# Patient Record
Sex: Male | Born: 1965 | Race: White | Hispanic: No | Marital: Married | Smoking: Never smoker
Health system: Southern US, Community
[De-identification: ages and names within clinical notes are randomized; demographics above are authoritative.]

## PROBLEM LIST (undated history)

## (undated) DIAGNOSIS — I10 Essential (primary) hypertension: Secondary | ICD-10-CM

## (undated) DIAGNOSIS — M1990 Unspecified osteoarthritis, unspecified site: Secondary | ICD-10-CM

## (undated) HISTORY — PX: KNEE ARTHROSCOPY W/ MENISCAL REPAIR: SHX1877

## (undated) HISTORY — DX: Essential (primary) hypertension: I10

## (undated) HISTORY — DX: Unspecified osteoarthritis, unspecified site: M19.90

---

## 2005-01-25 ENCOUNTER — Ambulatory Visit: Payer: Self-pay | Admitting: Internal Medicine

## 2005-01-28 ENCOUNTER — Ambulatory Visit: Payer: Self-pay

## 2005-02-19 ENCOUNTER — Ambulatory Visit: Payer: Self-pay | Admitting: Internal Medicine

## 2005-06-20 ENCOUNTER — Ambulatory Visit: Payer: Self-pay | Admitting: Internal Medicine

## 2006-12-29 ENCOUNTER — Encounter: Payer: Self-pay | Admitting: *Deleted

## 2006-12-29 DIAGNOSIS — I10 Essential (primary) hypertension: Secondary | ICD-10-CM

## 2008-03-29 ENCOUNTER — Ambulatory Visit: Payer: Self-pay | Admitting: Internal Medicine

## 2008-03-29 LAB — CONVERTED CEMR LAB
ALT: 97 units/L — ABNORMAL HIGH (ref 0–53)
AST: 55 units/L — ABNORMAL HIGH (ref 0–37)
Basophils Absolute: 0 10*3/uL (ref 0.0–0.1)
Bilirubin, Direct: 0.2 mg/dL (ref 0.0–0.3)
Calcium: 9.9 mg/dL (ref 8.4–10.5)
Chloride: 102 meq/L (ref 96–112)
Cholesterol: 199 mg/dL (ref 0–200)
Eosinophils Absolute: 0.1 10*3/uL (ref 0.0–0.7)
GFR calc Af Amer: 94 mL/min
GFR calc non Af Amer: 78 mL/min
HCT: 45.9 % (ref 39.0–52.0)
HDL: 35.4 mg/dL — ABNORMAL LOW (ref >39.0)
Hemoglobin: 15.7 g/dL (ref 13.0–17.0)
Leukocytes, UA: NEGATIVE
Lymphocytes Relative: 29.1 % (ref 12.0–46.0)
MCHC: 34.2 g/dL (ref 30.0–36.0)
MCV: 87.3 fL (ref 78.0–100.0)
Monocytes Relative: 8.9 % (ref 3.0–12.0)
Neutro Abs: 4.7 10*3/uL (ref 1.4–7.7)
Nitrite: NEGATIVE
Platelets: 285 10*3/uL (ref 150–400)
RDW: 12.5 % (ref 11.5–14.6)
Specific Gravity, Urine: 1.02 (ref 1.000–1.03)
TSH: 2.19 microintl units/mL (ref 0.35–5.50)
Total Bilirubin: 1.2 mg/dL (ref 0.3–1.2)
Total CHOL/HDL Ratio: 5.6
Triglycerides: 136 mg/dL (ref 0–149)

## 2008-04-04 ENCOUNTER — Ambulatory Visit: Payer: Self-pay | Admitting: Internal Medicine

## 2008-04-04 DIAGNOSIS — M1990 Unspecified osteoarthritis, unspecified site: Secondary | ICD-10-CM | POA: Insufficient documentation

## 2008-04-04 DIAGNOSIS — R945 Abnormal results of liver function studies: Secondary | ICD-10-CM | POA: Insufficient documentation

## 2008-04-04 DIAGNOSIS — M25569 Pain in unspecified knee: Secondary | ICD-10-CM | POA: Insufficient documentation

## 2008-04-04 DIAGNOSIS — E785 Hyperlipidemia, unspecified: Secondary | ICD-10-CM | POA: Insufficient documentation

## 2008-04-04 DIAGNOSIS — R7309 Other abnormal glucose: Secondary | ICD-10-CM

## 2008-04-04 DIAGNOSIS — K219 Gastro-esophageal reflux disease without esophagitis: Secondary | ICD-10-CM | POA: Insufficient documentation

## 2008-08-01 ENCOUNTER — Ambulatory Visit: Payer: Self-pay | Admitting: Internal Medicine

## 2008-08-01 LAB — CONVERTED CEMR LAB
Alkaline Phosphatase: 52 units/L (ref 39–117)
Bilirubin, Direct: 0.1 mg/dL (ref 0.0–0.3)
Calcium: 8.9 mg/dL (ref 8.4–10.5)
Creatinine, Ser: 1 mg/dL (ref 0.4–1.5)
Direct LDL: 164.4 mg/dL
GFR calc non Af Amer: 86.4 mL/min (ref >60)
Hgb A1c MFr Bld: 6 % (ref 4.6–6.5)
Sodium: 141 meq/L (ref 135–145)
Total Protein: 7.1 g/dL (ref 6.0–8.3)
Triglycerides: 203 mg/dL — ABNORMAL HIGH (ref 0.0–149.0)
VLDL: 40.6 mg/dL — ABNORMAL HIGH (ref 0.0–40.0)

## 2008-08-05 ENCOUNTER — Ambulatory Visit: Payer: Self-pay | Admitting: Internal Medicine

## 2009-02-16 ENCOUNTER — Ambulatory Visit: Payer: Self-pay | Admitting: Internal Medicine

## 2009-02-16 LAB — CONVERTED CEMR LAB
AST: 46 units/L — ABNORMAL HIGH (ref 0–37)
Alkaline Phosphatase: 70 units/L (ref 39–117)
Calcium: 9.6 mg/dL (ref 8.4–10.5)
Chloride: 102 meq/L (ref 96–112)
HDL: 34.5 mg/dL — ABNORMAL LOW (ref >39.00)
Total CHOL/HDL Ratio: 8
Triglycerides: 223 mg/dL — ABNORMAL HIGH (ref 0.0–149.0)

## 2009-02-21 ENCOUNTER — Ambulatory Visit: Payer: Self-pay | Admitting: Internal Medicine

## 2009-02-24 ENCOUNTER — Encounter: Admission: RE | Admit: 2009-02-24 | Discharge: 2009-02-24 | Payer: Self-pay | Admitting: Internal Medicine

## 2009-03-02 ENCOUNTER — Encounter: Payer: Self-pay | Admitting: Internal Medicine

## 2009-08-01 ENCOUNTER — Ambulatory Visit: Payer: Self-pay | Admitting: Internal Medicine

## 2009-08-01 LAB — CONVERTED CEMR LAB
ALT: 39 units/L (ref 0–53)
AST: 21 units/L (ref 0–37)
Albumin: 4 g/dL (ref 3.5–5.2)
Alkaline Phosphatase: 66 units/L (ref 39–117)
BUN: 12 mg/dL (ref 6–23)
Bilirubin, Direct: 0.1 mg/dL (ref 0.0–0.3)
Creatinine, Ser: 1.1 mg/dL (ref 0.4–1.5)
Direct LDL: 142.7 mg/dL
Ferritin: 119.4 ng/mL (ref 22.0–322.0)
HCV Ab: NEGATIVE
HDL: 41.5 mg/dL (ref >39.00)
Hep A IgM: NEGATIVE
Hepatitis B Surface Ag: NEGATIVE
Hgb A1c MFr Bld: 6.1 % (ref 4.6–6.5)
Saturation Ratios: 23.7 % (ref 20.0–50.0)
Sodium: 139 meq/L (ref 135–145)
Total Bilirubin: 0.8 mg/dL (ref 0.3–1.2)
Transferrin: 234.9 mg/dL (ref 212.0–360.0)
Triglycerides: 205 mg/dL — ABNORMAL HIGH (ref 0.0–149.0)

## 2009-08-03 ENCOUNTER — Ambulatory Visit: Payer: Self-pay | Admitting: Internal Medicine

## 2010-04-30 ENCOUNTER — Ambulatory Visit: Payer: Self-pay | Admitting: Internal Medicine

## 2010-04-30 LAB — CONVERTED CEMR LAB
ALT: 46 units/L (ref 0–53)
AST: 27 units/L (ref 0–37)
Albumin: 4.2 g/dL (ref 3.5–5.2)
Alkaline Phosphatase: 66 units/L (ref 39–117)
BUN: 12 mg/dL (ref 6–23)
Basophils Absolute: 0 10*3/uL (ref 0.0–0.1)
Basophils Relative: 0.2 % (ref 0.0–3.0)
Bilirubin Urine: NEGATIVE
Bilirubin, Direct: 0.1 mg/dL (ref 0.0–0.3)
Blood, UA: NEGATIVE
CO2: 26 meq/L (ref 19–32)
Calcium: 9.5 mg/dL (ref 8.4–10.5)
Chloride: 102 meq/L (ref 96–112)
Cholesterol: 221 mg/dL — ABNORMAL HIGH (ref 0–200)
Creatinine, Ser: 1.1 mg/dL (ref 0.4–1.5)
Direct LDL: 166 mg/dL
Eosinophils Absolute: 0.1 10*3/uL (ref 0.0–0.7)
Eosinophils Relative: 0.8 % (ref 0.0–5.0)
GFR calc non Af Amer: 77.6 mL/min (ref >60.00)
Glucose, Bld: 90 mg/dL (ref 70–99)
HCT: 45.7 % (ref 39.0–52.0)
HDL: 41.5 mg/dL (ref >39.00)
Hemoglobin: 15.7 g/dL (ref 13.0–17.0)
Ketones, ur: NEGATIVE mg/dL
Leukocytes, UA: NEGATIVE
Lymphocytes Relative: 19.6 % (ref 12.0–46.0)
Lymphs Abs: 1.7 10*3/uL (ref 0.7–4.0)
MCHC: 34.4 g/dL (ref 30.0–36.0)
MCV: 88.6 fL (ref 78.0–100.0)
Monocytes Absolute: 0.7 10*3/uL (ref 0.1–1.0)
Monocytes Relative: 7.9 % (ref 3.0–12.0)
Neutro Abs: 6.3 10*3/uL (ref 1.4–7.7)
Neutrophils Relative %: 71.5 % (ref 43.0–77.0)
Nitrite: NEGATIVE
PSA: 0.74 ng/mL (ref 0.10–4.00)
Platelets: 276 10*3/uL (ref 150.0–400.0)
Potassium: 4.8 meq/L (ref 3.5–5.1)
RBC: 5.16 M/uL (ref 4.22–5.81)
RDW: 13.4 % (ref 11.5–14.6)
Sodium: 138 meq/L (ref 135–145)
Specific Gravity, Urine: 1.02 (ref 1.000–1.030)
TSH: 1.47 microintl units/mL (ref 0.35–5.50)
Total Bilirubin: 0.6 mg/dL (ref 0.3–1.2)
Total CHOL/HDL Ratio: 5
Total Protein, Urine: NEGATIVE mg/dL
Total Protein: 7.4 g/dL (ref 6.0–8.3)
Triglycerides: 136 mg/dL (ref 0.0–149.0)
Urine Glucose: NEGATIVE mg/dL
Urobilinogen, UA: 0.2 (ref 0.0–1.0)
VLDL: 27.2 mg/dL (ref 0.0–40.0)
WBC: 8.9 10*3/uL (ref 4.5–10.5)
pH: 6 (ref 5.0–8.0)

## 2010-05-04 ENCOUNTER — Ambulatory Visit: Payer: Self-pay | Admitting: Internal Medicine

## 2010-05-04 ENCOUNTER — Encounter: Payer: Self-pay | Admitting: Internal Medicine

## 2010-05-04 DIAGNOSIS — D485 Neoplasm of uncertain behavior of skin: Secondary | ICD-10-CM | POA: Insufficient documentation

## 2010-05-04 DIAGNOSIS — L570 Actinic keratosis: Secondary | ICD-10-CM

## 2010-06-03 ENCOUNTER — Encounter: Payer: Self-pay | Admitting: Orthopedic Surgery

## 2010-06-12 NOTE — Assessment & Plan Note (Signed)
Summary: f/u appt/cd   Vital Signs:  Patient profile:   45 year old male Height:      68 inches Weight:      229 pounds BMI:     34.95 Temp:     97.2 degrees F oral Pulse rate:   83 / minute BP sitting:   132 / 90  (left arm)  Vitals Entered By: Tora Perches (August 03, 2009 7:56 AM) CC: f/u Is Patient Diabetic? No   CC:  f/u.  History of Present Illness: The patient presents for a follow up of hypertension, OA, hyperlipidemia   Preventive Screening-Counseling & Management  Alcohol-Tobacco     Smoking Status: never  Current Medications (verified): 1)  Lisinopril 40 Mg  Tabs (Lisinopril) .... Once Daily 2)  Crestor 20 Mg  Tabs (Rosuvastatin Calcium) .... Once Daily 3)  Prevacid 30 Mg  Cpdr (Lansoprazole) .... Once Daily 4)  Vitamin D3 1000 Unit  Tabs (Cholecalciferol) .Marland Kitchen.. 1 By Mouth Daily 5)  Nabumetone 750 Mg  Tabs (Nabumetone) .Marland Kitchen.. 1 By Mouth Two Times A Day Pc Prn  Allergies: 1)  Tramadol Hcl (Tramadol Hcl)  Past History:  Past Medical History: Last updated: 02/21/2009  Hypertension Knee Pain Hyperlipidemia Osteoarthritis B knee GERD Elev LFTs 2010  Social History: Last updated: 02/21/2009 Occupation: retiring from [employer] in 2010 Never Smoked Alcohol use-no Regular exercise-weekly  Review of Systems  The patient denies fever, chest pain, and abdominal pain.         working a lot  Physical Exam  General:  Well-developed,well-nourished,in no acute distress; alert,appropriate and cooperative throughout examination Nose:  External nasal examination shows no deformity or inflammation. Nasal mucosa are pink and moist without lesions or exudates. Mouth:  Oral mucosa and oropharynx without lesions or exudates.  Teeth in good repair. Lungs:  Normal respiratory effort, chest expands symmetrically. Lungs are clear to auscultation, no crackles or wheezes. Heart:  Normal rate and regular rhythm. S1 and S2 normal without gallop, murmur, click, rub or  other extra sounds. Abdomen:  Bowel sounds positive,abdomen soft and non-tender without masses, organomegaly or hernias noted. Msk:  No deformity or scoliosis noted of thoracic or lumbar spine.  B knees less  tender w/ROM Extremities:  No clubbing, cyanosis, edema, or deformity noted with normal full range of motion of all joints.   Neurologic:  No cranial nerve deficits noted. Station and gait are normal. Plantar reflexes are down-going bilaterally. DTRs are symmetrical throughout. Sensory, motor and coordinative functions appear intact. Skin:  Intact without suspicious lesions or rashes Psych:  Cognition and judgment appear intact. Alert and cooperative with normal attention span and concentration. No apparent delusions, illusions, hallucinations   Impression & Recommendations:  Problem # 1:  HYPERTENSION (ICD-401.9) Assessment Improved  His updated medication list for this problem includes:    Lisinopril 40 Mg Tabs (Lisinopril) ..... Once daily  Problem # 2:  OSTEOARTHRITIS (ICD-715.90) Assessment: Unchanged  His updated medication list for this problem includes:    Nabumetone 750 Mg Tabs (Nabumetone) .Marland Kitchen... 1 by mouth two times a day pc prn  Problem # 3:  GERD (ICD-530.81) Assessment: Deteriorated  His updated medication list for this problem includes:    Prevacid 30 Mg Cpdr (Lansoprazole) ..... Once daily  Problem # 4:  HYPERLIPIDEMIA (ICD-272.4) Assessment: Improved  His updated medication list for this problem includes:    Crestor 20 Mg Tabs (Rosuvastatin calcium) ..... Once daily  Problem # 5:  ABNORMAL LIVER FUNCTION TESTS (ICD-794.8) Assessment: Improved  The labs were reviewed with the patient.   Complete Medication List: 1)  Lisinopril 40 Mg Tabs (Lisinopril) .... Once daily 2)  Crestor 20 Mg Tabs (Rosuvastatin calcium) .... Once daily 3)  Prevacid 30 Mg Cpdr (Lansoprazole) .... Once daily 4)  Vitamin D3 1000 Unit Tabs (Cholecalciferol) .Marland Kitchen.. 1 by mouth daily 5)   Nabumetone 750 Mg Tabs (Nabumetone) .Marland Kitchen.. 1 by mouth two times a day pc prn  Patient Instructions: 1)  Please schedule a follow-up appointment in 6 months well w/labs.

## 2010-06-14 NOTE — Assessment & Plan Note (Signed)
Summary: Cpx/#cd   Vital Signs:  Patient profile:   45 year old male Height:      68 inches Weight:      225 pounds BMI:     34.33 Temp:     98.0 degrees F oral Pulse rate:   80 / minute Pulse rhythm:   regular Resp:     16 per minute BP sitting:   134 / 90  (left arm) Cuff size:   regular  Vitals Entered By: Lanier Prude, CMA(AAMA) (May 04, 2010 9:30 AM) CC: CPX Is Patient Diabetic? No   CC:  CPX.  History of Present Illness: The patient presents for a preventive health examination  Ran out of med 2 d ago  Current Medications (verified): 1)  Lisinopril 40 Mg  Tabs (Lisinopril) .... Once Daily 2)  Crestor 20 Mg  Tabs (Rosuvastatin Calcium) .... Once Daily 3)  Prevacid 30 Mg  Cpdr (Lansoprazole) .... Once Daily 4)  Vitamin D3 1000 Unit  Tabs (Cholecalciferol) .Marland Kitchen.. 1 By Mouth Daily 5)  Nabumetone 750 Mg  Tabs (Nabumetone) .Marland Kitchen.. 1 By Mouth Two Times A Day Pc Prn  Allergies (verified): 1)  Tramadol Hcl (Tramadol Hcl)  Past History:  Past Medical History: Last updated: 02/21/2009  Hypertension Knee Pain Hyperlipidemia Osteoarthritis B knee GERD Elev LFTs 2010  Past Surgical History: Last updated: 04/04/2008 L knee arthrosc.  Social History: Last updated: 02/21/2009 Occupation: retiring from [employer] in 2010 Never Smoked Alcohol use-no Regular exercise-weekly  Physical Exam  General:  Well-developed,well-nourished,in no acute distress; alert,appropriate and cooperative throughout examination Head:  Normocephalic and atraumatic without obvious abnormalities. No apparent alopecia or balding. Eyes:  No corneal or conjunctival inflammation noted. EOMI. Perrla. Ears:  External ear exam shows no significant lesions or deformities.  Otoscopic examination reveals clear canals, tympanic membranes are intact bilaterally without bulging, retraction, inflammation or discharge. Hearing is grossly normal bilaterally. Nose:  External nasal examination shows no  deformity or inflammation. Nasal mucosa are pink and moist without lesions or exudates. Mouth:  Oral mucosa and oropharynx without lesions or exudates.  Teeth in good repair. Neck:  No deformities, masses, or tenderness noted. Chest Wall:  No deformities, masses, tenderness or gynecomastia noted. Lungs:  Normal respiratory effort, chest expands symmetrically. Lungs are clear to auscultation, no crackles or wheezes. Heart:  Normal rate and regular rhythm. S1 and S2 normal without gallop, murmur, click, rub or other extra sounds. Abdomen:  Bowel sounds positive,abdomen soft and non-tender without masses, organomegaly or hernias noted. Genitalia:  self nl exam Msk:  No deformity or scoliosis noted of thoracic or lumbar spine.  B knees NT w/ROM Pulses:  R and L carotid,radial,femoral,dorsalis pedis and posterior tibial pulses are full and equal bilaterally Extremities:  No clubbing, cyanosis, edema, or deformity noted with normal full range of motion of all joints.   Neurologic:  No cranial nerve deficits noted. Station and gait are normal. Plantar reflexes are down-going bilaterally. DTRs are symmetrical throughout. Sensory, motor and coordinative functions appear intact. Skin:  AKs on face Moles on upper body w/irreg color Cervical Nodes:  No lymphadenopathy noted Inguinal Nodes:  No significant adenopathy Psych:  Cognition and judgment appear intact. Alert and cooperative with normal attention span and concentration. No apparent delusions, illusions, hallucinations   Impression & Recommendations:  Problem # 1:  ROUTINE GENERAL MEDICAL EXAM@HEALTH  CARE FACL (ICD-V70.0) Assessment New Health and age related issues were discussed. Available screening tests and vaccinations were discussed as well. Healthy life style including  good diet and exercise was discussed.  The labs were reviewed with the patient.  Orders: EKG w/ Interpretation (93000)ok  Problem # 2:  NEOPLASM OF UNCERTAIN BEHAVIOR OF  SKIN (ICD-238.2) Assessment: New Skin biopsy   Problem # 3:  ACTINIC KERATOSIS (ICD-702.0) Assessment: New  Procedure: cryo Indication: AK(s) Risks incl. scar(s), incomplete removal, ect.  and benefits discussed     3  lesion(s): 2 on forehead and one on R cheek was/were treated with liqid N2 in usual fasion.  Tolerated well. Compl. none. Wound care instructions given.  Orders: Cryotherapy/Destruction benign or premalignant lesion (1st lesion)  (17000) Cryotherapy/Destruction benign or premalignant lesion (2nd-14th lesions) (17003)  Complete Medication List: 1)  Lisinopril 40 Mg Tabs (Lisinopril) .... Once daily 2)  Crestor 20 Mg Tabs (Rosuvastatin calcium) .... Once daily 3)  Prevacid 30 Mg Cpdr (Lansoprazole) .... Once daily 4)  Vitamin D3 1000 Unit Tabs (Cholecalciferol) .Marland Kitchen.. 1 by mouth daily 5)  Nabumetone 750 Mg Tabs (Nabumetone) .Marland Kitchen.. 1 by mouth two times a day pc prn  Patient Instructions: 1)  Please schedule a follow-up appointment in 1 year well w/labs. 2)  Skin biopsy w/me in 2 months  nPrescriptions: PREVACID 30 MG  CPDR (LANSOPRAZOLE) once daily  #90 x 3   Entered and Authorized by:   Tresa Garter MD   Signed by:   Tresa Garter MD on 05/04/2010   Method used:   Electronically to        [facility] [phone]* (retail)       [address]       [place], [place]  [address]       Ph: [phone]       Fax: [phone]   RxID:   [id number] CRESTOR 20 MG  TABS (ROSUVASTATIN CALCIUM) once daily  #90 x 3   Entered and Authorized by:   Tresa Garter MD   Signed by:   Tresa Garter MD on 05/04/2010   Method used:   Electronically to        [facility]. [phone]* (retail)       [address]       [place], [place]  [address]       Ph: [phone]       Fax: [phone]   RxID:   [id number] LISINOPRIL 40 MG  TABS (LISINOPRIL) once daily  #90 x 3   Entered and  Authorized by:   Tresa Garter MD   Signed by:   Tresa Garter MD on 05/04/2010   Method used:   Electronically to        [facility] [phone]* (retail)       [address]       [place], [place]  [address]       Ph: [phone]       Fax: [phone]   RxID:   [id number]    Orders Added: 1)  EKG w/ Interpretation [93000] 2)  Est. Patient 40-64 years [99396] 3)  Cryotherapy/Destruction benign or premalignant lesion (1st lesion)  [17000] 4)  Cryotherapy/Destruction benign or premalignant lesion (2nd-14th lesions) [17003]    Contraindications/Deferment of Procedures/Staging:    Test/Procedure: FLU VAX    Reason for deferment: patient declined

## 2010-07-04 ENCOUNTER — Encounter: Payer: Self-pay | Admitting: Internal Medicine

## 2010-07-04 ENCOUNTER — Other Ambulatory Visit: Payer: Self-pay | Admitting: Internal Medicine

## 2010-07-04 ENCOUNTER — Ambulatory Visit (INDEPENDENT_AMBULATORY_CARE_PROVIDER_SITE_OTHER): Payer: BC Managed Care – PPO | Admitting: Internal Medicine

## 2010-07-04 DIAGNOSIS — D485 Neoplasm of uncertain behavior of skin: Secondary | ICD-10-CM

## 2010-07-10 NOTE — Assessment & Plan Note (Signed)
Summary: SKIN/BX/NWS #   Vital Signs:  Patient profile:   45 year old male Height:      68 inches Weight:      220 pounds BMI:     33.57 Temp:     98.6 degrees F oral Pulse rate:   88 / minute Pulse rhythm:   regular Resp:     16 per minute BP sitting:   112 / 80  (left arm) Cuff size:   regular  Vitals Entered By: Lanier Prude, CMA(AAMA) (July 04, 2010 9:43 AM)  Procedure Note  Mole Biopsy/Removal: The patient complains of changing mole. Indication: changing lesion Consent signed: yes  Procedure # 1: shave biopsy    Size (in cm): 0.6 x 0.6    Region: upper    Location: R mid-trap    Comment: Risks including but not limited by incomplete procedure, bleeding, infection, recurrence were discussed with the patient. Consent form was signed. Tolerated well. Complicatons - none.     Instrument used: dermablade    Anesthesia: 0.5 ml 1% lidocaine w/epinephrine  Procedure # 2: shave biopsy    Size (in cm): 0.3 x 0.3    Region: anterior    Location: R chest    Comment: removed identically #3 0.6x0.6 mm growth on L dist lat shin    Instrument used: same    Anesthesia: 0.5 ml 1% lidocaine w/epinephrine  Cleaned and prepped with: alcohol and betadine Wound dressing: neosporin Instructions: daily dressing changes  CC: multiple shave skin biopsies Is Patient Diabetic? No   CC:  multiple shave skin biopsies.  History of Present Illness: Here for skin biopsy   Current Medications (verified): 1)  Lisinopril 40 Mg  Tabs (Lisinopril) .... Once Daily 2)  Crestor 20 Mg  Tabs (Rosuvastatin Calcium) .... Once Daily 3)  Prevacid 30 Mg  Cpdr (Lansoprazole) .... Once Daily 4)  Vitamin D3 1000 Unit  Tabs (Cholecalciferol) .Marland Kitchen.. 1 By Mouth Daily 5)  Nabumetone 750 Mg  Tabs (Nabumetone) .Marland Kitchen.. 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 Once daily 2)  Crestor 20 Mg Tabs (Rosuvastatin calcium) .... Once daily 3)  Prevacid 30 Mg Cpdr (Lansoprazole) .... Once daily 4)  Vitamin D3 1000 Unit Tabs (Cholecalciferol) .Marland Kitchen.. 1 by mouth daily 5)  Nabumetone 750 Mg Tabs (Nabumetone) .Marland Kitchen.. 1 by mouth two times a day pc prn   Orders Added: 1)  Shave Skin Lesion 0.6-1.0 cm/trunk/arm/leg [11301] 2)  Shave Skin Lesion 0.6-1.0 cm/trunk/arm/leg [11301] 3)  Shave Skin Lesion < 0.5 cm/trunk/arm/leg [11300]

## 2010-07-10 NOTE — Miscellaneous (Signed)
Summary: Consent for Skin BX / [facility]  Consent for Skin BX / [facility]   Imported By: Lennie Odor 07/05/2010 14:21:25  _____________________________________________________________________  External Attachment:    Type:   Image     Comment:   External Document

## 2010-12-04 ENCOUNTER — Telehealth: Payer: Self-pay | Admitting: *Deleted

## 2010-12-04 NOTE — Telephone Encounter (Signed)
Pt is requesting something for sleep. Please advise

## 2010-12-04 NOTE — Telephone Encounter (Signed)
Use Tylenol PM or Valerian root prn. OV if problems Thx

## 2010-12-05 NOTE — Telephone Encounter (Signed)
Informed pt .

## 2011-06-01 ENCOUNTER — Other Ambulatory Visit: Payer: Self-pay | Admitting: Internal Medicine

## 2012-02-26 ENCOUNTER — Telehealth: Payer: Self-pay | Admitting: Internal Medicine

## 2012-02-26 DIAGNOSIS — M25579 Pain in unspecified ankle and joints of unspecified foot: Secondary | ICD-10-CM

## 2012-02-26 NOTE — Telephone Encounter (Signed)
The patient called and is hoping to get a referral to an orthopedic specialist regarding a ankle injury.  Patient's callback is [phone]

## 2012-02-27 NOTE — Telephone Encounter (Signed)
Ok Pls go ahead [employer]

## 2012-03-10 ENCOUNTER — Other Ambulatory Visit: Payer: Self-pay | Admitting: *Deleted

## 2012-03-10 MED ORDER — LISINOPRIL 40 MG PO TABS: ORAL_TABLET | ORAL | Status: DC

## 2012-03-10 NOTE — Telephone Encounter (Signed)
R'cd fax from [facility] for refill of Lisinopril.

## 2012-04-06 ENCOUNTER — Other Ambulatory Visit: Payer: Self-pay | Admitting: Internal Medicine

## 2012-04-23 ENCOUNTER — Other Ambulatory Visit (INDEPENDENT_AMBULATORY_CARE_PROVIDER_SITE_OTHER): Payer: BC Managed Care – PPO

## 2012-04-23 ENCOUNTER — Ambulatory Visit (INDEPENDENT_AMBULATORY_CARE_PROVIDER_SITE_OTHER): Payer: BC Managed Care – PPO | Admitting: Internal Medicine

## 2012-04-23 ENCOUNTER — Encounter: Payer: Self-pay | Admitting: Internal Medicine

## 2012-04-23 VITALS — BP 122/90 | HR 96 | Temp 97.0°F | Resp 16 | Ht 69.0 in | Wt 236.0 lb

## 2012-04-23 DIAGNOSIS — I10 Essential (primary) hypertension: Secondary | ICD-10-CM

## 2012-04-23 DIAGNOSIS — Z0000 Encounter for general adult medical examination without abnormal findings: Secondary | ICD-10-CM | POA: Insufficient documentation

## 2012-04-23 DIAGNOSIS — E785 Hyperlipidemia, unspecified: Secondary | ICD-10-CM

## 2012-04-23 DIAGNOSIS — R7309 Other abnormal glucose: Secondary | ICD-10-CM

## 2012-04-23 LAB — URINALYSIS
Leukocytes, UA: NEGATIVE
Nitrite: NEGATIVE
Total Protein, Urine: NEGATIVE
Urine Glucose: NEGATIVE

## 2012-04-23 LAB — HEPATIC FUNCTION PANEL
ALT: 89 U/L — ABNORMAL HIGH (ref 0–53)
AST: 40 U/L — ABNORMAL HIGH (ref 0–37)
Albumin: 4.3 g/dL (ref 3.5–5.2)
Alkaline Phosphatase: 70 U/L (ref 39–117)

## 2012-04-23 LAB — CBC WITH DIFFERENTIAL/PLATELET
Basophils Absolute: 0 10*3/uL (ref 0.0–0.1)
Basophils Relative: 0.3 % (ref 0.0–3.0)
Eosinophils Absolute: 0.4 10*3/uL (ref 0.0–0.7)
HCT: 46.1 % (ref 39.0–52.0)
Lymphs Abs: 2.6 10*3/uL (ref 0.7–4.0)
MCV: 87.2 fl (ref 78.0–100.0)
Neutro Abs: 5.4 10*3/uL (ref 1.4–7.7)
Neutrophils Relative %: 57.3 % (ref 43.0–77.0)
Platelets: 267 10*3/uL (ref 150.0–400.0)

## 2012-04-23 LAB — LIPID PANEL
Total CHOL/HDL Ratio: 8
VLDL: 47.6 mg/dL — ABNORMAL HIGH (ref 0.0–40.0)

## 2012-04-23 LAB — BASIC METABOLIC PANEL
BUN: 11 mg/dL (ref 6–23)
CO2: 28 mEq/L (ref 19–32)

## 2012-04-23 LAB — TSH: TSH: 1.73 u[IU]/mL (ref 0.35–5.50)

## 2012-04-23 LAB — LDL CHOLESTEROL, DIRECT: Direct LDL: 195.8 mg/dL

## 2012-04-23 MED ORDER — VITAMIN D 1000 UNITS PO TABS: 1000.0000 [IU] | ORAL_TABLET | Freq: Every day | ORAL | Status: AC

## 2012-04-23 MED ORDER — LISINOPRIL 40 MG PO TABS: ORAL_TABLET | ORAL | Status: AC

## 2012-04-23 NOTE — Assessment & Plan Note (Signed)
We discussed age appropriate health related issues, including available/recomended screening tests and vaccinations. We discussed a need for adhering to healthy diet and exercise. Labs/EKG were reviewed/ordered. All questions were answered.   

## 2012-04-23 NOTE — Assessment & Plan Note (Signed)
Will check A1c

## 2012-04-23 NOTE — Progress Notes (Signed)
Subjective:    Patient ID: Jesse Stewart, male    DOB: 02-14-1966, 46 y.o.   MRN: [id number]  HPI  The patient is here for a wellness exam. The patient has been doing well overall without major physical or psychological issues going on lately.  The patient needs to address  chronic hypertension that has been well controlled with medicines  BP Readings from Last 3 Encounters:  04/23/12 122/90  07/04/10 112/80  05/04/10 134/90   Wt Readings from Last 3 Encounters:  04/23/12 236 lb (107.049 kg)  07/04/10 220 lb (99.791 kg)  05/04/10 225 lb (102.059 kg)     Review of Systems  Constitutional: Negative for appetite change, fatigue and unexpected weight change.  HENT: Negative for hearing loss, nosebleeds, congestion, sore throat, sneezing, trouble swallowing and neck pain.   Eyes: Negative for itching and visual disturbance.  Respiratory: Negative for cough.   Cardiovascular: Negative for chest pain, palpitations and leg swelling.  Gastrointestinal: Negative for nausea, diarrhea, blood in stool and abdominal distention.  Genitourinary: Negative for frequency and hematuria.  Musculoskeletal: Negative for myalgias, back pain, joint swelling and gait problem.  Skin: Negative for rash and wound.  Neurological: Negative for dizziness, tremors, speech difficulty and weakness.  Psychiatric/Behavioral: Negative for suicidal ideas, sleep disturbance, dysphoric mood and agitation. The patient is not nervous/anxious.        Objective:   Physical Exam  Constitutional: He is oriented to person, place, and time. He appears well-developed and well-nourished. No distress.  HENT:  Head: Normocephalic and atraumatic.  Right Ear: External ear normal.  Left Ear: External ear normal.  Nose: Nose normal.  Mouth/Throat: Oropharynx is clear and moist. No oropharyngeal exudate.  Eyes: Conjunctivae normal and EOM are normal. Pupils are equal, round, and reactive to light. Right eye exhibits no  discharge. Left eye exhibits no discharge. No scleral icterus.  Neck: Normal range of motion. Neck supple. No JVD present. No tracheal deviation present. No thyromegaly present.  Cardiovascular: Normal rate, regular rhythm, normal heart sounds and intact distal pulses.  Exam reveals no gallop and no friction rub.   No murmur heard. Pulmonary/Chest: Effort normal and breath sounds normal. No stridor. No respiratory distress. He has no wheezes. He has no rales. He exhibits no tenderness.  Abdominal: Soft. Bowel sounds are normal. He exhibits no distension and no mass. There is no tenderness. There is no rebound and no guarding.  Genitourinary: Rectum normal, prostate normal and penis normal. Guaiac negative stool. No penile tenderness.  Musculoskeletal: Normal range of motion. He exhibits no edema and no tenderness.  Lymphadenopathy:    He has no cervical adenopathy.  Neurological: He is alert and oriented to person, place, and time. He has normal reflexes. No cranial nerve deficit. He exhibits normal muscle tone. Coordination normal.  Skin: Skin is warm and dry. No rash noted. He is not diaphoretic. No erythema. No pallor.  Psychiatric: He has a normal mood and affect. His behavior is normal. Judgment and thought content normal.    Lab Results  Component Value Date   WBC 8.9 04/30/2010   HGB 15.7 04/30/2010   HCT 45.7 04/30/2010   PLT 276.0 04/30/2010   GLUCOSE 90 04/30/2010   CHOL 221* 04/30/2010   TRIG 136.0 04/30/2010   HDL 41.50 04/30/2010   LDLDIRECT 166.0 04/30/2010   LDLCALC 136* 03/29/2008   ALT 46 04/30/2010   AST 27 04/30/2010   NA 138 04/30/2010   K 4.8 04/30/2010   CL 102  04/30/2010   CREATININE 1.1 04/30/2010   BUN 12 04/30/2010   CO2 26 04/30/2010   TSH 1.47 04/30/2010   PSA 0.74 04/30/2010   HGBA1C 6.1 08/01/2009         Assessment & Plan:

## 2012-04-23 NOTE — Assessment & Plan Note (Signed)
Re-start Rx 

## 2012-04-23 NOTE — Assessment & Plan Note (Signed)
  On diet  

## 2012-06-27 ENCOUNTER — Other Ambulatory Visit: Payer: Self-pay

## 2012-12-07 ENCOUNTER — Encounter: Payer: Self-pay | Admitting: Internal Medicine

## 2012-12-07 ENCOUNTER — Ambulatory Visit (INDEPENDENT_AMBULATORY_CARE_PROVIDER_SITE_OTHER): Payer: BC Managed Care – PPO | Admitting: Internal Medicine

## 2012-12-07 VITALS — BP 128/80 | HR 80 | Temp 97.4°F | Resp 16 | Wt 238.0 lb

## 2012-12-07 DIAGNOSIS — K439 Ventral hernia without obstruction or gangrene: Secondary | ICD-10-CM | POA: Insufficient documentation

## 2012-12-07 DIAGNOSIS — K921 Melena: Secondary | ICD-10-CM

## 2012-12-07 MED ORDER — MELOXICAM 15 MG PO TABS: 15.0000 mg | ORAL_TABLET | Freq: Every day | ORAL | Status: DC | PRN

## 2012-12-07 NOTE — Progress Notes (Signed)
Subjective:    HPI  C/o abd wall protrusion and pain - worse C/o occ heartburn  C/o rectal bleeding off and on 3 months. Last bleed 1 mo ago  The patient needs to address  chronic hypertension that has been well controlled with medicines  BP Readings from Last 3 Encounters:  12/07/12 128/80  04/23/12 122/90  07/04/10 112/80   Wt Readings from Last 3 Encounters:  12/07/12 238 lb (107.956 kg)  04/23/12 236 lb (107.049 kg)  07/04/10 220 lb (99.791 kg)     Review of Systems  Constitutional: Negative for appetite change, fatigue and unexpected weight change.  HENT: Negative for hearing loss, nosebleeds, congestion, sore throat, sneezing, trouble swallowing and neck pain.   Eyes: Negative for itching and visual disturbance.  Respiratory: Negative for cough.   Cardiovascular: Negative for chest pain, palpitations and leg swelling.  Gastrointestinal: Positive for abdominal pain and blood in stool. Negative for nausea, diarrhea, abdominal distention and rectal pain.  Genitourinary: Negative for frequency and hematuria.  Musculoskeletal: Negative for myalgias, back pain, joint swelling and gait problem.  Skin: Negative for rash and wound.  Neurological: Negative for dizziness, tremors, speech difficulty and weakness.  Psychiatric/Behavioral: Negative for suicidal ideas, sleep disturbance, dysphoric mood and agitation. The patient is not nervous/anxious.        Objective:   Physical Exam  Constitutional: He is oriented to person, place, and time. He appears well-developed and well-nourished. No distress.  HENT:  Head: Normocephalic and atraumatic.  Right Ear: External ear normal.  Left Ear: External ear normal.  Nose: Nose normal.  Mouth/Throat: Oropharynx is clear and moist. No oropharyngeal exudate.  Eyes: Conjunctivae and EOM are normal. Pupils are equal, round, and reactive to light. Right eye exhibits no discharge. Left eye exhibits no discharge. No scleral icterus.   Neck: Normal range of motion. Neck supple. No JVD present. No tracheal deviation present. No thyromegaly present.  Cardiovascular: Normal rate, regular rhythm, normal heart sounds and intact distal pulses.  Exam reveals no gallop and no friction rub.   No murmur heard. Pulmonary/Chest: Effort normal and breath sounds normal. No stridor. No respiratory distress. He has no wheezes. He has no rales. He exhibits no tenderness.  Abdominal: Soft. Bowel sounds are normal. He exhibits mass (midline hernia above umbilicus). He exhibits no distension. There is no tenderness. There is no rebound and no guarding.  Genitourinary: Rectum normal and prostate normal. Guaiac negative stool. No penile tenderness.  Musculoskeletal: Normal range of motion. He exhibits no edema and no tenderness.  Lymphadenopathy:    He has no cervical adenopathy.  Neurological: He is alert and oriented to person, place, and time. He has normal reflexes. No cranial nerve deficit. He exhibits normal muscle tone. Coordination normal.  Skin: Skin is warm and dry. No rash noted. He is not diaphoretic. No erythema. No pallor.  Psychiatric: He has a normal mood and affect. His behavior is normal. Judgment and thought content normal.    Lab Results  Component Value Date   WBC 9.5 04/23/2012   HGB 15.7 04/23/2012   HCT 46.1 04/23/2012   PLT 267.0 04/23/2012   GLUCOSE 88 04/23/2012   CHOL 256* 04/23/2012   TRIG 238.0* 04/23/2012   HDL 33.50* 04/23/2012   LDLDIRECT 195.8 04/23/2012   LDLCALC 136* 03/29/2008   ALT 89* 04/23/2012   AST 40* 04/23/2012   NA 134* 04/23/2012   K 3.9 04/23/2012   CL 100 04/23/2012   CREATININE 1.1 04/23/2012  BUN 11 04/23/2012   CO2 28 04/23/2012   TSH 1.73 04/23/2012   PSA 0.74 04/30/2010   HGBA1C 6.3 04/23/2012         Assessment & Plan:

## 2012-12-07 NOTE — Assessment & Plan Note (Signed)
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

## 2012-12-07 NOTE — Assessment & Plan Note (Signed)
Will sch colon 

## 2012-12-08 ENCOUNTER — Encounter: Payer: Self-pay | Admitting: Gastroenterology

## 2012-12-18 ENCOUNTER — Encounter (INDEPENDENT_AMBULATORY_CARE_PROVIDER_SITE_OTHER): Payer: Self-pay | Admitting: General Surgery

## 2012-12-18 ENCOUNTER — Ambulatory Visit (INDEPENDENT_AMBULATORY_CARE_PROVIDER_SITE_OTHER): Payer: BC Managed Care – PPO | Admitting: General Surgery

## 2012-12-18 VITALS — BP 130/80 | HR 88 | Resp 18 | Ht 69.0 in | Wt 241.0 lb

## 2012-12-18 DIAGNOSIS — K439 Ventral hernia without obstruction or gangrene: Secondary | ICD-10-CM

## 2012-12-18 NOTE — Patient Instructions (Addendum)
We will check a CT scan of your abdomen to confirm your abdominal wall anatomy We will call you with the results You do have a belly button hernia; I believe the upper bulge is a hernia as well. It could a diastasis (thinning of muscle where the rectus muscles join together but not a true hernia)

## 2012-12-19 NOTE — Progress Notes (Signed)
Patient ID: Jesse Stewart, male   DOB: [birth year], 47 y.o.   MRN: [id number]  Chief Complaint  Patient presents with  . New Evaluation    eval for abd wall hernia    HPI Jesse Stewart is a 47 y.o. male.   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  The patient reports that he started noticing a bulge in his upper midline about 6-8 months ago. Initially it caused him some intermittent discomfort. Over the past several months he thinks the area has been getting larger. He states that it is tender when he presses on it; Otherwise it really doesn't cause him much pain. He denies any fever, chills, nausea, vomiting, diarrhea or constipation. He denies any prior abdominal surgeries. He states that he has gained a little bit of weight over the past 2 years. 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  Past Medical History  Diagnosis Date  . Hypertension   . Arthritis     Past Surgical History  Procedure Laterality Date  . Knee arthroscopy w/ meniscal repair      Family History  Problem Relation Age of Onset  . Cancer Father     brain    Social History History  Substance Use Topics  . Smoking status: Never Smoker   . Smokeless tobacco: Not on file  . Alcohol Use: Yes    Allergies  Allergen Reactions  . Tramadol Hcl     REACTION: nausea    Current Outpatient Prescriptions  Medication Sig Dispense Refill  . cholecalciferol (VITAMIN D) 1000 UNITS tablet Take 1 tablet (1,000 Units total) by mouth daily.  100 tablet  3  . lisinopril (PRINIVIL,ZESTRIL) 40 MG tablet TAKE 1 TABLET BY MOUTH EVERY DAY  90 tablet  3  . meloxicam (MOBIC) 15 MG tablet Take 1 tablet (15 mg total) by mouth daily as needed for pain.  90 tablet  1   No current facility-administered medications for this visit.    Review of Systems Review of Systems  Constitutional: Negative for fever, chills, appetite change and unexpected weight  change.  HENT: Negative for congestion and trouble swallowing.   Eyes: Negative for visual disturbance.  Respiratory: Negative for chest tightness and shortness of breath.   Cardiovascular: Negative for chest pain and leg swelling.       No PND, no orthopnea, no DOE  Gastrointestinal:       See HPI  Genitourinary: Negative for dysuria and hematuria.  Musculoskeletal: Negative.   Skin: Negative for rash.  Neurological: Negative for seizures and speech difficulty.  Hematological: Does not bruise/bleed easily.  Psychiatric/Behavioral: Negative for behavioral problems and confusion.    Blood pressure 130/80, pulse 88, resp. rate 18, height 5\' 9"  (1.753 m), weight 241 lb (109.317 kg).  Physical Exam Physical Exam  Vitals reviewed. Constitutional: He is oriented to person, place, and time. He appears well-developed and well-nourished. No distress.  obese  HENT:  Head: Normocephalic and atraumatic.  Right Ear: External ear normal.  Left Ear: External ear normal.  Eyes: Conjunctivae are normal. No scleral icterus.  Neck: Normal range of motion. Neck supple. No tracheal deviation present. No thyromegaly present.  Cardiovascular: Normal rate, normal heart sounds and intact distal pulses.   Pulmonary/Chest: Effort normal and breath sounds normal. No respiratory distress. He has no wheezes.  Abdominal: Soft. He exhibits no distension. There is no tenderness. There is no rebound and no  guarding.    Has about 1.5cm umbilical hernia reducible, soft, nt; has upper midline symmetric bulge with situp c/w rectus diastasis; however in mid upper midline there is a deep subcu mass/knot with what feels like a fascial defect that is more vertical/linear than wide. Nontender. Fixed. ?hernia  Musculoskeletal: Normal range of motion. He exhibits no edema and no tenderness.  Lymphadenopathy:    He has no cervical adenopathy.  Neurological: He is alert and oriented to person, place, and time. He exhibits  normal muscle tone.  Skin: Skin is warm and dry. No rash noted. He is not diaphoretic. No erythema. No pallor.  Psychiatric: He has a normal mood and affect. His behavior is normal. Judgment and thought content normal.    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 His upper midline he does have a symmetric bulge consistent with a rectus diastasis. However he does have what appears to be a possible upper midline fascial defect. I can palpate a deep subcutaneous knot in his upper midline with what feels like a surrounding fascial defect. His exam is a little bit limited due to his body habitus. We discussed the finding of umbilical hernia, rectus diastasis, as well as a possible upper midline hernia. We discussed what a hernia was. We also discussed how a rectus diastases is not a true hernia. Because his abdominal wall anatomy is slightly unclear with respect to a questionable hernia in the upper midline separate from his rectus diastases, I recommended getting a CT scan to further delineate his abdominal wall anatomy better. The patient has requested a CT scan later in the month because he is getting ready to get very busy at work. I explained that should be fine since he has minimal symptoms.   We did discuss the importance of weight loss. We did discuss that with any potential hernia repair the increased risk of recurrence when someone is obese.  His followup will be based on the results of his CT scan. All of his questions were asked and answered  Mary Sella. Andrey Campanile, MD, FACS General, Bariatric, & Minimally Invasive Surgery [facility], [place]        [employer] M 12/19/2012, 7:59 AM

## 2012-12-30 ENCOUNTER — Ambulatory Visit: Payer: BC Managed Care – PPO | Admitting: Gastroenterology

## 2012-12-30 ENCOUNTER — Encounter: Payer: Self-pay | Admitting: Gastroenterology

## 2013-01-01 ENCOUNTER — Ambulatory Visit: Payer: BC Managed Care – PPO | Admitting: Gastroenterology

## 2013-01-20 ENCOUNTER — Other Ambulatory Visit: Payer: BC Managed Care – PPO

## 2013-02-16 ENCOUNTER — Ambulatory Visit: Payer: BC Managed Care – PPO | Admitting: Gastroenterology

## 2013-03-18 ENCOUNTER — Other Ambulatory Visit: Payer: Self-pay

## 2013-09-14 ENCOUNTER — Encounter: Payer: Self-pay | Admitting: Internal Medicine

## 2013-09-14 ENCOUNTER — Ambulatory Visit (INDEPENDENT_AMBULATORY_CARE_PROVIDER_SITE_OTHER): Payer: BC Managed Care – PPO | Admitting: Internal Medicine

## 2013-09-14 ENCOUNTER — Other Ambulatory Visit (INDEPENDENT_AMBULATORY_CARE_PROVIDER_SITE_OTHER): Payer: BC Managed Care – PPO

## 2013-09-14 VITALS — BP 118/80 | HR 76 | Temp 98.2°F | Resp 16 | Ht 68.0 in | Wt 207.0 lb

## 2013-09-14 DIAGNOSIS — R7309 Other abnormal glucose: Secondary | ICD-10-CM

## 2013-09-14 DIAGNOSIS — Z0000 Encounter for general adult medical examination without abnormal findings: Secondary | ICD-10-CM

## 2013-09-14 DIAGNOSIS — I10 Essential (primary) hypertension: Secondary | ICD-10-CM

## 2013-09-14 DIAGNOSIS — E785 Hyperlipidemia, unspecified: Secondary | ICD-10-CM

## 2013-09-14 DIAGNOSIS — D485 Neoplasm of uncertain behavior of skin: Secondary | ICD-10-CM

## 2013-09-14 DIAGNOSIS — R945 Abnormal results of liver function studies: Secondary | ICD-10-CM

## 2013-09-14 LAB — HEPATIC FUNCTION PANEL
ALT: 26 U/L (ref 0–53)
AST: 21 U/L (ref 0–37)
Albumin: 4.6 g/dL (ref 3.5–5.2)
Alkaline Phosphatase: 73 U/L (ref 39–117)
BILIRUBIN DIRECT: 0.1 mg/dL (ref 0.0–0.3)
Total Bilirubin: 0.7 mg/dL (ref 0.2–1.2)
Total Protein: 7.6 g/dL (ref 6.0–8.3)

## 2013-09-14 LAB — BASIC METABOLIC PANEL
BUN: 13 mg/dL (ref 6–23)
CHLORIDE: 102 meq/L (ref 96–112)
CO2: 26 mEq/L (ref 19–32)
Calcium: 9.5 mg/dL (ref 8.4–10.5)
Creatinine, Ser: 1.1 mg/dL (ref 0.4–1.5)
GFR: 78.99 mL/min (ref >60.00)
Glucose, Bld: 96 mg/dL (ref 70–99)
Potassium: 4.3 mEq/L (ref 3.5–5.1)
Sodium: 136 mEq/L (ref 135–145)

## 2013-09-14 LAB — CBC WITH DIFFERENTIAL/PLATELET
BASOS ABS: 0 10*3/uL (ref 0.0–0.1)
BASOS PCT: 0.4 % (ref 0.0–3.0)
Eosinophils Absolute: 0.2 10*3/uL (ref 0.0–0.7)
Eosinophils Relative: 2.8 % (ref 0.0–5.0)
HCT: 44.1 % (ref 39.0–52.0)
HEMOGLOBIN: 15 g/dL (ref 13.0–17.0)
LYMPHS PCT: 27.5 % (ref 12.0–46.0)
Lymphs Abs: 2.1 10*3/uL (ref 0.7–4.0)
MCHC: 34.1 g/dL (ref 30.0–36.0)
MCV: 86.6 fl (ref 78.0–100.0)
MONOS PCT: 6.2 % (ref 3.0–12.0)
Monocytes Absolute: 0.5 10*3/uL (ref 0.1–1.0)
NEUTROS PCT: 63.1 % (ref 43.0–77.0)
Neutro Abs: 4.9 10*3/uL (ref 1.4–7.7)
PLATELETS: 268 10*3/uL (ref 150.0–400.0)
RBC: 5.09 Mil/uL (ref 4.22–5.81)
RDW: 13.1 % (ref 11.5–15.5)
WBC: 7.8 10*3/uL (ref 4.0–10.5)

## 2013-09-14 LAB — LIPID PANEL
CHOL/HDL RATIO: 5
Cholesterol: 218 mg/dL — ABNORMAL HIGH (ref 0–200)
HDL: 41.4 mg/dL (ref >39.00)
LDL Cholesterol: 135 mg/dL — ABNORMAL HIGH (ref 0–99)
Triglycerides: 208 mg/dL — ABNORMAL HIGH (ref 0.0–149.0)
VLDL: 41.6 mg/dL — ABNORMAL HIGH (ref 0.0–40.0)

## 2013-09-14 LAB — TSH: TSH: 1.09 u[IU]/mL (ref 0.35–4.50)

## 2013-09-14 LAB — URINALYSIS
Bilirubin Urine: NEGATIVE
Hgb urine dipstick: NEGATIVE
Ketones, ur: NEGATIVE
LEUKOCYTES UA: NEGATIVE
Nitrite: NEGATIVE
Specific Gravity, Urine: 1.01 (ref 1.000–1.030)
Total Protein, Urine: NEGATIVE
URINE GLUCOSE: NEGATIVE
UROBILINOGEN UA: 0.2 (ref 0.0–1.0)
pH: 7 (ref 5.0–8.0)

## 2013-09-14 LAB — HEMOGLOBIN A1C: Hgb A1c MFr Bld: 5.9 % (ref 4.6–6.5)

## 2013-09-14 MED ORDER — MELOXICAM 15 MG PO TABS: 15.0000 mg | ORAL_TABLET | Freq: Every day | ORAL | Status: DC | PRN

## 2013-09-14 NOTE — Assessment & Plan Note (Signed)
Lost wt Re-check

## 2013-09-14 NOTE — Progress Notes (Signed)
Subjective:    HPI  The patient is here for a wellness exam. The patient has been doing well overall without major physical or psychological issues going on lately.  The patient needs to address  chronic hypertension that has been well controlled with medicines  BP Readings from Last 3 Encounters:  09/14/13 118/80  12/18/12 130/80  12/07/12 128/80   Wt Readings from Last 3 Encounters:  09/14/13 207 lb (93.895 kg)  12/18/12 241 lb (109.317 kg)  12/07/12 238 lb (107.956 kg)     Review of Systems  Constitutional: Negative for appetite change, fatigue and unexpected weight change.  HENT: Negative for congestion, hearing loss, nosebleeds, sneezing, sore throat and trouble swallowing.   Eyes: Negative for itching and visual disturbance.  Respiratory: Negative for cough.   Cardiovascular: Negative for chest pain, palpitations and leg swelling.  Gastrointestinal: Negative for nausea, diarrhea, blood in stool and abdominal distention.  Genitourinary: Negative for frequency and hematuria.  Musculoskeletal: Negative for back pain, gait problem, joint swelling, myalgias and neck pain.  Skin: Negative for rash and wound.  Neurological: Negative for dizziness, tremors, speech difficulty and weakness.  Psychiatric/Behavioral: Negative for suicidal ideas, sleep disturbance, dysphoric mood and agitation. The patient is not nervous/anxious.        Objective:   Physical Exam  Constitutional: He is oriented to person, place, and time. He appears well-developed and well-nourished. No distress.  HENT:  Head: Normocephalic and atraumatic.  Right Ear: External ear normal.  Left Ear: External ear normal.  Nose: Nose normal.  Mouth/Throat: Oropharynx is clear and moist. No oropharyngeal exudate.  Eyes: Conjunctivae and EOM are normal. Pupils are equal, round, and reactive to light. Right eye exhibits no discharge. Left eye exhibits no discharge. No scleral icterus.  Neck: Normal range of  motion. Neck supple. No JVD present. No tracheal deviation present. No thyromegaly present.  Cardiovascular: Normal rate, regular rhythm, normal heart sounds and intact distal pulses.  Exam reveals no gallop and no friction rub.   No murmur heard. Pulmonary/Chest: Effort normal and breath sounds normal. No stridor. No respiratory distress. He has no wheezes. He has no rales. He exhibits no tenderness.  Abdominal: Soft. Bowel sounds are normal. He exhibits no distension and no mass. There is no tenderness. There is no rebound and no guarding.  Genitourinary: Rectum normal, prostate normal and penis normal. Guaiac negative stool. No penile tenderness.  Musculoskeletal: Normal range of motion. He exhibits no edema and no tenderness.  Lymphadenopathy:    He has no cervical adenopathy.  Neurological: He is alert and oriented to person, place, and time. He has normal reflexes. No cranial nerve deficit. He exhibits normal muscle tone. Coordination normal.  Skin: Skin is warm and dry. No rash noted. He is not diaphoretic. No erythema. No pallor.  Psychiatric: He has a normal mood and affect. His behavior is normal. Judgment and thought content normal.  B testes nl LS spine and R cheek moles  Lab Results  Component Value Date   WBC 9.5 04/23/2012   HGB 15.7 04/23/2012   HCT 46.1 04/23/2012   PLT 267.0 04/23/2012   GLUCOSE 88 04/23/2012   CHOL 256* 04/23/2012   TRIG 238.0* 04/23/2012   HDL 33.50* 04/23/2012   LDLDIRECT 195.8 04/23/2012   LDLCALC 136* 03/29/2008   ALT 89* 04/23/2012   AST 40* 04/23/2012   NA 134* 04/23/2012   K 3.9 04/23/2012   CL 100 04/23/2012   CREATININE 1.1 04/23/2012   BUN 11 04/23/2012  CO2 28 04/23/2012   TSH 1.73 04/23/2012   PSA 0.74 04/30/2010   HGBA1C 6.3 04/23/2012         Assessment & Plan:

## 2013-09-14 NOTE — Assessment & Plan Note (Signed)
Labs

## 2013-09-14 NOTE — Patient Instructions (Signed)
Wt Readings from Last 3 Encounters:  09/14/13 207 lb (93.895 kg)  12/18/12 241 lb (109.317 kg)  12/07/12 238 lb (107.956 kg)

## 2013-09-14 NOTE — Assessment & Plan Note (Signed)
We discussed age appropriate health related issues, including available/recomended screening tests and vaccinations. We discussed a need for adhering to healthy diet and exercise. Labs/EKG were reviewed/ordered. All questions were answered.   

## 2013-09-14 NOTE — Assessment & Plan Note (Signed)
Continue with current prescription therapy as reflected on the Med list.  

## 2013-09-14 NOTE — Progress Notes (Signed)
Pre visit review using our clinic review tool, if applicable. No additional management support is needed unless otherwise documented below in the visit note. 

## 2013-09-14 NOTE — Assessment & Plan Note (Signed)
5/15 LS spine and R cheek Will bx

## 2013-09-14 NOTE — Assessment & Plan Note (Signed)
Lost wt °

## 2013-09-15 ENCOUNTER — Telehealth: Payer: Self-pay | Admitting: Internal Medicine

## 2013-09-15 NOTE — Telephone Encounter (Signed)
Relevant patient education assigned to patient using Emmi. ° °

## 2013-10-14 ENCOUNTER — Ambulatory Visit (INDEPENDENT_AMBULATORY_CARE_PROVIDER_SITE_OTHER): Payer: BC Managed Care – PPO | Admitting: Internal Medicine

## 2013-10-14 ENCOUNTER — Encounter: Payer: Self-pay | Admitting: Internal Medicine

## 2013-10-14 VITALS — BP 122/80 | HR 80 | Temp 98.2°F | Resp 16 | Wt 208.0 lb

## 2013-10-14 DIAGNOSIS — D485 Neoplasm of uncertain behavior of skin: Secondary | ICD-10-CM

## 2013-10-14 DIAGNOSIS — D489 Neoplasm of uncertain behavior, unspecified: Secondary | ICD-10-CM

## 2013-10-14 NOTE — Progress Notes (Signed)
Patient ID: Jesse Stewart, male   DOB: 01-14-1966, 48 y.o.   MRN: [id number]    Procedure Note :     Procedure :  Skin biopsy   Indication:  Changing mole (s ),  Suspicious lesion(s)   Risks including unsuccessful procedure , bleeding, infection, bruising, scar, a need for another complete procedure and others were explained to the patient in detail as well as the benefits. Informed consent was obtained and signed.   The patient was placed in a decubitus position.  Lesion #1 on  R cheek  measuring  6x6   mm   Skin over lesion #1  was prepped with Betadine and alcohol  and anesthetized with 1/2 cc of 2% lidocaine and epinephrine, using a 25-gauge 1 inch needle.  Shave biopsy with a sterile Dermablade was carried out in the usual fashion. Hyfrecator was used to destroy the rest of the lesion potentially left behind and for hemostasis. Band-Aid was applied with antibiotic ointment.    Lesion #2 on  Lower back   Measuring 8x8   mm   Skin over lesion #2  was prepped with Betadine and alcohol  and anesthetized with 1/2 cc of 2% lidocaine and epinephrine, using a 25-gauge 1 inch needle.  Shave biopsy with a sterile Dermablade was carried out in the usual fashion. Hyfrecator was used to destroy the rest of the lesion potentially left behind and for hemostasis. Band-Aid was applied with antibiotic ointment.  Lesion #3 on R temple near R eye  measuring 1x3  mm   Skin over lesion #3  was prepped with Betadine and alcohol  and anesthetized with 1/8 cc of 2% lidocaine and epinephrine, using a 25-gauge 1 inch needle.  Shave biopsy with a sterile Dermablade was carried out in the usual fashion. Hyfrecator was used to destroy the rest of the lesion potentially left behind and for hemostasis. Band-Aid was applied with antibiotic ointment. #3 was discarded  Tolerated well. Complications none.

## 2013-10-14 NOTE — Assessment & Plan Note (Signed)
See procedure 

## 2013-10-14 NOTE — Progress Notes (Signed)
Pre visit review using our clinic review tool, if applicable. No additional management support is needed unless otherwise documented below in the visit note. 

## 2014-09-16 ENCOUNTER — Encounter: Payer: Self-pay | Admitting: Internal Medicine

## 2014-09-16 ENCOUNTER — Other Ambulatory Visit (INDEPENDENT_AMBULATORY_CARE_PROVIDER_SITE_OTHER): Payer: BC Managed Care – PPO

## 2014-09-16 ENCOUNTER — Ambulatory Visit (INDEPENDENT_AMBULATORY_CARE_PROVIDER_SITE_OTHER): Payer: BC Managed Care – PPO | Admitting: Internal Medicine

## 2014-09-16 VITALS — BP 124/94 | HR 79 | Temp 98.2°F | Resp 16 | Ht 69.0 in | Wt 224.0 lb

## 2014-09-16 DIAGNOSIS — I10 Essential (primary) hypertension: Secondary | ICD-10-CM

## 2014-09-16 DIAGNOSIS — Z0000 Encounter for general adult medical examination without abnormal findings: Secondary | ICD-10-CM

## 2014-09-16 DIAGNOSIS — M174 Other bilateral secondary osteoarthritis of knee: Secondary | ICD-10-CM | POA: Diagnosis not present

## 2014-09-16 DIAGNOSIS — R7989 Other specified abnormal findings of blood chemistry: Secondary | ICD-10-CM

## 2014-09-16 LAB — CBC WITH DIFFERENTIAL/PLATELET
Basophils Absolute: 0 10*3/uL (ref 0.0–0.1)
Basophils Relative: 0.5 % (ref 0.0–3.0)
EOS ABS: 0.2 10*3/uL (ref 0.0–0.7)
Eosinophils Relative: 2.9 % (ref 0.0–5.0)
HEMATOCRIT: 45 % (ref 39.0–52.0)
Hemoglobin: 15.7 g/dL (ref 13.0–17.0)
LYMPHS ABS: 2 10*3/uL (ref 0.7–4.0)
Lymphocytes Relative: 25.1 % (ref 12.0–46.0)
MCHC: 34.8 g/dL (ref 30.0–36.0)
MCV: 86 fl (ref 78.0–100.0)
Monocytes Absolute: 0.7 10*3/uL (ref 0.1–1.0)
Monocytes Relative: 9.5 % (ref 3.0–12.0)
NEUTROS ABS: 4.9 10*3/uL (ref 1.4–7.7)
Neutrophils Relative %: 62 % (ref 43.0–77.0)
Platelets: 294 10*3/uL (ref 150.0–400.0)
RBC: 5.24 Mil/uL (ref 4.22–5.81)
RDW: 13 % (ref 11.5–15.5)
WBC: 7.9 10*3/uL (ref 4.0–10.5)

## 2014-09-16 LAB — HEPATIC FUNCTION PANEL
ALBUMIN: 4.6 g/dL (ref 3.5–5.2)
ALK PHOS: 66 U/L (ref 39–117)
ALT: 36 U/L (ref 0–53)
AST: 20 U/L (ref 0–37)
Bilirubin, Direct: 0.1 mg/dL (ref 0.0–0.3)
Total Bilirubin: 0.7 mg/dL (ref 0.2–1.2)
Total Protein: 8.2 g/dL (ref 6.0–8.3)

## 2014-09-16 LAB — URINALYSIS
BILIRUBIN URINE: NEGATIVE
HGB URINE DIPSTICK: NEGATIVE
KETONES UR: NEGATIVE
Leukocytes, UA: NEGATIVE
Nitrite: NEGATIVE
Specific Gravity, Urine: <=1.005 — AB (ref 1.000–1.030)
TOTAL PROTEIN, URINE-UPE24: NEGATIVE
Urine Glucose: NEGATIVE
Urobilinogen, UA: 0.2 (ref 0.0–1.0)
pH: 6 (ref 5.0–8.0)

## 2014-09-16 LAB — PSA: PSA: 0.58 ng/mL (ref 0.10–4.00)

## 2014-09-16 LAB — TSH: TSH: 1.12 u[IU]/mL (ref 0.35–4.50)

## 2014-09-16 LAB — LIPID PANEL
Cholesterol: 242 mg/dL — ABNORMAL HIGH (ref 0–200)
HDL: 39.9 mg/dL (ref >39.00)
NonHDL: 202.1
Total CHOL/HDL Ratio: 6
Triglycerides: 255 mg/dL — ABNORMAL HIGH (ref 0.0–149.0)
VLDL: 51 mg/dL — ABNORMAL HIGH (ref 0.0–40.0)

## 2014-09-16 LAB — BASIC METABOLIC PANEL
BUN: 16 mg/dL (ref 6–23)
CHLORIDE: 100 meq/L (ref 96–112)
CO2: 26 mEq/L (ref 19–32)
Calcium: 9.9 mg/dL (ref 8.4–10.5)
Creatinine, Ser: 1.04 mg/dL (ref 0.40–1.50)
GFR: 80.41 mL/min (ref >60.00)
Glucose, Bld: 97 mg/dL (ref 70–99)
Potassium: 4.2 mEq/L (ref 3.5–5.1)
SODIUM: 136 meq/L (ref 135–145)

## 2014-09-16 LAB — LDL CHOLESTEROL, DIRECT: Direct LDL: 153 mg/dL

## 2014-09-16 MED ORDER — VITAMIN D 1000 UNITS PO TABS: 1000.0000 [IU] | ORAL_TABLET | Freq: Every day | ORAL | Status: AC

## 2014-09-16 NOTE — Progress Notes (Signed)
Subjective:    HPI  The patient is here for a wellness exam. The patient has been doing well overall without major physical or psychological issues going on lately.  The patient needs to address  chronic hypertension that has been well controlled with medicines  BP Readings from Last 3 Encounters:  09/16/14 124/94  10/14/13 122/80  09/14/13 118/80   Wt Readings from Last 3 Encounters:  09/16/14 224 lb (101.606 kg)  10/14/13 208 lb (94.348 kg)  09/14/13 207 lb (93.895 kg)     Review of Systems  Constitutional: Negative for appetite change, fatigue and unexpected weight change.  HENT: Negative for congestion, hearing loss, nosebleeds, sneezing, sore throat and trouble swallowing.   Eyes: Negative for itching and visual disturbance.  Respiratory: Negative for cough.   Cardiovascular: Negative for chest pain, palpitations and leg swelling.  Gastrointestinal: Negative for nausea, diarrhea, blood in stool and abdominal distention.  Genitourinary: Negative for frequency and hematuria.  Musculoskeletal: Negative for back pain, gait problem, joint swelling, myalgias and neck pain.  Skin: Negative for rash and wound.  Neurological: Negative for dizziness, tremors, speech difficulty and weakness.  Psychiatric/Behavioral: Negative for suicidal ideas, sleep disturbance, dysphoric mood and agitation. The patient is not nervous/anxious.        Objective:   Physical Exam  Constitutional: He is oriented to person, place, and time. He appears well-developed and well-nourished. No distress.  HENT:  Head: Normocephalic and atraumatic.  Right Ear: External ear normal.  Left Ear: External ear normal.  Nose: Nose normal.  Mouth/Throat: Oropharynx is clear and moist. No oropharyngeal exudate.  Eyes: Conjunctivae and EOM are normal. Pupils are equal, round, and reactive to light. Right eye exhibits no discharge. Left eye exhibits no discharge. No scleral icterus.  Neck: Normal range of  motion. Neck supple. No JVD present. No tracheal deviation present. No thyromegaly present.  Cardiovascular: Normal rate, regular rhythm, normal heart sounds and intact distal pulses.  Exam reveals no gallop and no friction rub.   No murmur heard. Pulmonary/Chest: Effort normal and breath sounds normal. No stridor. No respiratory distress. He has no wheezes. He has no rales. He exhibits no tenderness.  Abdominal: Soft. Bowel sounds are normal. He exhibits no distension and no mass. There is no tenderness. There is no rebound and no guarding.  Genitourinary: Rectum normal, prostate normal and penis normal. Guaiac negative stool. No penile tenderness.  Musculoskeletal: Normal range of motion. He exhibits no edema and no tenderness.  Lymphadenopathy:    He has no cervical adenopathy.  Neurological: He is alert and oriented to person, place, and time. He has normal reflexes. No cranial nerve deficit. He exhibits normal muscle tone. Coordination normal.  Skin: Skin is warm and dry. No rash noted. He is not diaphoretic. No erythema. No pallor.  Psychiatric: He has a normal mood and affect. His behavior is normal. Judgment and thought content normal.  B testes nl per pt   Lab Results  Component Value Date   WBC 7.8 09/14/2013   HGB 15.0 09/14/2013   HCT 44.1 09/14/2013   PLT 268.0 09/14/2013   GLUCOSE 96 09/14/2013   CHOL 218* 09/14/2013   TRIG 208.0* 09/14/2013   HDL 41.40 09/14/2013   LDLDIRECT 195.8 04/23/2012   LDLCALC 135* 09/14/2013   ALT 26 09/14/2013   AST 21 09/14/2013   NA 136 09/14/2013   K 4.3 09/14/2013   CL 102 09/14/2013   CREATININE 1.1 09/14/2013   BUN 13 09/14/2013  CO2 26 09/14/2013   TSH 1.09 09/14/2013   PSA 0.74 04/30/2010   HGBA1C 5.9 09/14/2013         Assessment & Plan:

## 2014-09-16 NOTE — Assessment & Plan Note (Signed)
We discussed age appropriate health related issues, including available/recomended screening tests and vaccinations. We discussed a need for adhering to healthy diet and exercise. Labs/EKG were reviewed/ordered. All questions were answered. Loose wt  

## 2014-09-16 NOTE — Assessment & Plan Note (Addendum)
Lisinopril qd Loose wt

## 2014-09-16 NOTE — Patient Instructions (Signed)
Wt Readings from Last 3 Encounters:  09/16/14 224 lb (101.606 kg)  10/14/13 208 lb (94.348 kg)  09/14/13 207 lb (93.895 kg)

## 2014-09-16 NOTE — Assessment & Plan Note (Signed)
Meloxicam prn 

## 2014-10-06 ENCOUNTER — Encounter: Payer: Self-pay | Admitting: Internal Medicine

## 2014-10-06 ENCOUNTER — Other Ambulatory Visit (INDEPENDENT_AMBULATORY_CARE_PROVIDER_SITE_OTHER): Payer: BC Managed Care – PPO

## 2014-10-06 ENCOUNTER — Ambulatory Visit (INDEPENDENT_AMBULATORY_CARE_PROVIDER_SITE_OTHER): Payer: BC Managed Care – PPO | Admitting: Internal Medicine

## 2014-10-06 VITALS — BP 118/82 | HR 79 | Temp 98.0°F | Resp 16 | Ht 69.0 in | Wt 223.0 lb

## 2014-10-06 DIAGNOSIS — R0789 Other chest pain: Secondary | ICD-10-CM

## 2014-10-06 DIAGNOSIS — J069 Acute upper respiratory infection, unspecified: Secondary | ICD-10-CM

## 2014-10-06 DIAGNOSIS — U071 COVID-19: Secondary | ICD-10-CM | POA: Insufficient documentation

## 2014-10-06 DIAGNOSIS — B9789 Other viral agents as the cause of diseases classified elsewhere: Secondary | ICD-10-CM

## 2014-10-06 LAB — CBC WITH DIFFERENTIAL/PLATELET
Basophils Absolute: 0 10*3/uL (ref 0.0–0.1)
Basophils Relative: 0.3 % (ref 0.0–3.0)
EOS ABS: 0.3 10*3/uL (ref 0.0–0.7)
Eosinophils Relative: 2.7 % (ref 0.0–5.0)
HCT: 43.8 % (ref 39.0–52.0)
HEMOGLOBIN: 15.1 g/dL (ref 13.0–17.0)
LYMPHS ABS: 2.7 10*3/uL (ref 0.7–4.0)
Lymphocytes Relative: 25.8 % (ref 12.0–46.0)
MCHC: 34.3 g/dL (ref 30.0–36.0)
MCV: 86.2 fl (ref 78.0–100.0)
MONO ABS: 0.8 10*3/uL (ref 0.1–1.0)
Monocytes Relative: 7.6 % (ref 3.0–12.0)
NEUTROS ABS: 6.6 10*3/uL (ref 1.4–7.7)
NEUTROS PCT: 63.6 % (ref 43.0–77.0)
PLATELETS: 293 10*3/uL (ref 150.0–400.0)
RBC: 5.09 Mil/uL (ref 4.22–5.81)
RDW: 12.9 % (ref 11.5–15.5)
WBC: 10.4 10*3/uL (ref 4.0–10.5)

## 2014-10-06 LAB — CARDIAC PANEL
CK-MB: 2.2 ng/mL (ref 0.3–4.0)
RELATIVE INDEX: 1 calc (ref 0.0–2.5)
Total CK: 225 U/L (ref 7–232)

## 2014-10-06 LAB — TROPONIN I: TNIDX: 0.01 ug/l (ref 0.00–0.06)

## 2014-10-06 MED ORDER — HYDROCODONE-HOMATROPINE 5-1.5 MG/5ML PO SYRP: 5.0000 mL | ORAL_SOLUTION | Freq: Three times a day (TID) | ORAL | Status: DC | PRN

## 2014-10-06 NOTE — Progress Notes (Signed)
Pre visit review using our clinic review tool, if applicable. No additional management support is needed unless otherwise documented below in the visit note. 

## 2014-10-06 NOTE — Progress Notes (Signed)
Subjective:  Patient ID: Jesse Stewart, male    DOB: [birth year]  Age: 49 y.o. MRN: [id number]  CC: Cough   HPI Ephraim Reichel presents for a 5 day history of sore throat, NP cough, chest tightness, and SOB/DOE.  Outpatient Prescriptions Prior to Visit  Medication Sig Dispense Refill  . cholecalciferol (VITAMIN D) 1000 UNITS tablet Take 1 tablet (1,000 Units total) by mouth daily. 100 tablet 3  . lisinopril (PRINIVIL,ZESTRIL) 40 MG tablet TAKE 1 TABLET BY MOUTH EVERY DAY 90 tablet 3  . meloxicam (MOBIC) 15 MG tablet Take 1 tablet (15 mg total) by mouth daily as needed for pain. (Patient not taking: Reported on 09/16/2014) 90 tablet 2   No facility-administered medications prior to visit.    ROS Review of Systems  Constitutional: Negative.  Negative for fever, chills, diaphoresis, activity change, appetite change, fatigue and unexpected weight change.  HENT: Positive for sore throat. Negative for congestion, facial swelling, sinus pressure, tinnitus, trouble swallowing and voice change.   Eyes: Negative.   Respiratory: Positive for cough and shortness of breath. Negative for apnea, choking, chest tightness, wheezing and stridor.   Cardiovascular: Negative.  Negative for chest pain, palpitations and leg swelling.  Gastrointestinal: Negative.  Negative for vomiting, abdominal pain, diarrhea, constipation and blood in stool.  Endocrine: Negative.   Genitourinary: Negative.   Musculoskeletal: Negative.   Skin: Negative.  Negative for rash.  Allergic/Immunologic: Negative.   Neurological: Negative.  Negative for dizziness, tremors, syncope, speech difficulty, weakness, light-headedness and headaches.  Hematological: Negative.  Negative for adenopathy. Does not bruise/bleed easily.  Psychiatric/Behavioral: Negative.     Objective:  BP 118/82 mmHg  Pulse 79  Temp(Src) 98 F (36.7 C) (Oral)  Resp 16  Ht 5\' 9"  (1.753 m)  Wt 223 lb (101.152 kg)  BMI 32.92 kg/m2  SpO2  96%  BP Readings from Last 3 Encounters:  10/06/14 118/82  09/16/14 124/94  10/14/13 122/80    Wt Readings from Last 3 Encounters:  10/06/14 223 lb (101.152 kg)  09/16/14 224 lb (101.606 kg)  10/14/13 208 lb (94.348 kg)    Physical Exam  Constitutional: He is oriented to person, place, and time. He appears well-developed and well-nourished. No distress.  HENT:  Head: Normocephalic and atraumatic.  Nose: Nose normal.  Mouth/Throat: Oropharynx is clear and moist.  Eyes: Conjunctivae are normal. Right eye exhibits no discharge. Left eye exhibits no discharge. No scleral icterus.  Neck: Normal range of motion. Neck supple. No JVD present. No tracheal deviation present. No thyromegaly present.  Cardiovascular: Normal rate, regular rhythm, normal heart sounds and intact distal pulses.  Exam reveals no gallop and no friction rub.   No murmur heard. EKG today  Sinus  Rhythm  -RSR(V1) -nondiagnostic.   PROBABLY NORMAL  Pulmonary/Chest: Effort normal and breath sounds normal. No stridor. No respiratory distress. He has no wheezes. He has no rales. He exhibits no tenderness.  Abdominal: Soft. Bowel sounds are normal. He exhibits no distension and no mass. There is no tenderness. There is no rebound and no guarding.  Musculoskeletal: Normal range of motion. He exhibits no edema or tenderness.  Lymphadenopathy:    He has no cervical adenopathy.  Neurological: He is oriented to person, place, and time.  Skin: Skin is warm and dry. No rash noted. He is not diaphoretic. No erythema. No pallor.  Psychiatric: He has a normal mood and affect. His behavior is normal. Judgment and thought content normal.  Vitals reviewed.  Lab Results  Component Value Date   WBC 7.9 09/16/2014   HGB 15.7 09/16/2014   HCT 45.0 09/16/2014   PLT 294.0 09/16/2014   GLUCOSE 97 09/16/2014   CHOL 242* 09/16/2014   TRIG 255.0* 09/16/2014   HDL 39.90 09/16/2014   LDLDIRECT 153.0 09/16/2014   LDLCALC 135*  09/14/2013   ALT 36 09/16/2014   AST 20 09/16/2014   NA 136 09/16/2014   K 4.2 09/16/2014   CL 100 09/16/2014   CREATININE 1.04 09/16/2014   BUN 16 09/16/2014   CO2 26 09/16/2014   TSH 1.12 09/16/2014   PSA 0.58 09/16/2014   HGBA1C 5.9 09/14/2013    No results found.  Assessment & Plan:   Cephus was seen today for cough.  Diagnoses and all orders for this visit:  Chest tightness - EKG is negative for evidence of ischemia, cardiac enzymes are negative, I chest tightness is related to the URI Orders: -     EKG 12-Lead -     CBC with Differential/Platelet; Future -     Troponin I; Future -     Cardiac panel; Future  Viral URI with cough Orders: -     HYDROcodone-homatropine (HYCODAN) 5-1.5 MG/5ML syrup; Take 5 mLs by mouth every 8 (eight) hours as needed for cough.   I am having Mr. Martorana start on HYDROcodone-homatropine. I am also having him maintain his lisinopril, meloxicam, and cholecalciferol.  Meds ordered this encounter  Medications  . HYDROcodone-homatropine (HYCODAN) 5-1.5 MG/5ML syrup    Sig: Take 5 mLs by mouth every 8 (eight) hours as needed for cough.    Dispense:  120 mL    Refill:  0     Follow-up: Return in about 2 weeks (around 10/20/2014).  Scarlette Calico, MD

## 2014-10-06 NOTE — Patient Instructions (Signed)
Cough, Adult  A cough is a reflex that helps clear your throat and airways. It can help heal the body or may be a reaction to an irritated airway. A cough may only last 2 or 3 weeks (acute) or may last more than 8 weeks (chronic).  CAUSES Acute cough:  Viral or bacterial infections. Chronic cough:  Infections.  Allergies.  Asthma.  Post-nasal drip.  Smoking.  Heartburn or acid reflux.  Some medicines.  Chronic lung problems (COPD).  Cancer. SYMPTOMS   Cough.  Fever.  Chest pain.  Increased breathing rate.  High-pitched whistling sound when breathing (wheezing).  Colored mucus that you cough up (sputum). TREATMENT   A bacterial cough may be treated with antibiotic medicine.  A viral cough must run its course and will not respond to antibiotics.  Your caregiver may recommend other treatments if you have a chronic cough. HOME CARE INSTRUCTIONS   Only take over-the-counter or prescription medicines for pain, discomfort, or fever as directed by your caregiver. Use cough suppressants only as directed by your caregiver.  Use a cold steam vaporizer or humidifier in your bedroom or home to help loosen secretions.  Sleep in a semi-upright position if your cough is worse at night.  Rest as needed.  Stop smoking if you smoke. SEEK IMMEDIATE MEDICAL CARE IF:   You have pus in your sputum.  Your cough starts to worsen.  You cannot control your cough with suppressants and are losing sleep.  You begin coughing up blood.  You have difficulty breathing.  You develop pain which is getting worse or is uncontrolled with medicine.  You have a fever. MAKE SURE YOU:   Understand these instructions.  Will watch your condition.  Will get help right away if you are not doing well or get worse. Document Released: 10/26/2010 Document Revised: 07/22/2011 Document Reviewed: 10/26/2010 [employer] Patient Information 2015 [employer]. This information is not intended  to replace advice given to you by your health care provider. Make sure you discuss any questions you have with your health care provider.  

## 2015-09-18 ENCOUNTER — Other Ambulatory Visit (INDEPENDENT_AMBULATORY_CARE_PROVIDER_SITE_OTHER): Payer: BC Managed Care – PPO

## 2015-09-18 ENCOUNTER — Encounter: Payer: Self-pay | Admitting: Internal Medicine

## 2015-09-18 ENCOUNTER — Ambulatory Visit (INDEPENDENT_AMBULATORY_CARE_PROVIDER_SITE_OTHER): Payer: BC Managed Care – PPO | Admitting: Internal Medicine

## 2015-09-18 ENCOUNTER — Other Ambulatory Visit: Payer: BC Managed Care – PPO

## 2015-09-18 VITALS — BP 118/80 | HR 69 | Ht 69.0 in | Wt 240.0 lb

## 2015-09-18 DIAGNOSIS — I10 Essential (primary) hypertension: Secondary | ICD-10-CM

## 2015-09-18 DIAGNOSIS — E785 Hyperlipidemia, unspecified: Secondary | ICD-10-CM

## 2015-09-18 DIAGNOSIS — Z0000 Encounter for general adult medical examination without abnormal findings: Secondary | ICD-10-CM

## 2015-09-18 DIAGNOSIS — R7989 Other specified abnormal findings of blood chemistry: Secondary | ICD-10-CM | POA: Diagnosis not present

## 2015-09-18 LAB — CBC WITH DIFFERENTIAL/PLATELET
BASOS ABS: 0 10*3/uL (ref 0.0–0.1)
Basophils Relative: 0.3 % (ref 0.0–3.0)
EOS ABS: 0.1 10*3/uL (ref 0.0–0.7)
Eosinophils Relative: 1.6 % (ref 0.0–5.0)
HEMATOCRIT: 43.1 % (ref 39.0–52.0)
HEMOGLOBIN: 15 g/dL (ref 13.0–17.0)
LYMPHS PCT: 26.5 % (ref 12.0–46.0)
Lymphs Abs: 2 10*3/uL (ref 0.7–4.0)
MCHC: 34.8 g/dL (ref 30.0–36.0)
MCV: 86.4 fl (ref 78.0–100.0)
MONO ABS: 0.7 10*3/uL (ref 0.1–1.0)
Monocytes Relative: 8.7 % (ref 3.0–12.0)
Neutro Abs: 4.8 10*3/uL (ref 1.4–7.7)
Neutrophils Relative %: 62.9 % (ref 43.0–77.0)
Platelets: 284 10*3/uL (ref 150.0–400.0)
RBC: 4.99 Mil/uL (ref 4.22–5.81)
RDW: 13.4 % (ref 11.5–15.5)
WBC: 7.6 10*3/uL (ref 4.0–10.5)

## 2015-09-18 LAB — BASIC METABOLIC PANEL
BUN: 13 mg/dL (ref 6–23)
CALCIUM: 9.9 mg/dL (ref 8.4–10.5)
CHLORIDE: 100 meq/L (ref 96–112)
CO2: 28 mEq/L (ref 19–32)
CREATININE: 1.1 mg/dL (ref 0.40–1.50)
GFR: 75.06 mL/min (ref >60.00)
Glucose, Bld: 99 mg/dL (ref 70–99)
Potassium: 4.7 mEq/L (ref 3.5–5.1)
Sodium: 135 mEq/L (ref 135–145)

## 2015-09-18 LAB — URINALYSIS
BILIRUBIN URINE: NEGATIVE
HGB URINE DIPSTICK: NEGATIVE
Ketones, ur: NEGATIVE
LEUKOCYTES UA: NEGATIVE
NITRITE: NEGATIVE
Specific Gravity, Urine: 1.01 (ref 1.000–1.030)
TOTAL PROTEIN, URINE-UPE24: NEGATIVE
Urine Glucose: NEGATIVE
Urobilinogen, UA: 0.2 (ref 0.0–1.0)
pH: 6.5 (ref 5.0–8.0)

## 2015-09-18 LAB — LIPID PANEL
CHOL/HDL RATIO: 7
Cholesterol: 238 mg/dL — ABNORMAL HIGH (ref 0–200)
HDL: 34.7 mg/dL — ABNORMAL LOW (ref >39.00)
NONHDL: 203.26
TRIGLYCERIDES: 274 mg/dL — AB (ref 0.0–149.0)
VLDL: 54.8 mg/dL — ABNORMAL HIGH (ref 0.0–40.0)

## 2015-09-18 LAB — HEPATIC FUNCTION PANEL
ALK PHOS: 56 U/L (ref 39–117)
ALT: 62 U/L — AB (ref 0–53)
AST: 25 U/L (ref 0–37)
Albumin: 4.5 g/dL (ref 3.5–5.2)
BILIRUBIN DIRECT: 0.1 mg/dL (ref 0.0–0.3)
BILIRUBIN TOTAL: 0.6 mg/dL (ref 0.2–1.2)
Total Protein: 7.5 g/dL (ref 6.0–8.3)

## 2015-09-18 LAB — PSA: PSA: 0.47 ng/mL (ref 0.10–4.00)

## 2015-09-18 LAB — TSH: TSH: 1.44 u[IU]/mL (ref 0.35–4.50)

## 2015-09-18 LAB — LDL CHOLESTEROL, DIRECT: Direct LDL: 165 mg/dL

## 2015-09-18 MED ORDER — DICLOFENAC SODIUM 75 MG PO TBEC: 75.0000 mg | DELAYED_RELEASE_TABLET | Freq: Two times a day (BID) | ORAL | Status: AC

## 2015-09-18 MED ORDER — VITAMIN D 1000 UNITS PO TABS: 1000.0000 [IU] | ORAL_TABLET | Freq: Every day | ORAL | Status: AC

## 2015-09-18 NOTE — Progress Notes (Signed)
Subjective:  Patient ID: Jesse Stewart, male    DOB: [birth year]  Age: 50 y.o. MRN: [id number]  CC: Annual Exam   HPI Jesse Stewart presents for a well exam  Outpatient Prescriptions Prior to Visit  Medication Sig Dispense Refill  . lisinopril (PRINIVIL,ZESTRIL) 40 MG tablet TAKE 1 TABLET BY MOUTH EVERY DAY 90 tablet 3  . meloxicam (MOBIC) 15 MG tablet Take 1 tablet (15 mg total) by mouth daily as needed for pain. 90 tablet 2  . HYDROcodone-homatropine (HYCODAN) 5-1.5 MG/5ML syrup Take 5 mLs by mouth every 8 (eight) hours as needed for cough. 120 mL 0   No facility-administered medications prior to visit.    ROS Review of Systems  Constitutional: Negative for appetite change, fatigue and unexpected weight change.  HENT: Negative for congestion, nosebleeds, sneezing, sore throat and trouble swallowing.   Eyes: Negative for itching and visual disturbance.  Respiratory: Negative for cough.   Cardiovascular: Negative for chest pain, palpitations and leg swelling.  Gastrointestinal: Negative for nausea, diarrhea, blood in stool and abdominal distention.  Genitourinary: Negative for frequency and hematuria.  Musculoskeletal: Negative for back pain, joint swelling, gait problem and neck pain.  Skin: Negative for rash.  Neurological: Negative for dizziness, tremors, speech difficulty and weakness.  Psychiatric/Behavioral: Negative for suicidal ideas, sleep disturbance, dysphoric mood and agitation. The patient is not nervous/anxious.     Objective:  BP 118/80 mmHg  Pulse 69  Ht 5\' 9"  (1.753 m)  Wt 240 lb (108.863 kg)  BMI 35.43 kg/m2  SpO2 95%  BP Readings from Last 3 Encounters:  09/18/15 118/80  10/06/14 118/82  09/16/14 124/94    Wt Readings from Last 3 Encounters:  09/18/15 240 lb (108.863 kg)  10/06/14 223 lb (101.152 kg)  09/16/14 224 lb (101.606 kg)    Physical Exam  Constitutional: He is oriented to person, place, and time. He appears  well-developed and well-nourished. No distress.  HENT:  Head: Normocephalic and atraumatic.  Right Ear: External ear normal.  Left Ear: External ear normal.  Nose: Nose normal.  Mouth/Throat: Oropharynx is clear and moist. No oropharyngeal exudate.  Eyes: Conjunctivae and EOM are normal. Pupils are equal, round, and reactive to light. Right eye exhibits no discharge. Left eye exhibits no discharge. No scleral icterus.  Neck: Normal range of motion. Neck supple. No JVD present. No tracheal deviation present. No thyromegaly present.  Cardiovascular: Normal rate, regular rhythm, normal heart sounds and intact distal pulses.  Exam reveals no gallop and no friction rub.   No murmur heard. Pulmonary/Chest: Effort normal and breath sounds normal. No stridor. No respiratory distress. He has no wheezes. He has no rales. He exhibits no tenderness.  Abdominal: Soft. Bowel sounds are normal. He exhibits no distension and no mass. There is no tenderness. There is no rebound and no guarding.  Genitourinary: Rectum normal, prostate normal and penis normal. Guaiac negative stool. No penile tenderness.  Musculoskeletal: Normal range of motion. He exhibits no edema or tenderness.  Lymphadenopathy:    He has no cervical adenopathy.  Neurological: He is alert and oriented to person, place, and time. He has normal reflexes. No cranial nerve deficit. He exhibits normal muscle tone. Coordination normal.  Skin: Skin is warm and dry. No rash noted. He is not diaphoretic. No erythema. No pallor.  Psychiatric: He has a normal mood and affect. His behavior is normal. Judgment and thought content normal.  Rectal - per [facility] on back - no change  Lab Results  Component Value Date   WBC 10.4 10/06/2014   HGB 15.1 10/06/2014   HCT 43.8 10/06/2014   PLT 293.0 10/06/2014   GLUCOSE 97 09/16/2014   CHOL 242* 09/16/2014   TRIG 255.0* 09/16/2014   HDL 39.90 09/16/2014   LDLDIRECT 153.0 09/16/2014   LDLCALC 135*  09/14/2013   ALT 36 09/16/2014   AST 20 09/16/2014   NA 136 09/16/2014   K 4.2 09/16/2014   CL 100 09/16/2014   CREATININE 1.04 09/16/2014   BUN 16 09/16/2014   CO2 26 09/16/2014   TSH 1.12 09/16/2014   PSA 0.58 09/16/2014   HGBA1C 5.9 09/14/2013    No results found.  Assessment & Plan:   There are no diagnoses linked to this encounter. I have discontinued Mr. Bolin's HYDROcodone-homatropine. I am also having him maintain his lisinopril and meloxicam.  No orders of the defined types were placed in this encounter.     Follow-up: No Follow-up on file.  Walker Kehr, MD

## 2015-09-18 NOTE — Progress Notes (Signed)
Pre visit review using our clinic review tool, if applicable. No additional management support is needed unless otherwise documented below in the visit note. 

## 2015-09-18 NOTE — Assessment & Plan Note (Addendum)
We discussed age appropriate health related issues, including available/recomended screening tests and vaccinations. We discussed a need for adhering to healthy diet and exercise. Labs/EKG were reviewed/ordered. All questions were answered.  Colon at the [facility] this year Loose wt

## 2015-09-18 NOTE — Assessment & Plan Note (Signed)
NAS Lisinopril

## 2015-09-18 NOTE — Assessment & Plan Note (Signed)
Labs

## 2016-09-18 ENCOUNTER — Encounter: Payer: Self-pay | Admitting: Internal Medicine

## 2016-09-18 ENCOUNTER — Ambulatory Visit (INDEPENDENT_AMBULATORY_CARE_PROVIDER_SITE_OTHER): Payer: BC Managed Care – PPO | Admitting: Internal Medicine

## 2016-09-18 ENCOUNTER — Other Ambulatory Visit (INDEPENDENT_AMBULATORY_CARE_PROVIDER_SITE_OTHER): Payer: BC Managed Care – PPO

## 2016-09-18 VITALS — BP 118/80 | HR 72 | Temp 97.9°F | Ht 69.0 in | Wt 222.1 lb

## 2016-09-18 DIAGNOSIS — Z0000 Encounter for general adult medical examination without abnormal findings: Secondary | ICD-10-CM

## 2016-09-18 DIAGNOSIS — D485 Neoplasm of uncertain behavior of skin: Secondary | ICD-10-CM | POA: Diagnosis not present

## 2016-09-18 LAB — CBC WITH DIFFERENTIAL/PLATELET
BASOS ABS: 0 10*3/uL (ref 0.0–0.1)
Basophils Relative: 0.5 % (ref 0.0–3.0)
Eosinophils Absolute: 0.1 10*3/uL (ref 0.0–0.7)
Eosinophils Relative: 1.5 % (ref 0.0–5.0)
HEMATOCRIT: 44.5 % (ref 39.0–52.0)
Hemoglobin: 15.2 g/dL (ref 13.0–17.0)
Lymphocytes Relative: 26 % (ref 12.0–46.0)
Lymphs Abs: 1.9 10*3/uL (ref 0.7–4.0)
MCHC: 34.2 g/dL (ref 30.0–36.0)
MCV: 87.6 fl (ref 78.0–100.0)
MONOS PCT: 8 % (ref 3.0–12.0)
Monocytes Absolute: 0.6 10*3/uL (ref 0.1–1.0)
NEUTROS ABS: 4.8 10*3/uL (ref 1.4–7.7)
Neutrophils Relative %: 64 % (ref 43.0–77.0)
Platelets: 253 10*3/uL (ref 150.0–400.0)
RBC: 5.08 Mil/uL (ref 4.22–5.81)
RDW: 13 % (ref 11.5–15.5)
WBC: 7.4 10*3/uL (ref 4.0–10.5)

## 2016-09-18 LAB — LIPID PANEL
Cholesterol: 153 mg/dL (ref 0–200)
HDL: 40.1 mg/dL (ref >39.00)
LDL Cholesterol: 82 mg/dL (ref 0–99)
NonHDL: 112.96
Total CHOL/HDL Ratio: 4
Triglycerides: 155 mg/dL — ABNORMAL HIGH (ref 0.0–149.0)
VLDL: 31 mg/dL (ref 0.0–40.0)

## 2016-09-18 LAB — URINALYSIS
BILIRUBIN URINE: NEGATIVE
HGB URINE DIPSTICK: NEGATIVE
KETONES UR: NEGATIVE
LEUKOCYTES UA: NEGATIVE
Nitrite: NEGATIVE
Specific Gravity, Urine: <=1.005 — AB (ref 1.000–1.030)
Total Protein, Urine: NEGATIVE
Urine Glucose: NEGATIVE
Urobilinogen, UA: 0.2 (ref 0.0–1.0)
pH: 6.5 (ref 5.0–8.0)

## 2016-09-18 LAB — BASIC METABOLIC PANEL
BUN: 15 mg/dL (ref 6–23)
CALCIUM: 9.7 mg/dL (ref 8.4–10.5)
CO2: 26 meq/L (ref 19–32)
Chloride: 104 mEq/L (ref 96–112)
Creatinine, Ser: 1.03 mg/dL (ref 0.40–1.50)
GFR: 80.66 mL/min (ref >60.00)
Glucose, Bld: 101 mg/dL — ABNORMAL HIGH (ref 70–99)
POTASSIUM: 4.5 meq/L (ref 3.5–5.1)
SODIUM: 137 meq/L (ref 135–145)

## 2016-09-18 LAB — PSA: PSA: 0.58 ng/mL (ref 0.10–4.00)

## 2016-09-18 LAB — HEPATIC FUNCTION PANEL
ALBUMIN: 4.8 g/dL (ref 3.5–5.2)
ALK PHOS: 74 U/L (ref 39–117)
ALT: 23 U/L (ref 0–53)
AST: 17 U/L (ref 0–37)
Bilirubin, Direct: 0.2 mg/dL (ref 0.0–0.3)
TOTAL PROTEIN: 7.7 g/dL (ref 6.0–8.3)
Total Bilirubin: 1 mg/dL (ref 0.2–1.2)

## 2016-09-18 LAB — TSH: TSH: 1.06 u[IU]/mL (ref 0.35–4.50)

## 2016-09-18 MED ORDER — VITAMIN D3 50 MCG (2000 UT) PO CAPS: 2000.0000 [IU] | ORAL_CAPSULE | Freq: Every day | ORAL | 3 refills | Status: AC

## 2016-09-18 NOTE — Progress Notes (Signed)
Subjective:  Patient ID: Jesse Stewart, male    DOB: 1965/06/13  Age: 51 y.o. MRN: [id number]  CC: No chief complaint on file.   HPI Jesse Stewart presents for a well exam. Lost wt on diet  Outpatient Medications Prior to Visit  Medication Sig Dispense Refill  . diclofenac (VOLTAREN) 75 MG EC tablet Take 1 tablet (75 mg total) by mouth 2 (two) times daily. 30 tablet 0  . lisinopril (PRINIVIL,ZESTRIL) 40 MG tablet TAKE 1 TABLET BY MOUTH EVERY DAY 90 tablet 3   No facility-administered medications prior to visit.     ROS Review of Systems  Constitutional: Negative for appetite change, fatigue and unexpected weight change.  HENT: Negative for congestion, nosebleeds, sneezing, sore throat and trouble swallowing.   Eyes: Negative for itching and visual disturbance.  Respiratory: Negative for cough.   Cardiovascular: Negative for chest pain, palpitations and leg swelling.  Gastrointestinal: Negative for abdominal distention, blood in stool, diarrhea and nausea.  Genitourinary: Negative for frequency and hematuria.  Musculoskeletal: Negative for back pain, gait problem, joint swelling and neck pain.  Skin: Negative for rash.  Neurological: Negative for dizziness, tremors, speech difficulty and weakness.  Psychiatric/Behavioral: Negative for agitation, dysphoric mood and sleep disturbance. The patient is not nervous/anxious.     Objective:  BP 118/80 (BP Location: Left Arm, Patient Position: Sitting, Cuff Size: Large)   Pulse 72   Temp 97.9 F (36.6 C) (Oral)   Ht 5\' 9"  (1.753 m)   Wt 222 lb 1.3 oz (100.7 kg)   SpO2 99%   BMI 32.80 kg/m   BP Readings from Last 3 Encounters:  09/18/16 118/80  09/18/15 118/80  10/06/14 118/82    Wt Readings from Last 3 Encounters:  09/18/16 222 lb 1.3 oz (100.7 kg)  09/18/15 240 lb (108.9 kg)  10/06/14 223 lb (101.2 kg)    Physical Exam  Constitutional: He is oriented to person, place, and time. He appears  well-developed and well-nourished. No distress.  NAD  HENT:  Head: Normocephalic and atraumatic.  Right Ear: External ear normal.  Left Ear: External ear normal.  Nose: Nose normal.  Mouth/Throat: Oropharynx is clear and moist. No oropharyngeal exudate.  Eyes: Conjunctivae and EOM are normal. Pupils are equal, round, and reactive to light. Right eye exhibits no discharge. Left eye exhibits no discharge. No scleral icterus.  Neck: Normal range of motion. Neck supple. No JVD present. No tracheal deviation present. No thyromegaly present.  Cardiovascular: Normal rate, regular rhythm, normal heart sounds and intact distal pulses.  Exam reveals no gallop and no friction rub.   No murmur heard. Pulmonary/Chest: Effort normal and breath sounds normal. No stridor. No respiratory distress. He has no wheezes. He has no rales. He exhibits no tenderness.  Abdominal: Soft. Bowel sounds are normal. He exhibits no distension and no mass. There is no tenderness. There is no rebound and no guarding.  Musculoskeletal: Normal range of motion. He exhibits no edema or tenderness.  Lymphadenopathy:    He has no cervical adenopathy.  Neurological: He is alert and oriented to person, place, and time. He has normal reflexes. No cranial nerve deficit. He exhibits normal muscle tone. He displays a negative Romberg sign. Coordination and gait normal.  Skin: Skin is warm and dry. No rash noted. He is not diaphoretic. No erythema. No pallor.  Psychiatric: He has a normal mood and affect. His behavior is normal. Judgment and thought content normal.  moles - mid abd and R  side of abd Rectal - per recent colonoscopy  Lab Results  Component Value Date   WBC 7.6 09/18/2015   HGB 15.0 09/18/2015   HCT 43.1 09/18/2015   PLT 284.0 09/18/2015   GLUCOSE 99 09/18/2015   CHOL 238 (H) 09/18/2015   TRIG 274.0 (H) 09/18/2015   HDL 34.70 (L) 09/18/2015   LDLDIRECT 165.0 09/18/2015   LDLCALC 135 (H) 09/14/2013   ALT 62 (H)  09/18/2015   AST 25 09/18/2015   NA 135 09/18/2015   K 4.7 09/18/2015   CL 100 09/18/2015   CREATININE 1.10 09/18/2015   BUN 13 09/18/2015   CO2 28 09/18/2015   TSH 1.44 09/18/2015   PSA 0.47 09/18/2015   HGBA1C 5.9 09/14/2013    No results found.  Assessment & Plan:   There are no diagnoses linked to this encounter. I am having Mr. Constante maintain his lisinopril and diclofenac.  No orders of the defined types were placed in this encounter.    Follow-up: No Follow-up on file.  Walker Kehr, MD

## 2016-09-18 NOTE — Assessment & Plan Note (Signed)
We discussed age appropriate health related issues, including available/recomended screening tests and vaccinations. We discussed a need for adhering to healthy diet and exercise. Labs ordered. All questions were answered.  Colon at the [facility] 2017

## 2016-09-18 NOTE — Assessment & Plan Note (Signed)
moles - mid abd and R side of abd Skin bx

## 2016-09-18 NOTE — Patient Instructions (Addendum)
Skin bx w/me  Shingrix vaccine

## 2016-10-01 ENCOUNTER — Encounter: Payer: Self-pay | Admitting: Internal Medicine

## 2016-10-01 ENCOUNTER — Other Ambulatory Visit: Payer: BC Managed Care – PPO

## 2016-10-01 ENCOUNTER — Ambulatory Visit (INDEPENDENT_AMBULATORY_CARE_PROVIDER_SITE_OTHER): Payer: BC Managed Care – PPO | Admitting: Internal Medicine

## 2016-10-01 DIAGNOSIS — D485 Neoplasm of uncertain behavior of skin: Secondary | ICD-10-CM

## 2016-10-01 NOTE — Addendum Note (Signed)
Addended by: Karren Cobble on: 10/01/2016 11:36 AM   Modules accepted: Orders

## 2016-10-01 NOTE — Progress Notes (Signed)
Subjective:  Patient ID: Jesse Stewart, male    DOB: January 22, 1966  Age: 51 y.o. MRN: [id number]  CC: No chief complaint on file.   HPI Jesse Stewart presents for skin bx  Outpatient Medications Prior to Visit  Medication Sig Dispense Refill  . Cholecalciferol (VITAMIN D3) 2000 units capsule Take 1 capsule (2,000 Units total) by mouth daily. 100 capsule 3  . diclofenac (VOLTAREN) 75 MG EC tablet Take 1 tablet (75 mg total) by mouth 2 (two) times daily. 30 tablet 0  . lisinopril (PRINIVIL,ZESTRIL) 40 MG tablet TAKE 1 TABLET BY MOUTH EVERY DAY 90 tablet 3   No facility-administered medications prior to visit.     ROS Review of Systems  Objective:  BP 124/82 (BP Location: Left Arm, Patient Position: Sitting, Cuff Size: Normal)   Pulse 64   Temp 98.3 F (36.8 C) (Oral)   Ht 5\' 9"  (1.753 m)   Wt 224 lb (101.6 kg)   SpO2 99%   BMI 33.08 kg/m   BP Readings from Last 3 Encounters:  10/01/16 124/82  09/18/16 118/80  09/18/15 118/80    Wt Readings from Last 3 Encounters:  10/01/16 224 lb (101.6 kg)  09/18/16 222 lb 1.3 oz (100.7 kg)  09/18/15 240 lb (108.9 kg)    Physical Exam    Procedure Note :     Procedure :  Skin biopsy   Indication:  Changing mole (s ),  Suspicious lesion(s)   Risks including unsuccessful procedure , bleeding, infection, bruising, scar, a need for another complete procedure and others were explained to the patient in detail as well as the benefits. Informed consent was obtained.  The patient was placed in a decubitus position.  Lesion #1 on  Epigastric abdomen more on the right measuring 2x3    mm   Skin over lesion #1  was prepped with Betadine and alcohol  and anesthetized with 1 cc of 2% lidocaine and epinephrine, using a 25-gauge 1 inch needle.  Shave biopsy with a sterile Dermablade was carried out in the usual fashion. Hyfrecator was used to destroy the rest of the lesion potentially left behind and for hemostasis.  Band-Aid was applied with antibiotic ointment.    Lesion #2 on  The R lower rib cage measuring 4x3  mm   Skin over lesion #2  was prepped with Betadine and alcohol  and anesthetized with 1 cc of 2% lidocaine and epinephrine, using a 25-gauge 1 inch needle.  Shave biopsy with a sterile Dermablade was carried out in the usual fashion. Hyfrecator was used to destroy the rest of the lesion potentially left behind and for hemostasis. Band-Aid was applied with antibiotic ointment.    Tolerated well. Complications none.     Lab Results  Component Value Date   WBC 7.4 09/18/2016   HGB 15.2 09/18/2016   HCT 44.5 09/18/2016   PLT 253.0 09/18/2016   GLUCOSE 101 (H) 09/18/2016   CHOL 153 09/18/2016   TRIG 155.0 (H) 09/18/2016   HDL 40.10 09/18/2016   LDLDIRECT 165.0 09/18/2015   LDLCALC 82 09/18/2016   ALT 23 09/18/2016   AST 17 09/18/2016   NA 137 09/18/2016   K 4.5 09/18/2016   CL 104 09/18/2016   CREATININE 1.03 09/18/2016   BUN 15 09/18/2016   CO2 26 09/18/2016   TSH 1.06 09/18/2016   PSA 0.58 09/18/2016   HGBA1C 5.9 09/14/2013    No results found.  Assessment & Plan:   There are no  diagnoses linked to this encounter. I am having [name] maintain his lisinopril, diclofenac, and Vitamin D3.  No orders of the defined types were placed in this encounter.    Follow-up: No Follow-up on file.  Walker Kehr, MD

## 2016-10-01 NOTE — Patient Instructions (Signed)
Postprocedure instructions :    A Band-Aid should be  changed twice daily. You can take a shower tomorrow.  Keep the wounds clean. You can wash them with liquid soap and water. Pat dry with gauze or a Kleenex tissue  Before applying antibiotic ointment and a Band-Aid.   You need to report immediately  if fever, chills or any signs of infection develop.    The biopsy results should be available in 1 -2 weeks. 

## 2016-10-01 NOTE — Assessment & Plan Note (Signed)
Skin bx 

## 2016-10-02 ENCOUNTER — Telehealth: Payer: Self-pay | Admitting: Internal Medicine

## 2016-10-02 NOTE — Telephone Encounter (Signed)
Jesse Stewart, Please inform the patient that his mole on the upper abdomen had a moderate potential to become malignant. The one on the R side was ok.  Thanks, AP

## 2016-10-03 NOTE — Telephone Encounter (Signed)
Pt.notified

## 2017-09-19 ENCOUNTER — Encounter: Payer: BC Managed Care – PPO | Admitting: Internal Medicine

## 2017-09-19 DIAGNOSIS — Z0289 Encounter for other administrative examinations: Secondary | ICD-10-CM

## 2019-05-13 ENCOUNTER — Ambulatory Visit (INDEPENDENT_AMBULATORY_CARE_PROVIDER_SITE_OTHER): Payer: BC Managed Care – PPO | Admitting: Internal Medicine

## 2019-05-13 ENCOUNTER — Encounter: Payer: Self-pay | Admitting: Internal Medicine

## 2019-05-13 ENCOUNTER — Other Ambulatory Visit: Payer: Self-pay

## 2019-05-13 VITALS — BP 116/78 | HR 74 | Temp 98.4°F | Ht 69.0 in | Wt 230.0 lb

## 2019-05-13 DIAGNOSIS — I10 Essential (primary) hypertension: Secondary | ICD-10-CM

## 2019-05-13 DIAGNOSIS — Z0000 Encounter for general adult medical examination without abnormal findings: Secondary | ICD-10-CM

## 2019-05-13 DIAGNOSIS — D126 Benign neoplasm of colon, unspecified: Secondary | ICD-10-CM

## 2019-05-13 DIAGNOSIS — E669 Obesity, unspecified: Secondary | ICD-10-CM | POA: Diagnosis not present

## 2019-05-13 DIAGNOSIS — E785 Hyperlipidemia, unspecified: Secondary | ICD-10-CM

## 2019-05-13 NOTE — Assessment & Plan Note (Signed)
NAS Lisinopril

## 2019-05-13 NOTE — Patient Instructions (Signed)
Cardiac CT calcium scoring test $150 Tel # is [phone]   Computed tomography, more commonly known as a CT or CAT scan, is a diagnostic medical imaging test. Like traditional x-rays, it produces multiple images or pictures of the inside of the body. The cross-sectional images generated during a CT scan can be reformatted in multiple planes. They can even generate three-dimensional images. These images can be viewed on a computer monitor, printed on film or by a 3D printer, or transferred to a CD or DVD. CT images of internal organs, bones, soft tissue and blood vessels provide greater detail than traditional x-rays, particularly of soft tissues and blood vessels. A cardiac CT scan for coronary calcium is a non-invasive way of obtaining information about the presence, location and extent of calcified plaque in the coronary arteries--the vessels that supply oxygen-containing blood to the heart muscle. Calcified plaque results when there is a build-up of fat and other substances under the inner layer of the artery. This material can calcify which signals the presence of atherosclerosis, a disease of the vessel wall, also called coronary artery disease (CAD). People with this disease have an increased risk for heart attacks. In addition, over time, progression of plaque build up (CAD) can narrow the arteries or even close off blood flow to the heart. The result may be chest pain, sometimes called "angina," or a heart attack. Because calcium is a marker of CAD, the amount of calcium detected on a cardiac CT scan is a helpful prognostic tool. The findings on cardiac CT are expressed as a calcium score. Another name for this test is coronary artery calcium scoring.  What are some common uses of the procedure? The goal of cardiac CT scan for calcium scoring is to determine if CAD is present and to what extent, even if there are no symptoms. It is a screening study that may be recommended by a physician for  patients with risk factors for CAD but no clinical symptoms. The major risk factors for CAD are: . high blood cholesterol levels  . family history of heart attacks  . diabetes  . high blood pressure  . cigarette smoking  . overweight or obese  . physical inactivity   A negative cardiac CT scan for calcium scoring shows no calcification within the coronary arteries. This suggests that CAD is absent or so minimal it cannot be seen by this technique. The chance of having a heart attack over the next two to five years is very low under these circumstances. A positive test means that CAD is present, regardless of whether or not the patient is experiencing any symptoms. The amount of calcification--expressed as the calcium score--may help to predict the likelihood of a myocardial infarction (heart attack) in the coming years and helps your medical doctor or cardiologist decide whether the patient may need to take preventive medicine or undertake other measures such as diet and exercise to lower the risk for heart attack. The extent of CAD is graded according to your calcium score:  Calcium Score  Presence of CAD (coronary artery disease)  0 No evidence of CAD   1-10 Minimal evidence of CAD  11-100 Mild evidence of CAD  101-400 Moderate evidence of CAD  Over 400 Extensive evidence of CAD     These suggestions will probably help you to improve your metabolism if you are not overweight and to lose weight if you are overweight: 1.  Reduce your consumption of sugars and starches.  Eliminate high fructose  corn syrup from your diet.  Reduce your consumption of processed foods.  For desserts try to have seasonal fruits, berries, nuts, cheeses or dark chocolate with more than 70% cacao. 2.  Do not snack 3.  You do not have to eat breakfast.  If you choose to have breakfast-eat plain greek yogurt, eggs, oatmeal (without sugar) 4.  Drink water, freshly brewed unsweetened tea (green, black or herbal) or  coffee.  Do not drink sodas including diet sodas , juices, beverages sweetened with artificial sweeteners. 5.  Reduce your consumption of refined grains. 6.  Avoid protein drinks such as Optifast, Slim fast etc. Eat chicken, fish, meat, dairy and beans for your sources of protein 7.  Natural unprocessed fats like cold pressed virgin olive oil, butter, coconut oil are good for you.  Eat avocados 8.  Increase your consumption of fiber.  Fruits, berries, vegetables, whole grains, flaxseeds, Chia seeds, beans, popcorn, nuts, oatmeal are good sources of fiber 9.  Use vinegar in your diet, i.e. apple cider vinegar, red wine or balsamic vinegar 10.  You can try fasting.  For example you can skip breakfast and lunch every other day (24-hour fast) 11.  Stress reduction, good night sleep, relaxation, meditation, yoga and other physical activity is likely to help you to maintain low weight too. 12.  If you drink alcohol, limit your alcohol intake to no more than 2 drinks a day.   Mediterranean diet is good for you. ([employer] has a typical Mediterranean cuisine menu) The Mediterranean diet is a way of eating based on the traditional cuisine of countries bordering the [employer]. While there is no single definition of the Mediterranean diet, it is typically high in vegetables, fruits, whole grains, beans, nut and seeds, and olive oil. The main components of Mediterranean diet include: Marland Kitchen Daily consumption of vegetables, fruits, whole grains and healthy fats  . Weekly intake of fish, poultry, beans and eggs  . Moderate portions of dairy products  . Limited intake of red meat Other important elements of the Mediterranean diet are sharing meals with family and friends, enjoying a glass of red wine and being physically active. Health benefits of a Mediterranean diet: A traditional Mediterranean diet consisting of large quantities of fresh fruits and vegetables, nuts, fish and olive oil--coupled with  physical activity--can reduce your risk of serious mental and physical health problems by: Preventing heart disease and strokes. Following a Mediterranean diet limits your intake of refined breads, processed foods, and red meat, and encourages drinking red wine instead of hard liquor--all factors that can help prevent heart disease and stroke. Keeping you agile. If you're an older adult, the nutrients gained with a Mediterranean diet may reduce your risk of developing muscle weakness and other signs of frailty by about 70 percent. Reducing the risk of Alzheimer's. Research suggests that the [place] diet may improve cholesterol, blood sugar levels, and overall blood vessel health, which in turn may reduce your risk of Alzheimer's disease or dementia. Halving the risk of Parkinson's disease. The high levels of antioxidants in the Mediterranean diet can prevent cells from undergoing a damaging process called oxidative stress, thereby cutting the risk of Parkinson's disease in half. Increasing longevity. By reducing your risk of developing heart disease or cancer with the Mediterranean diet, you're reducing your risk of death at any age by 20%. Protecting against type 2 diabetes. A Mediterranean diet is rich in fiber which digests slowly, prevents huge swings in blood sugar, and can help  you maintain a healthy weight.

## 2019-05-13 NOTE — Assessment & Plan Note (Addendum)
BMI 33 Weight loss discussed

## 2019-05-13 NOTE — Progress Notes (Signed)
Subjective:  Patient ID: Jesse Stewart, male    DOB: June 09, 1965  Age: 53 y.o. MRN: [id number]  CC: No chief complaint on file.   HPI Jesse Stewart presents for a well exam.  He goes to [place] regular.  He had a colonoscopy.  He is due for another one in a few months  Outpatient Medications Prior to Visit  Medication Sig Dispense Refill  . Cholecalciferol (VITAMIN D3) 2000 units capsule Take 1 capsule (2,000 Units total) by mouth daily. 100 capsule 3  . diclofenac (VOLTAREN) 75 MG EC tablet Take 1 tablet (75 mg total) by mouth 2 (two) times daily. 30 tablet 0  . lisinopril (PRINIVIL,ZESTRIL) 40 MG tablet TAKE 1 TABLET BY MOUTH EVERY DAY 90 tablet 3   No facility-administered medications prior to visit.    ROS: Review of Systems  Constitutional: Positive for unexpected weight change. Negative for appetite change and fatigue.  HENT: Negative for congestion, nosebleeds, sneezing, sore throat and trouble swallowing.   Eyes: Negative for itching and visual disturbance.  Respiratory: Negative for cough.   Cardiovascular: Negative for chest pain, palpitations and leg swelling.  Gastrointestinal: Negative for abdominal distention, blood in stool, diarrhea and nausea.  Genitourinary: Negative for frequency and hematuria.  Musculoskeletal: Negative for back pain, gait problem, joint swelling and neck pain.  Skin: Negative for rash.  Neurological: Negative for dizziness, tremors, speech difficulty and weakness.  Psychiatric/Behavioral: Negative for agitation, dysphoric mood, sleep disturbance and suicidal ideas. The patient is not nervous/anxious.     Objective:  BP 116/78 (BP Location: Left Arm, Patient Position: Sitting, Cuff Size: Large)   Pulse 74   Temp 98.4 F (36.9 C) (Oral)   Ht 5\' 9"  (1.753 m)   Wt 230 lb (104.3 kg)   SpO2 97%   BMI 33.97 kg/m   BP Readings from Last 3 Encounters:  05/13/19 116/78  10/01/16 124/82  09/18/16 118/80    Wt Readings from  Last 3 Encounters:  05/13/19 230 lb (104.3 kg)  10/01/16 224 lb (101.6 kg)  09/18/16 222 lb 1.3 oz (100.7 kg)    Physical Exam Constitutional:      General: He is not in acute distress.    Appearance: He is well-developed. He is obese.     Comments: NAD  Eyes:     Conjunctiva/sclera: Conjunctivae normal.     Pupils: Pupils are equal, round, and reactive to light.  Neck:     Thyroid: No thyromegaly.     Vascular: No JVD.  Cardiovascular:     Rate and Rhythm: Normal rate and regular rhythm.     Heart sounds: Normal heart sounds. No murmur. No friction rub. No gallop.   Pulmonary:     Effort: Pulmonary effort is normal. No respiratory distress.     Breath sounds: Normal breath sounds. No wheezing or rales.  Chest:     Chest wall: No tenderness.  Abdominal:     General: Bowel sounds are normal. There is no distension.     Palpations: Abdomen is soft. There is no mass.     Tenderness: There is no abdominal tenderness. There is no guarding or rebound.  Musculoskeletal:        General: No tenderness. Normal range of motion.     Cervical back: Normal range of motion.  Lymphadenopathy:     Cervical: No cervical adenopathy.  Skin:    General: Skin is warm and dry.     Findings: No rash.  Neurological:  Mental Status: He is alert and oriented to person, place, and time.     Cranial Nerves: No cranial nerve deficit.     Motor: No abnormal muscle tone.     Coordination: Coordination normal.     Gait: Gait normal.     Deep Tendon Reflexes: Reflexes are normal and symmetric.  Psychiatric:        Behavior: Behavior normal.        Thought Content: Thought content normal.        Judgment: Judgment normal.   Rectal-deferred to [facility] doctors    Lab Results  Component Value Date   WBC 7.4 09/18/2016   HGB 15.2 09/18/2016   HCT 44.5 09/18/2016   PLT 253.0 09/18/2016   GLUCOSE 101 (H) 09/18/2016   CHOL 153 09/18/2016   TRIG 155.0 (H) 09/18/2016   HDL 40.10 09/18/2016    LDLDIRECT 165.0 09/18/2015   LDLCALC 82 09/18/2016   ALT 23 09/18/2016   AST 17 09/18/2016   NA 137 09/18/2016   K 4.5 09/18/2016   CL 104 09/18/2016   CREATININE 1.03 09/18/2016   BUN 15 09/18/2016   CO2 26 09/18/2016   TSH 1.06 09/18/2016   PSA 0.58 09/18/2016   HGBA1C 5.9 09/14/2013    No results found.  Assessment & Plan:     Follow-up: No follow-ups on file.  Walker Kehr, MD

## 2019-05-16 ENCOUNTER — Encounter: Payer: Self-pay | Admitting: Internal Medicine

## 2019-05-16 DIAGNOSIS — K635 Polyp of colon: Secondary | ICD-10-CM | POA: Insufficient documentation

## 2019-05-16 NOTE — Assessment & Plan Note (Signed)
Per patient discovered at the [place] during colonoscopy in 2017.  Due colonoscopy in 2021

## 2019-05-16 NOTE — Assessment & Plan Note (Signed)
Coronary calcium CT scoring test was offered  Labs

## 2019-05-16 NOTE — Assessment & Plan Note (Addendum)
We discussed age appropriate health related issues, including available/recomended screening tests and vaccinations. We discussed a need for adhering to healthy diet and exercise. Labs were ordered to be later reviewed . All questions were answered. Coronary calcium CT scoring test was offered  Colon at the [facility] 2017, due in 2021

## 2019-05-17 ENCOUNTER — Other Ambulatory Visit (INDEPENDENT_AMBULATORY_CARE_PROVIDER_SITE_OTHER): Payer: BC Managed Care – PPO

## 2019-05-17 ENCOUNTER — Other Ambulatory Visit: Payer: Self-pay

## 2019-05-17 DIAGNOSIS — Z0000 Encounter for general adult medical examination without abnormal findings: Secondary | ICD-10-CM

## 2019-05-17 DIAGNOSIS — Z125 Encounter for screening for malignant neoplasm of prostate: Secondary | ICD-10-CM

## 2019-05-17 DIAGNOSIS — E785 Hyperlipidemia, unspecified: Secondary | ICD-10-CM | POA: Diagnosis not present

## 2019-05-17 LAB — LIPID PANEL
Cholesterol: 160 mg/dL (ref 0–200)
HDL: 36.1 mg/dL — ABNORMAL LOW (ref >39.00)
LDL Cholesterol: 93 mg/dL (ref 0–99)
NonHDL: 124.2
Total CHOL/HDL Ratio: 4
Triglycerides: 155 mg/dL — ABNORMAL HIGH (ref 0.0–149.0)
VLDL: 31 mg/dL (ref 0.0–40.0)

## 2019-05-17 LAB — BASIC METABOLIC PANEL
BUN: 16 mg/dL (ref 6–23)
CO2: 25 mEq/L (ref 19–32)
Calcium: 9.5 mg/dL (ref 8.4–10.5)
Chloride: 101 mEq/L (ref 96–112)
Creatinine, Ser: 1.04 mg/dL (ref 0.40–1.50)
GFR: 74.29 mL/min (ref >60.00)
Glucose, Bld: 106 mg/dL — ABNORMAL HIGH (ref 70–99)
Potassium: 4.3 mEq/L (ref 3.5–5.1)
Sodium: 135 mEq/L (ref 135–145)

## 2019-05-17 LAB — URINALYSIS
Bilirubin Urine: NEGATIVE
Hgb urine dipstick: NEGATIVE
Ketones, ur: NEGATIVE
Leukocytes,Ua: NEGATIVE
Nitrite: NEGATIVE
Specific Gravity, Urine: 1.025 (ref 1.000–1.030)
Total Protein, Urine: NEGATIVE
Urine Glucose: NEGATIVE
Urobilinogen, UA: 0.2 (ref 0.0–1.0)
pH: 5.5 (ref 5.0–8.0)

## 2019-05-17 LAB — PSA: PSA: 0.54 ng/mL (ref 0.10–4.00)

## 2019-05-17 LAB — HEPATIC FUNCTION PANEL
ALT: 56 U/L — ABNORMAL HIGH (ref 0–53)
AST: 29 U/L (ref 0–37)
Albumin: 4.5 g/dL (ref 3.5–5.2)
Alkaline Phosphatase: 59 U/L (ref 39–117)
Bilirubin, Direct: 0.2 mg/dL (ref 0.0–0.3)
Total Bilirubin: 0.9 mg/dL (ref 0.2–1.2)
Total Protein: 7.1 g/dL (ref 6.0–8.3)

## 2019-05-17 LAB — CBC WITH DIFFERENTIAL/PLATELET
Basophils Absolute: 0 10*3/uL (ref 0.0–0.1)
Basophils Relative: 0.6 % (ref 0.0–3.0)
Eosinophils Absolute: 0.1 10*3/uL (ref 0.0–0.7)
Eosinophils Relative: 1.9 % (ref 0.0–5.0)
HCT: 41.4 % (ref 39.0–52.0)
Hemoglobin: 14 g/dL (ref 13.0–17.0)
Lymphocytes Relative: 28.5 % (ref 12.0–46.0)
Lymphs Abs: 1.9 10*3/uL (ref 0.7–4.0)
MCHC: 33.7 g/dL (ref 30.0–36.0)
MCV: 88.2 fl (ref 78.0–100.0)
Monocytes Absolute: 0.6 10*3/uL (ref 0.1–1.0)
Monocytes Relative: 9.6 % (ref 3.0–12.0)
Neutro Abs: 4 10*3/uL (ref 1.4–7.7)
Neutrophils Relative %: 59.4 % (ref 43.0–77.0)
Platelets: 246 10*3/uL (ref 150.0–400.0)
RBC: 4.69 Mil/uL (ref 4.22–5.81)
RDW: 13.4 % (ref 11.5–15.5)
WBC: 6.8 10*3/uL (ref 4.0–10.5)

## 2019-05-17 LAB — TSH: TSH: 1.6 u[IU]/mL (ref 0.35–4.50)

## 2019-05-20 ENCOUNTER — Other Ambulatory Visit: Payer: Self-pay

## 2019-05-20 ENCOUNTER — Ambulatory Visit (INDEPENDENT_AMBULATORY_CARE_PROVIDER_SITE_OTHER)
Admission: RE | Admit: 2019-05-20 | Discharge: 2019-05-20 | Disposition: A | Discharge status: Home / Self Care | Payer: Self-pay | Source: Ambulatory Visit | Attending: Internal Medicine | Admitting: Internal Medicine

## 2019-05-20 DIAGNOSIS — E785 Hyperlipidemia, unspecified: Secondary | ICD-10-CM

## 2019-05-20 DIAGNOSIS — I10 Essential (primary) hypertension: Secondary | ICD-10-CM

## 2019-05-21 ENCOUNTER — Telehealth: Payer: Self-pay | Admitting: Internal Medicine

## 2019-05-21 ENCOUNTER — Telehealth: Payer: Self-pay

## 2019-05-21 NOTE — Telephone Encounter (Signed)
Copied from [place] [id number]. Topic: General - Call Back - No Documentation >> May 20, 2019  5:04 PM Erick Blinks wrote: Best contact [phone] Olivia Mackie from [facility] radiology has results for a CT scan, please advise

## 2019-05-21 NOTE — Telephone Encounter (Signed)
Routing to dr plotnikov, please advise, thanks 

## 2019-05-21 NOTE — Telephone Encounter (Signed)
Ok to f/u next wk

## 2019-05-21 NOTE — Telephone Encounter (Signed)
Routing to dr Jenny Reichmann, please advise if ok to wait for dr plotnikov to return on Monday, dr plotnikov out of office today, thanks

## 2019-05-23 ENCOUNTER — Other Ambulatory Visit: Payer: Self-pay | Admitting: Internal Medicine

## 2019-05-23 MED ORDER — ASPIRIN EC 81 MG PO TBEC: 81.0000 mg | DELAYED_RELEASE_TABLET | Freq: Every day | ORAL | 3 refills | Status: AC

## 2019-05-23 MED ORDER — ROSUVASTATIN CALCIUM 5 MG PO TABS: 5.0000 mg | ORAL_TABLET | Freq: Every day | ORAL | 11 refills | Status: AC

## 2019-05-23 NOTE — Telephone Encounter (Signed)
The message attached to CT report has been emailed to the patient.  Thanks

## 2019-05-25 ENCOUNTER — Other Ambulatory Visit: Payer: Self-pay | Admitting: Internal Medicine

## 2019-05-25 DIAGNOSIS — R918 Other nonspecific abnormal finding of lung field: Secondary | ICD-10-CM

## 2019-05-25 NOTE — Telephone Encounter (Signed)
Results in chart

## 2019-06-03 ENCOUNTER — Ambulatory Visit (INDEPENDENT_AMBULATORY_CARE_PROVIDER_SITE_OTHER)
Admission: RE | Admit: 2019-06-03 | Discharge: 2019-06-03 | Disposition: A | Discharge status: Home / Self Care | Payer: BC Managed Care – PPO | Source: Ambulatory Visit | Attending: Internal Medicine | Admitting: Internal Medicine

## 2019-06-03 ENCOUNTER — Other Ambulatory Visit: Payer: Self-pay

## 2019-06-03 DIAGNOSIS — R918 Other nonspecific abnormal finding of lung field: Secondary | ICD-10-CM | POA: Diagnosis not present

## 2020-06-20 ENCOUNTER — Other Ambulatory Visit: Payer: Self-pay

## 2020-06-20 ENCOUNTER — Telehealth (INDEPENDENT_AMBULATORY_CARE_PROVIDER_SITE_OTHER): Payer: BC Managed Care – PPO | Admitting: Family

## 2020-06-20 DIAGNOSIS — R059 Cough, unspecified: Secondary | ICD-10-CM

## 2020-06-20 DIAGNOSIS — U071 COVID-19: Secondary | ICD-10-CM | POA: Diagnosis not present

## 2020-06-20 MED ORDER — DOXYCYCLINE HYCLATE 100 MG PO TABS: 100.0000 mg | ORAL_TABLET | Freq: Two times a day (BID) | ORAL | 0 refills | Status: DC

## 2020-06-20 MED ORDER — PREDNISONE 20 MG PO TABS: 40.0000 mg | ORAL_TABLET | Freq: Every day | ORAL | 0 refills | Status: DC

## 2020-06-20 MED ORDER — BENZONATATE 100 MG PO CAPS: 100.0000 mg | ORAL_CAPSULE | Freq: Three times a day (TID) | ORAL | 0 refills | Status: DC | PRN

## 2020-06-20 NOTE — Progress Notes (Signed)
Jesse Stewart is a 55 y.o. male with the following history as recorded in Epic[employer]:  Patient Active Problem List   Diagnosis Date Noted  . Colon polyps 05/16/2019  . Obesity (BMI 30.0-34.9) 05/13/2019  . Chest tightness 10/06/2014  . Viral URI with cough 10/06/2014  . Hematochezia 12/07/2012  . Hernia of abdominal wall 12/07/2012  . Well adult exam 04/23/2012  . Neoplasm of uncertain behavior of skin 05/04/2010  . ACTINIC KERATOSIS 05/04/2010  . Dyslipidemia 04/04/2008  . GERD 04/04/2008  . Osteoarthritis 04/04/2008  . KNEE PAIN 04/04/2008  . ABNORMAL GLUCOSE NEC 04/04/2008  . ABNORMAL LIVER FUNCTION TESTS 04/04/2008  . Essential hypertension 12/29/2006    Current Outpatient Medications  Medication Sig Dispense Refill  . benzonatate (TESSALON) 100 MG capsule Take 1 capsule (100 mg total) by mouth 3 (three) times daily as needed. 20 capsule 0  . doxycycline (VIBRA-TABS) 100 MG tablet Take 1 tablet (100 mg total) by mouth 2 (two) times daily. 14 tablet 0  . predniSONE (DELTASONE) 20 MG tablet Take 2 tablets (40 mg total) by mouth daily with breakfast. 10 tablet 0  . Cholecalciferol (VITAMIN D3) 2000 units capsule Take 1 capsule (2,000 Units total) by mouth daily. 100 capsule 3  . diclofenac (VOLTAREN) 75 MG EC tablet Take 1 tablet (75 mg total) by mouth 2 (two) times daily. 30 tablet 0  . lisinopril (PRINIVIL,ZESTRIL) 40 MG tablet TAKE 1 TABLET BY MOUTH EVERY DAY 90 tablet 3  . rosuvastatin (CRESTOR) 5 MG tablet Take 1 tablet (5 mg total) by mouth daily. 30 tablet 11   No current facility-administered medications for this visit.    Allergies: Tramadol hcl  Past Medical History:  Diagnosis Date  . Arthritis   . Hypertension     Past Surgical History:  Procedure Laterality Date  . KNEE ARTHROSCOPY W/ MENISCAL REPAIR      Family History  Problem Relation Age of Onset  . Cancer Father        brain    Social History   Tobacco Use  . Smoking status: Never Smoker   . Smokeless tobacco: Never Used  Substance Use Topics  . Alcohol use: Yes    Subjective:   I connected with Jesse Stewart on 06/20/20 at  9:20 AM EST by a video enabled telemedicine application and verified that I am speaking with the correct person using two identifiers.   I discussed the limitations of evaluation and management by telemedicine and the availability of in person appointments. The patient expressed understanding and agreed to proceed.  Tested positive for COVID on 06/15/20[place] who has remained negative to this point; Felt that last night he was having increased problems with the cough and feeling more short of breath with activity; does not have a pulse ox but is checking his blood pressure and notes it was fine;    Objective:  There were no vitals filed for this visit.  General: Well developed, well nourished, in no acute distress  Skin : Warm and dry.  Head: Normocephalic and atraumatic  Lungs: Respirations unlabored; voice is strong during call; Neurologic: Alert and oriented; speech intact; face symmetrical; moves all extremities well; CNII-XII intact without focal deficit   Assessment:  1. COVID-19   2. Cough     Plan:  Will go ahead and start Doxycycline, Prednisone and Tessalon Perles; encouraged to rest and stay hydrated; strict ER/ U/C precautions discussed since patient  is isolating at [employer] and he agrees; follow-up worse, no better;  Agree with his plan to remain isolated from family through the end of the week;  No follow-ups on file.  No orders of the defined types were placed in this encounter.   Requested Prescriptions   Signed Prescriptions Disp Refills  . doxycycline (VIBRA-TABS) 100 MG tablet 14 tablet 0    Sig: Take 1 tablet (100 mg total) by mouth 2 (two) times daily.  . predniSONE (DELTASONE) 20 MG tablet 10 tablet 0    Sig: Take 2 tablets (40 mg total) by mouth daily with breakfast.   . benzonatate (TESSALON) 100 MG capsule 20 capsule 0    Sig: Take 1 capsule (100 mg total) by mouth 3 (three) times daily as needed.

## 2020-06-26 ENCOUNTER — Telehealth (INDEPENDENT_AMBULATORY_CARE_PROVIDER_SITE_OTHER): Payer: BC Managed Care – PPO | Admitting: Internal Medicine

## 2020-06-26 DIAGNOSIS — U071 COVID-19: Secondary | ICD-10-CM

## 2020-06-26 MED ORDER — PROMETHAZINE-DM 6.25-15 MG/5ML PO SYRP: 5.0000 mL | ORAL_SOLUTION | Freq: Four times a day (QID) | ORAL | 0 refills | Status: AC | PRN

## 2020-06-26 MED ORDER — BENZONATATE 100 MG PO CAPS: 100.0000 mg | ORAL_CAPSULE | Freq: Three times a day (TID) | ORAL | 0 refills | Status: AC | PRN

## 2020-06-26 NOTE — Progress Notes (Signed)
Virtual Visit via Video Note  I connected with Jesse Stewart on 06/26/20 at  4:00 PM EST by a video enabled telemedicine application and verified that I am speaking with the correct person using two identifiers.  The patient and the provider were at separate locations throughout the entire encounter. Patient location: home, Provider location: work   I discussed the limitations of evaluation and management by telemedicine and the availability of in person appointments. The patient expressed understanding and agreed to proceed. The patient and the provider were the only parties present for the visit unless noted in HPI below.  History of Present Illness: The patient is a 55 y.o. man with visit for covid-19 positive on 06/16/20. Started on prednisone and doxycycline 06/20/20. Still having SOB with getting winded going up and down stairs with some tightness in the chest. Started 06/16/20. Has some chills still and had fevers initially with this. Cough and mostly dry cough. Denies current fevers or body aches. Overall it is improving but not quickly. Has had covid-19 and booster.   Observations/Objective: Appearance: normal, some hoarseness to voice, some coughing non-productive during visit, breathing appears normal, casual grooming, abdomen does not appear distended, throat not well visualized, mental status is A and O times 3  Assessment and Plan: See problem oriented charting  Follow Up Instructions: rx tessalon perles and promethazine/dm cough syrup and monitor over the next couple of weeks for further recovery  I discussed the assessment and treatment plan with the patient. The patient was provided an opportunity to ask questions and all were answered. The patient agreed with the plan and demonstrated an understanding of the instructions.   The patient was advised to call back or seek an in-person evaluation if the symptoms worsen or if the condition fails to improve as  anticipated.  Jesse Koch, MD

## 2020-06-27 ENCOUNTER — Encounter: Payer: Self-pay | Admitting: Internal Medicine

## 2020-06-27 NOTE — Assessment & Plan Note (Signed)
Positive test and symptom onset 06/16/20. He is outside window for contagiousness but is still having symptoms. We discussed how breathing could take several more weeks to improve as well as coughing. Watch for signs of worsening and call back for that. He has done antibiotics and prednisone without benefit noticeable so will not rx further of those. Refill tessalon perles and also rx promethazine/dm cough syrup for night time for cough.

## 2021-02-23 ENCOUNTER — Other Ambulatory Visit (HOSPITAL_BASED_OUTPATIENT_CLINIC_OR_DEPARTMENT_OTHER): Payer: Self-pay

## 2021-02-23 MED ORDER — INFLUENZA VAC SPLIT QUAD 0.5 ML IM SUSY
PREFILLED_SYRINGE | INTRAMUSCULAR | 0 refills | Status: AC
  Filled 2021-02-23: qty 0.5, 1d supply, fill #0

## 2021-03-04 IMAGING — CT CT HEART SCORING
2 series · 16 of 20 positions shown, 18 images · non-contrast
Comparison: None.

Addendum:
CLINICAL DATA: Risk stratification

EXAM:
Coronary Calcium Score
TECHNIQUE: The patient was scanned on a Siemens Force scanner. Axial
non-contrast 3 mm slices were carried out through the heart. The
data set was analyzed on a dedicated work station and scored using
the Agatson method.

[Series 3: casc 3.0 i36f 2 bestdiast 72 % · axial · 0.44mm/px · z∈[-196,-112]mm · 8 of 38 slices shown, 10 images]
[im 5/38  vessel]
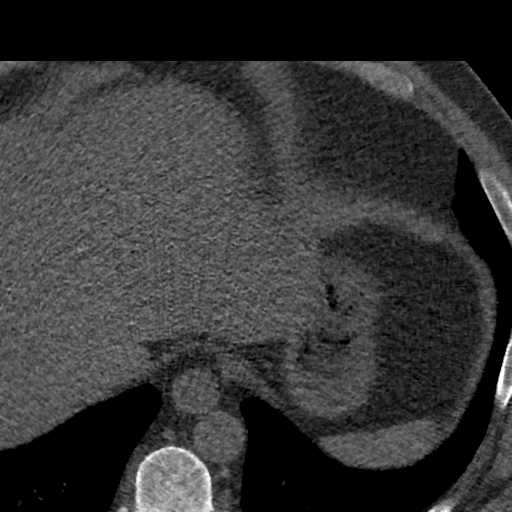
[im 5/38  lung]
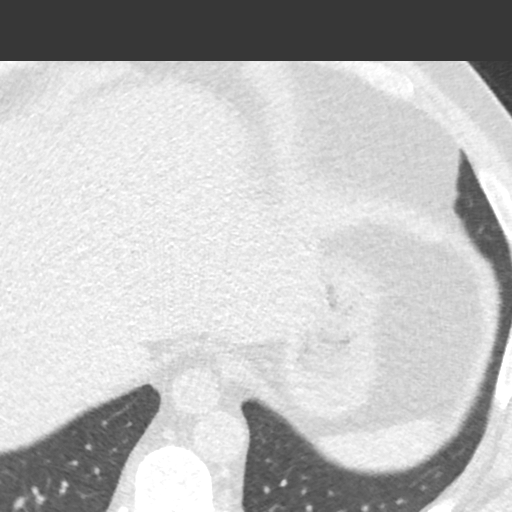
[im 9/38  vessel]
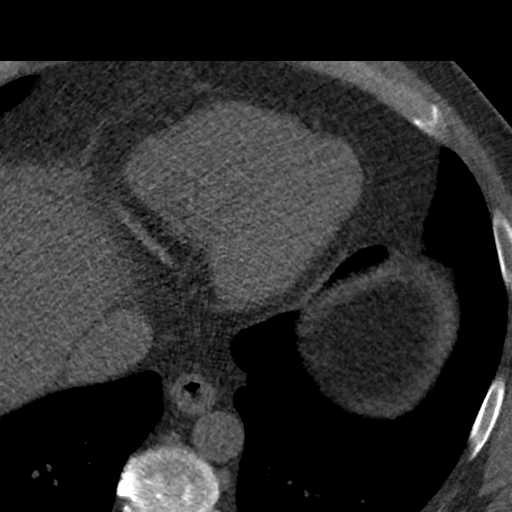
[im 13/38  vessel]
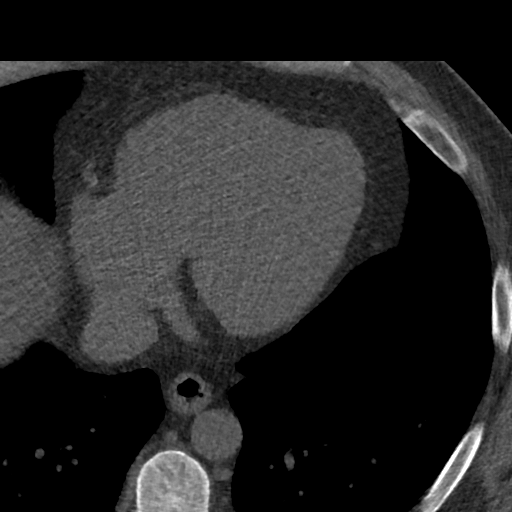
[im 17/38  vessel]
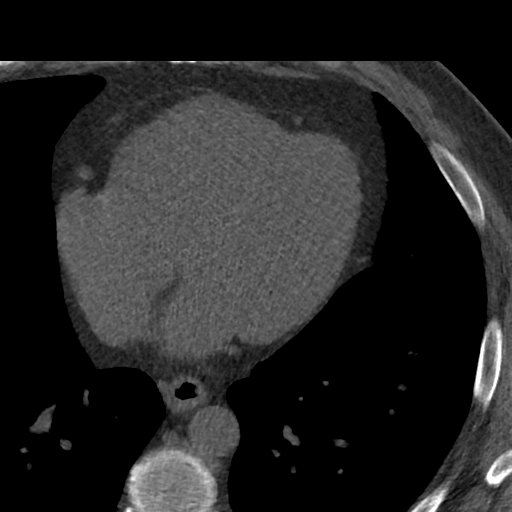
[im 21/38  vessel]
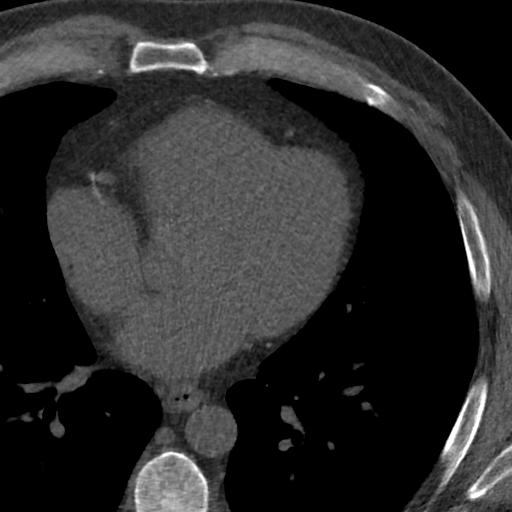
[im 21/38  lung]
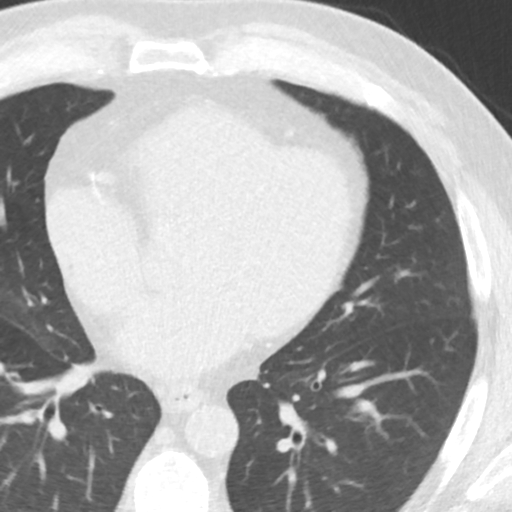
[im 25/38  vessel]
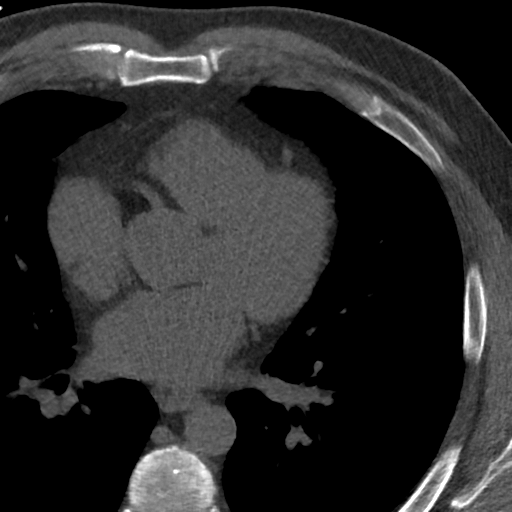
[im 29/38  vessel]
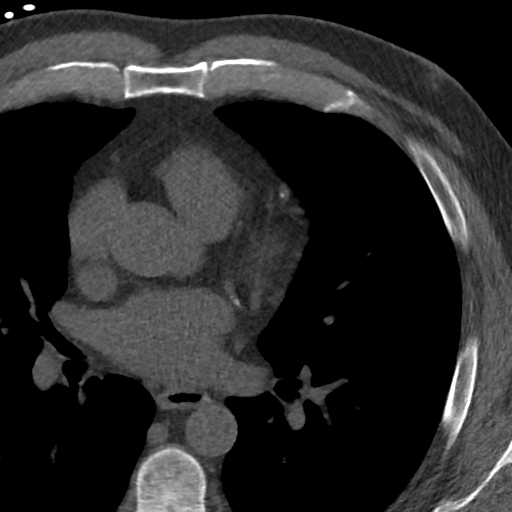
[im 33/38  vessel]
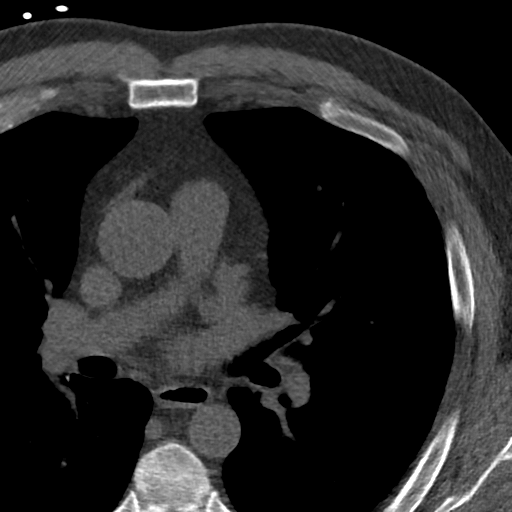

[Series 5: lung st 72 % · axial · 0.70mm/px · z∈[-196,-112]mm · 8 of 38 slices shown]
[im 5/38  lung]
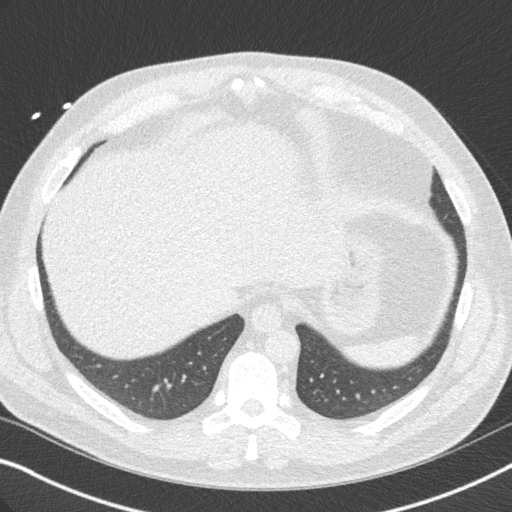
[im 9/38  lung]
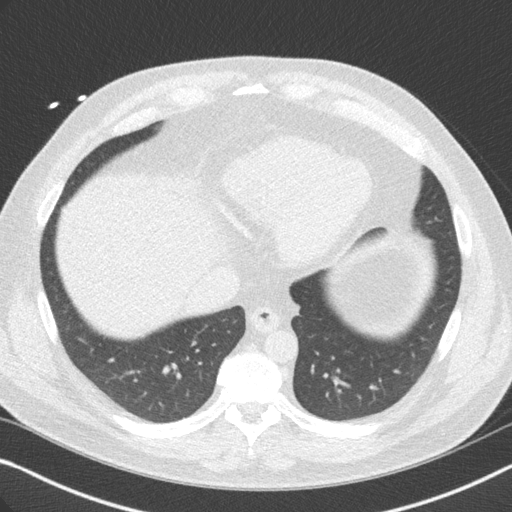
[im 13/38  lung]
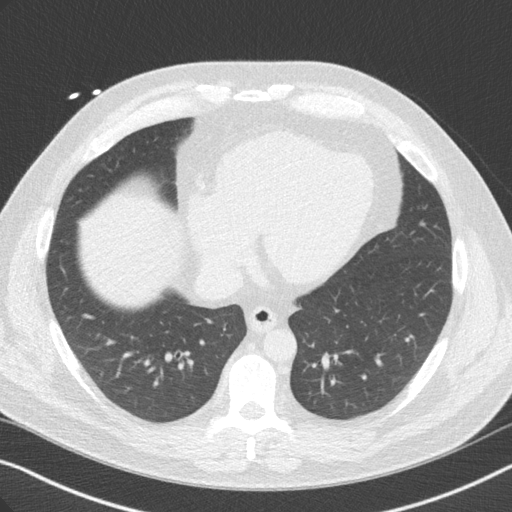
[im 17/38  lung]
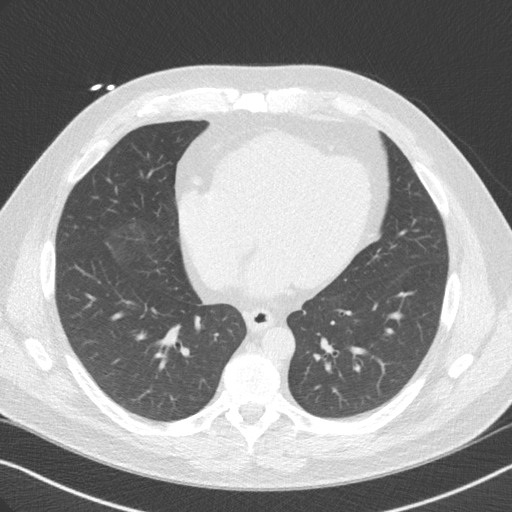
[im 21/38  lung]
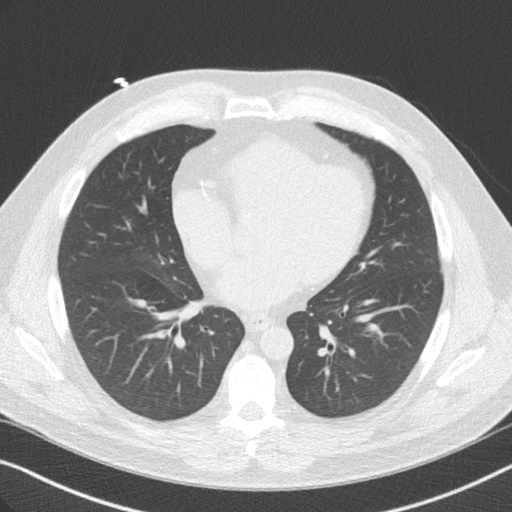
[im 25/38  lung]
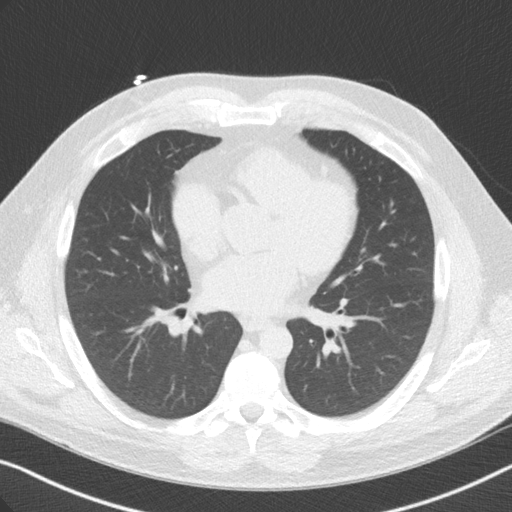
[im 29/38  lung]
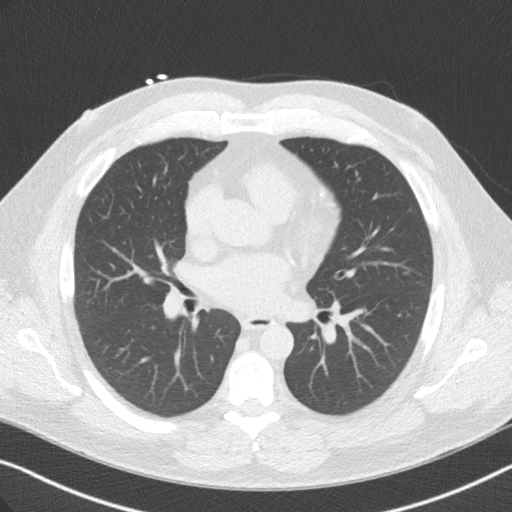
[im 33/38  lung]
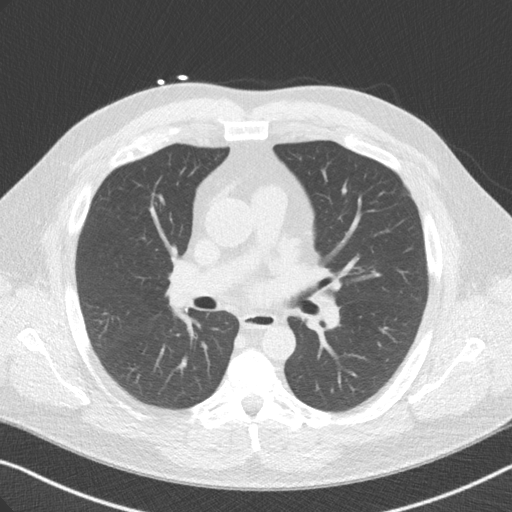

[16 of 20 positions shown; findings below may reference images not displayed]

FINDINGS: Non-cardiac: See separate report from [REDACTED].

Ascending Aorta: Normal Caliber.  No calcifications.

Pericardium: Normal

Coronary arteries: Normal coronary origins. Coronary artery
calcifications in the LAD, LCX and RCA.
IMPRESSION: Coronary calcium score of 96. This was 81st percentile for age and
sex matched control.

Kj Peluso

EXAM:
OVER-READ INTERPRETATION  CT CHEST

The following report is an over-read performed by radiologist Dr.
over-read does not include interpretation of cardiac or coronary
anatomy or pathology. The coronary calcium CT interpretation by the
cardiologist is attached.
FINDINGS: Limited view of the lung parenchyma demonstrates a 7 mm rounded
lesion in the RIGHT upper lobe (image [DATE]). Airways are normal.

Limited view of the mediastinum demonstrates no adenopathy.
Esophagus normal.

Limited view of the upper abdomen unremarkable.

Limited view of the skeleton and chest wall is unremarkable.
IMPRESSION: Rounded nodularity in the RIGHT upper lobe is incompletely imaged.
This may represent partial imaging of a vessel. Cannot exclude
pulmonary nodule. Recommend CT of the thorax for further evaluation.

These results will be called to the ordering clinician or
representative by the Radiologist Assistant, and communication
documented in the PACS or zVision Dashboard.

*** End of Addendum ***
FINDINGS: Non-cardiac: See separate report from [REDACTED].

Ascending Aorta: Normal Caliber.  No calcifications.

Pericardium: Normal

Coronary arteries: Normal coronary origins. Coronary artery
calcifications in the LAD, LCX and RCA.
IMPRESSION: Coronary calcium score of 96. This was 81st percentile for age and
sex matched control.

Kj Peluso

## 2021-03-18 IMAGING — CT CT CHEST W/O CM
2 of 3 series · 15 of 36 positions shown, 18 images · non-contrast
Comparison: Cardiac CT on 05/20/2019

CLINICAL DATA: Right lung nodule incompletely visualized on recent
cardiac CT.

EXAM:
CT CHEST WITHOUT CONTRAST
TECHNIQUE: Multidetector CT imaging of the chest was performed following the
standard protocol without IV contrast.

[Series 2: thorax · axial · 0.77mm/px · z∈[-298,-26]mm · 12 of 160 slices shown, 15 images]
[im 12/160  mediastinal]
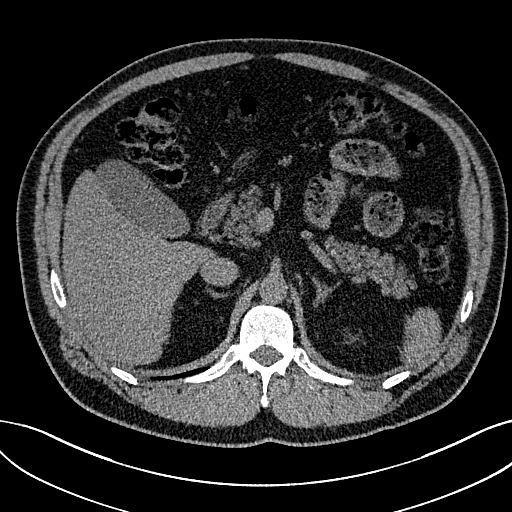
[im 12/160  lung]
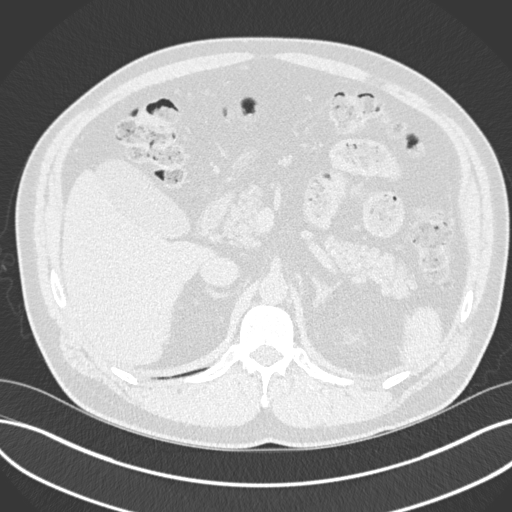
[im 24/160  lung]
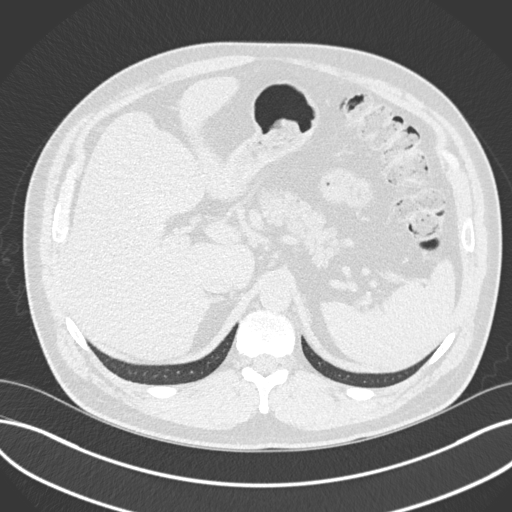
[im 36/160  lung]
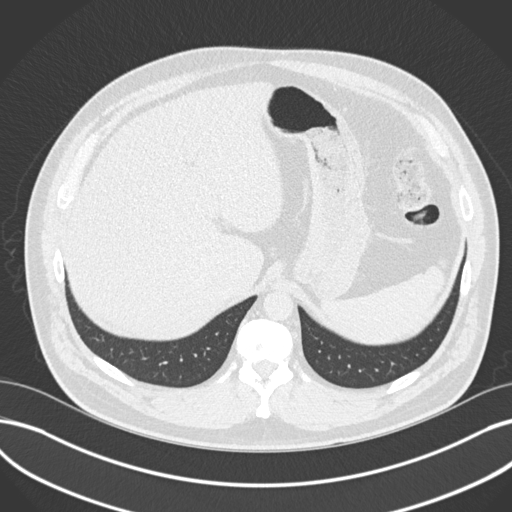
[im 48/160  lung]
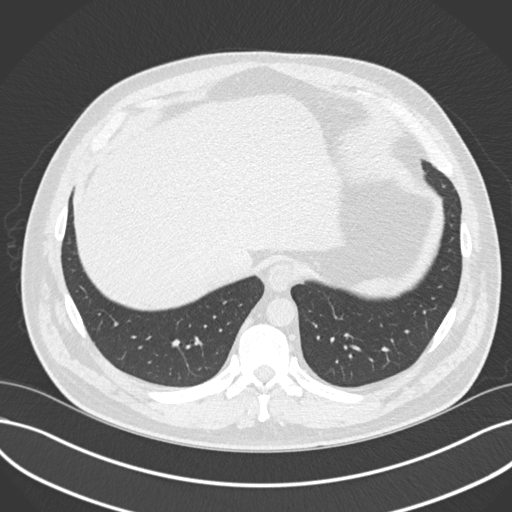
[im 59/160  mediastinal]
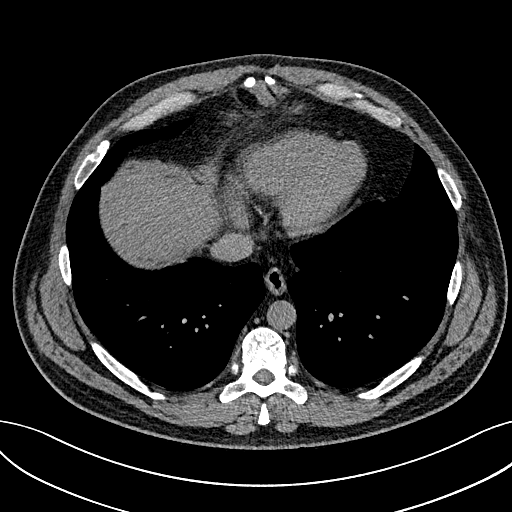
[im 59/160  lung]
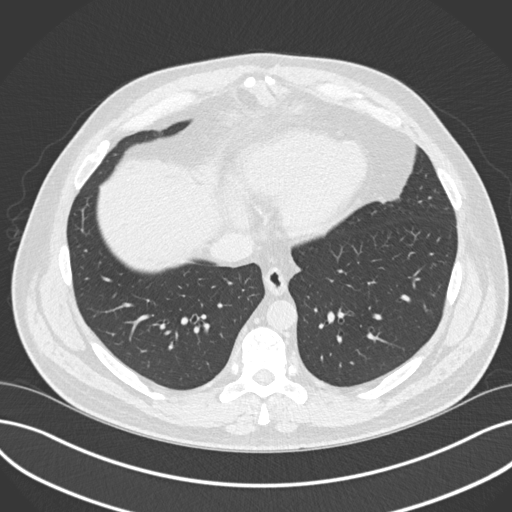
[im 71/160  lung]
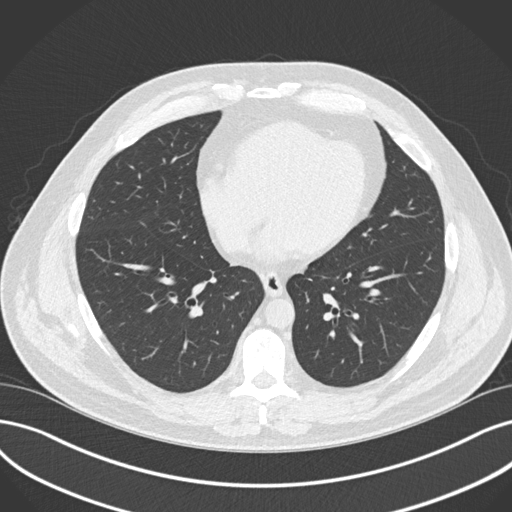
[im 89/160  lung]
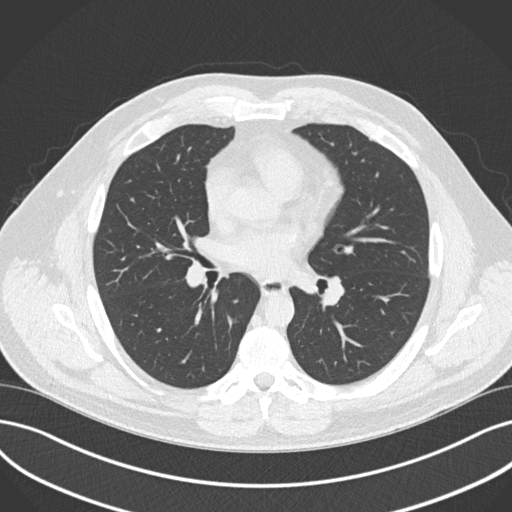
[im 101/160  lung]
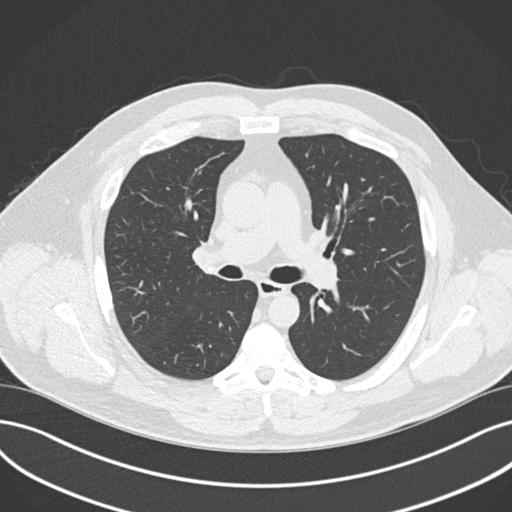
[im 112/160  mediastinal]
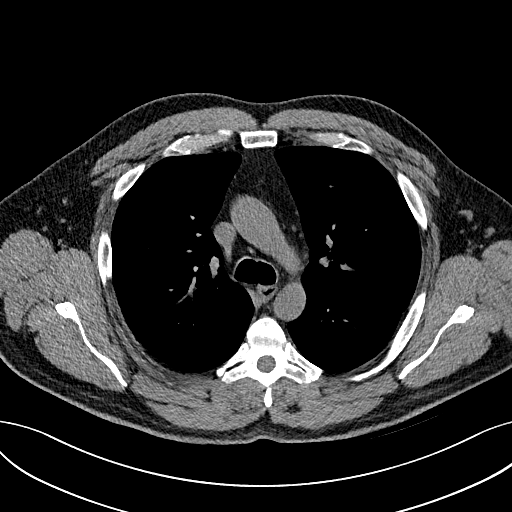
[im 112/160  lung]
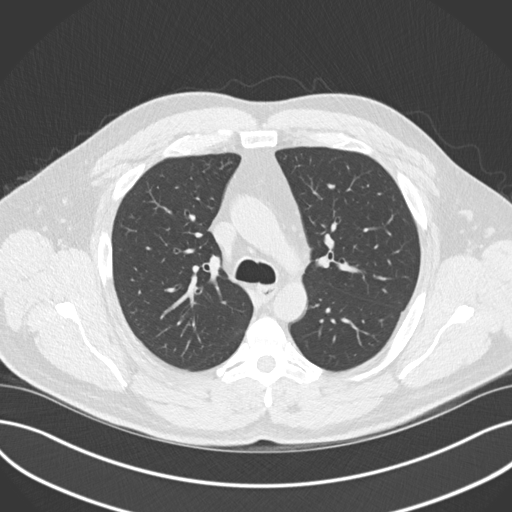
[im 124/160  lung]
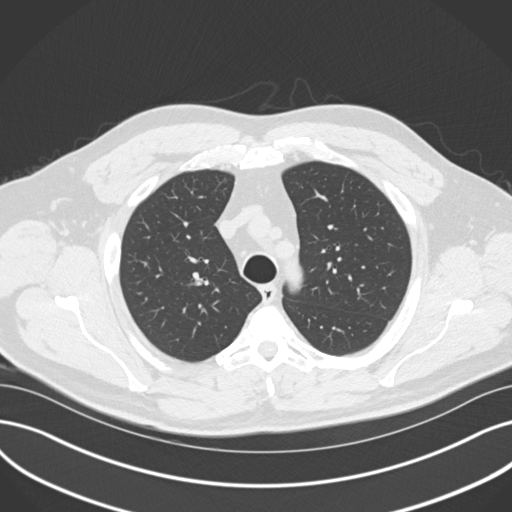
[im 136/160  lung]
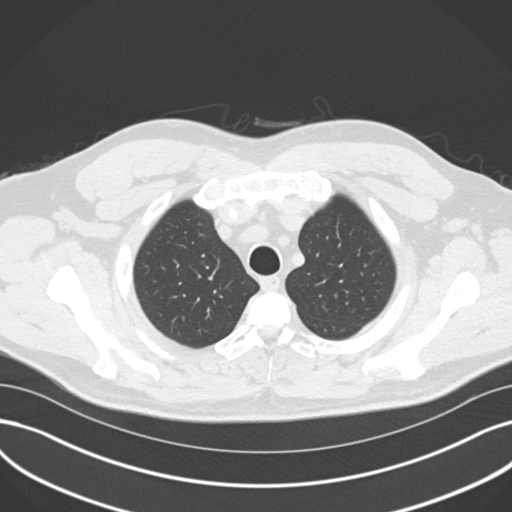
[im 148/160  lung]
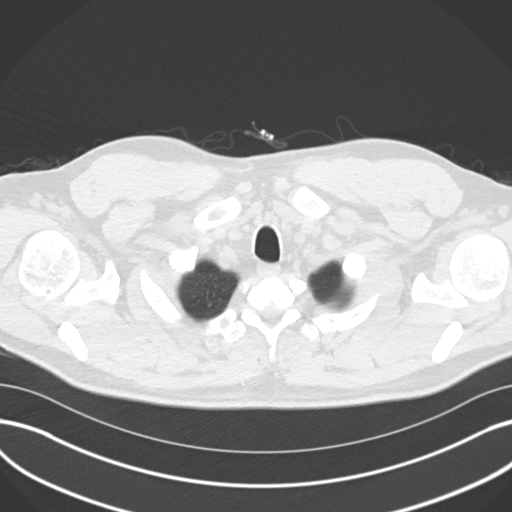

[Series 5: coronal · coronal · 0.62mm/px · 3 of 151 slices shown]
[im 31/151  lung]
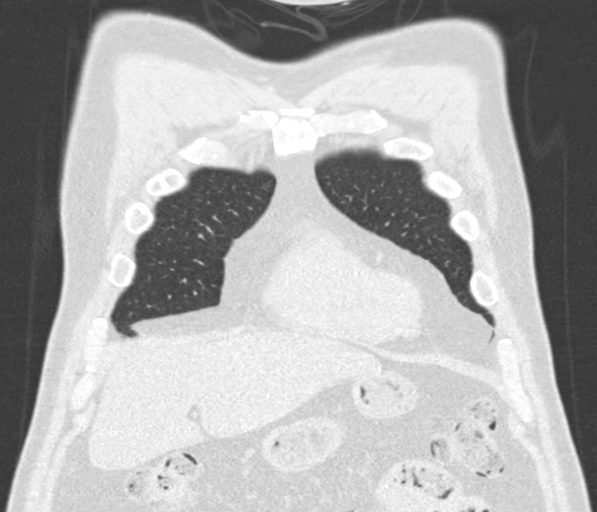
[im 61/151  lung]
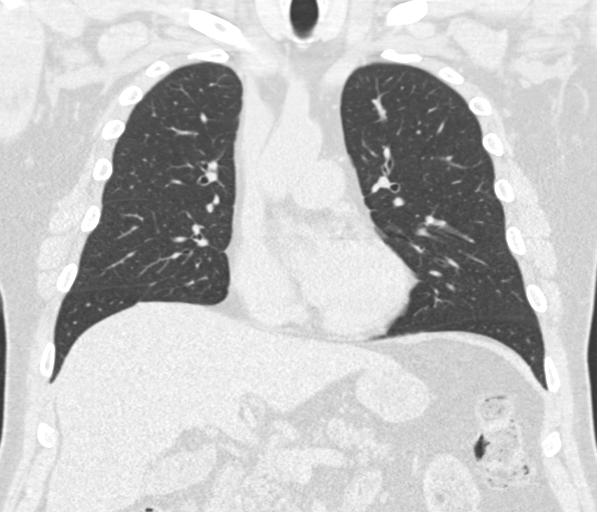
[im 91/151  lung]
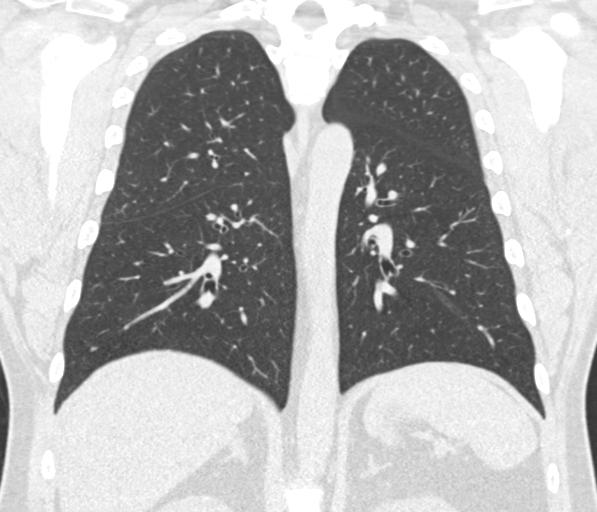

[15 of 36 positions shown; findings below may reference images not displayed]

FINDINGS: Cardiovascular: No acute findings. Aortic and coronary artery
atherosclerosis incidentally noted.

Mediastinum/Nodes: No masses or pathologically enlarged lymph nodes
identified on this unenhanced exam.

Lungs/Pleura: A 10 mm pulmonary nodule is seen in the inferior right
upper lobe on image 55/3. This nodule contains macroscopic fat and
focal calcification, consistent with a benign hamartoma. No other
suspicious pulmonary nodules or masses are identified. No evidence
of pulmonary infiltrate or pleural effusion.

Upper Abdomen:  Unremarkable.

Musculoskeletal:  No suspicious bone lesions.
IMPRESSION: 10 mm benign hamartoma in the right upper lobe.

## 2022-03-13 DIAGNOSIS — C3490 Malignant neoplasm of unspecified part of unspecified bronchus or lung: Secondary | ICD-10-CM

## 2022-03-13 HISTORY — DX: Malignant neoplasm of unspecified part of unspecified bronchus or lung: C34.90

## 2022-03-15 ENCOUNTER — Other Ambulatory Visit (HOSPITAL_BASED_OUTPATIENT_CLINIC_OR_DEPARTMENT_OTHER): Payer: Self-pay

## 2022-03-15 MED ORDER — INFLUENZA VAC SPLIT QUAD 0.5 ML IM SUSY
PREFILLED_SYRINGE | INTRAMUSCULAR | 0 refills | Status: AC
  Filled 2022-03-15: qty 0.5, 1d supply, fill #0

## 2022-03-27 HISTORY — PX: LUNG REMOVAL, PARTIAL: SHX233

## 2022-04-18 ENCOUNTER — Other Ambulatory Visit (HOSPITAL_BASED_OUTPATIENT_CLINIC_OR_DEPARTMENT_OTHER): Payer: Self-pay

## 2022-04-18 MED ORDER — GABAPENTIN 300 MG PO CAPS
300.0000 mg | ORAL_CAPSULE | Freq: Three times a day (TID) | ORAL | 1 refills | Status: AC
  Filled 2022-04-18: qty 90, 30d supply, fill #0
  Filled 2022-05-15: qty 90, 30d supply, fill #1

## 2022-05-15 ENCOUNTER — Other Ambulatory Visit (HOSPITAL_BASED_OUTPATIENT_CLINIC_OR_DEPARTMENT_OTHER): Payer: Self-pay

## 2022-06-10 ENCOUNTER — Other Ambulatory Visit (HOSPITAL_BASED_OUTPATIENT_CLINIC_OR_DEPARTMENT_OTHER): Payer: Self-pay

## 2022-06-11 ENCOUNTER — Other Ambulatory Visit (HOSPITAL_BASED_OUTPATIENT_CLINIC_OR_DEPARTMENT_OTHER): Payer: Self-pay

## 2022-06-11 MED ORDER — GLUCOSE BLOOD VI STRP
ORAL_STRIP | 2 refills | Status: AC
  Filled 2022-06-11: qty 100, 50d supply, fill #0
  Filled 2022-07-27: qty 100, 50d supply, fill #1

## 2022-06-11 MED ORDER — ONETOUCH DELICA PLUS LANCET33G MISC
2 refills | Status: AC
  Filled 2022-06-11: qty 100, 50d supply, fill #0
  Filled 2022-07-27: qty 100, 50d supply, fill #1

## 2022-07-27 ENCOUNTER — Other Ambulatory Visit (HOSPITAL_BASED_OUTPATIENT_CLINIC_OR_DEPARTMENT_OTHER): Payer: Self-pay

## 2023-04-29 HISTORY — PX: TOTAL KNEE ARTHROPLASTY: SHX125

## 2023-05-20 NOTE — Therapy (Signed)
 OUTPATIENT PHYSICAL THERAPY LOWER EXTREMITY EVALUATION   Patient Name: Jesse Stewart MRN: [id number] DOB:Dec 03, 1965, 58 y.o., male Today's Date: 05/21/2023  END OF SESSION:  PT End of Session - 05/21/23 1103     Visit Number 1    Date for PT Re-Evaluation 07/16/23    Authorization Type [facility] visits approved 12/20-4/19/25    Authorization - Visit Number 1    Authorization - Number of Visits 15    PT Start Time 1017    PT Stop Time 1107    PT Time Calculation (min) 50 min    Activity Tolerance Patient tolerated treatment well    Behavior During Therapy [facility] for tasks assessed/performed             Past Medical History:  Diagnosis Date   Arthritis    Hypertension    Lung cancer (HCC) 03/13/2022   Past Surgical History:  Procedure Laterality Date   KNEE ARTHROSCOPY W/ MENISCAL REPAIR     LUNG REMOVAL, PARTIAL Right 03/27/2022   TOTAL KNEE ARTHROPLASTY Left 04/29/2023   Patient Active Problem List   Diagnosis Date Noted   Colon polyps 05/16/2019   Obesity (BMI 30.0-34.9) 05/13/2019   Chest tightness 10/06/2014   COVID 10/06/2014   Hematochezia 12/07/2012   Hernia of abdominal wall 12/07/2012   Well adult exam 04/23/2012   Neoplasm of uncertain behavior of skin 05/04/2010   ACTINIC KERATOSIS 05/04/2010   Dyslipidemia 04/04/2008   GERD 04/04/2008   Osteoarthritis 04/04/2008   KNEE PAIN 04/04/2008   ABNORMAL GLUCOSE NEC 04/04/2008   ABNORMAL LIVER FUNCTION TESTS 04/04/2008   Essential hypertension 12/29/2006    PCP: Garald Karlynn GAILS, MD   REFERRING PROVIDER: Sedalia Carrier, PA  REFERRING DIAG: Left TKA 04/29/2023   THERAPY DIAG:  Acute pain of left knee - Plan: PT plan of care cert/re-cert  Localized edema - Plan: PT plan of care cert/re-cert  Other abnormalities of gait and mobility - Plan: PT plan of care cert/re-cert  Muscle weakness (generalized) - Plan: PT plan of care cert/re-cert  Rationale for Evaluation and Treatment:  Rehabilitation  ONSET DATE: surgery 04/29/23  SUBJECTIVE:   SUBJECTIVE STATEMENT: Pt presents to PT s/p Lt TKA performed on 04/29/23 due to OA in the Lt. Pt had home health PT  PERTINENT HISTORY: Lt TKA 04/29/23, Lung cancer with lung resection 03/2022 PAIN: 05/21/23:  Are you having pain? Yes: NPRS scale: 5-7/10 Pain location: Lt knee  Pain description: throbbing  Aggravating factors: walking, standing  Relieving factors: ice, pain medication, rest   PRECAUTIONS: Other: history of lung cancer   RED FLAGS: None   WEIGHT BEARING RESTRICTIONS: No  FALLS:  Has patient fallen in last 6 months? 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  PATIENT SURVEYS:  05/21/23: FOTO 44 (goal is 75)  COGNITION: Overall cognitive status: Within functional limits for tasks assessed     SENSATION: WFL  EDEMA:  Circumferential: Lt midpatellar 16.5 inches, Rt 15 inches    POSTURE: No Significant postural limitations  PALPATION:  Healing surgical incision with steri strip like covering over distal aspect.  One area over proximal aspect that looks like it might open.  PT educated pt and his wife when to seek medical attention.  LOWER EXTREMITY ROM:  Active ROM Right eval Left eval  Hip flexion Full ROM Full hip ROM  Hip extension    Hip abduction    Hip adduction    Hip internal rotation    Hip external rotation    Knee flexion  65  Knee extension  10   Ankle dorsiflexion    Ankle plantarflexion    Ankle inversion    Ankle eversion     (Blank rows = not tested)  LOWER EXTREMITY MMT:  MMT Right eval Left eval  Hip flexion 5/5 throughout  4-  Hip extension  4-  Hip abduction    Hip adduction    Hip internal rotation    Hip external rotation    Knee flexion  4  Knee extension  4-  Ankle dorsiflexion  5  Ankle plantarflexion    Ankle inversion    Ankle eversion     (Blank rows = not tested)   GAIT: Distance walked: 50 Assistive device utilized: Single point cane Level of assistance: Complete Independence Comments: reduced Lt knee flexion with swing through.  Pelvic compensation with ascending and descending steps due to limited Lt knee flexion                                                                                                                   TREATMENT DATE:  05/21/23:  HEP established- see below   NuStep: level 5x 8 minutes- hold in flexion  Game Ready: 3 snowflakes, med compression x10 min    PATIENT EDUCATION:  Education details: Access Code: [email or web address] Person educated: Patient Education method: Explanation, Demonstration, and Handouts Education comprehension: verbalized understanding and returned demonstration  HOME EXERCISE PROGRAM: Access Code: [email or web address] URL: [email or web address] Date: 05/21/2023 Prepared by: Burnard  Exercises - Seated Knee Flexion AAROM  - 3-5 x daily - 7 x weekly - 1 sets - 10 reps - 5-10 hold - Standing Knee Flexion Stretch on Step  - 3-5 x daily - 7 x weekly - 1 sets - 10 reps - 5-10 hold - Supine Quad Set  - 2 x daily - 7 x weekly - 1 sets - 10 reps - 5 hold  ASSESSMENT:  CLINICAL IMPRESSION: Patient is a 58 y.o. male  who was seen today for physical therapy evaluation and treatment for s/p TKA on 04/29/23. He had home health PT that ended yesterday.  He ambulates all distances with a single point cane, reduced knee flexion with swing through and compensation  with steps due to limited Lt knee A/ROM.  Lt knee A/ROM -10 to 65 degrees.  PT emphasized importance of consistent stretching at home to gain more flexion needed for function.  Edema is appropriate for current status post-op.  Incision is healing and pt and his wife educated on scar  management.  Patient will benefit from skilled PT to address the below impairments and improve overall function.   OBJECTIVE IMPAIRMENTS: decreased activity tolerance, decreased coordination, decreased endurance, decreased mobility, difficulty walking, decreased ROM, decreased strength, hypomobility, increased edema, impaired flexibility, and pain.   ACTIVITY LIMITATIONS: sitting, standing, squatting, stairs, transfers, bathing, dressing, hygiene/grooming, and locomotion level  PARTICIPATION LIMITATIONS: cleaning, driving, community activity, occupation, and yard work  PERSONAL FACTORS: 1-2 comorbidities: Lt TKA, lung cancer   are also affecting patient's functional outcome.   REHAB POTENTIAL: Good  CLINICAL DECISION MAKING: Stable/uncomplicated  EVALUATION COMPLEXITY: Low   GOALS: Goals reviewed with patient? Yes  SHORT TERM GOALS: Target date: 06/18/2023   Be independent in initial HEP Baseline: Goal status: INITIAL  2.  Demonstrate Lt knee A/ROM flexion to > or = to 95 degrees to allow for sitting and negotiating steps without compensation  Baseline: 65 Goal status: INITIAL  3.  Improve FOTO to > or = to 50  Baseline: 44 Goal status: INITIAL  4.   Improve strength to ascend steps with step-over step gait with use of rail to facilitate return to work Baseline:  Goal status: INITIAL  5.  Stand and walk > or = to 1 hour without limitation due to pain or fatigue  Baseline:  Goal status: INITIAL   LONG TERM GOALS: Target date: 07/16/2023    Be independent in advanced HEP Baseline:  Goal status: INITIAL  2.  Improve FOTO to > or = to 75 Baseline: 44 Goal status: INITIAL  3.  Demonstrate Lt  knee A/ROM flexion to > or = to 110 degrees to allow steps and squatting without compensation Baseline: 65 Goal status: INITIAL  4.  Demonstrate symmetry with ambulation on level surface due to increased strength and ROM Baseline:  Goal status: INITIAL  5.  Improve strength to negotiate steps with step-over step gait with use of rail to facilitate return to work Baseline:  Goal status: INITIAL  6.  Stand and walk > or = to 1.5 hours without limitation due to pain or fatigue  Baseline:  Goal status: INITIAL   PLAN:  PT FREQUENCY: 2x/week  PT DURATION: 8 weeks  PLANNED INTERVENTIONS: 97110-Therapeutic exercises, 97530- Therapeutic activity, 97112- Neuromuscular re-education, 97535- Self Care, 02859- Manual therapy, [phone]- Gait training, [phone]- Aquatic Therapy, 97014- Electrical stimulation (unattended), [id number]- Electrical stimulation (manual), 97016- Vasopneumatic device, 97035- Ultrasound, Patient/Family education, Balance training, Taping, Joint mobilization, Joint manipulation, Scar mobilization, Vestibular training, and Cryotherapy  PLAN FOR NEXT SESSION: Lt knee ROM, strength, edema management and gait, manual for ROM gains, game ready   Burnard Joy, PT 05/21/23 12:04 PM   [facility] [address], [address] [place], [place] [address] Phone # [phone] Fax [phone]

## 2023-05-21 ENCOUNTER — Ambulatory Visit: Payer: No Typology Code available for payment source | Attending: Physician Assistant

## 2023-05-21 ENCOUNTER — Other Ambulatory Visit: Payer: Self-pay

## 2023-05-21 DIAGNOSIS — Z471 Aftercare following joint replacement surgery: Secondary | ICD-10-CM | POA: Diagnosis present

## 2023-05-21 DIAGNOSIS — R2689 Other abnormalities of gait and mobility: Secondary | ICD-10-CM | POA: Insufficient documentation

## 2023-05-21 DIAGNOSIS — M25562 Pain in left knee: Secondary | ICD-10-CM | POA: Insufficient documentation

## 2023-05-21 DIAGNOSIS — M6281 Muscle weakness (generalized): Secondary | ICD-10-CM | POA: Insufficient documentation

## 2023-05-21 DIAGNOSIS — Z96652 Presence of left artificial knee joint: Secondary | ICD-10-CM | POA: Insufficient documentation

## 2023-05-21 DIAGNOSIS — R600 Localized edema: Secondary | ICD-10-CM | POA: Diagnosis not present

## 2023-05-22 NOTE — Therapy (Signed)
 OUTPATIENT PHYSICAL THERAPY LOWER EXTREMITY TREATMENT   Patient Name: Jesse Stewart MRN: [id number] DOB:02/26/1966, 57 y.o., male Today's Date: 05/23/2023  END OF SESSION:  PT End of Session - 05/23/23 0932     Visit Number 2    Date for PT Re-Evaluation 07/16/23    Authorization Type [facility]-15 visits approved 12/20-4/19/25    Authorization - Visit Number 2    Authorization - Number of Visits 15    PT Start Time 0930    PT Stop Time 1014    PT Time Calculation (min) 44 min    Activity Tolerance Patient tolerated treatment well    Behavior During Therapy [facility] for tasks assessed/performed              Past Medical History:  Diagnosis Date   Arthritis    Hypertension    Lung cancer (HCC) 03/13/2022   Past Surgical History:  Procedure Laterality Date   KNEE ARTHROSCOPY W/ MENISCAL REPAIR     LUNG REMOVAL, PARTIAL Right 03/27/2022   TOTAL KNEE ARTHROPLASTY Left 04/29/2023   Patient Active Problem List   Diagnosis Date Noted   Colon polyps 05/16/2019   Obesity (BMI 30.0-34.9) 05/13/2019   Chest tightness 10/06/2014   COVID 10/06/2014   Hematochezia 12/07/2012   Hernia of abdominal wall 12/07/2012   Well adult exam 04/23/2012   Neoplasm of uncertain behavior of skin 05/04/2010   ACTINIC KERATOSIS 05/04/2010   Dyslipidemia 04/04/2008   GERD 04/04/2008   Osteoarthritis 04/04/2008   KNEE PAIN 04/04/2008   ABNORMAL GLUCOSE NEC 04/04/2008   ABNORMAL LIVER FUNCTION TESTS 04/04/2008   Essential hypertension 12/29/2006    PCP: Garald Karlynn GAILS, MD   REFERRING PROVIDER: Sedalia Carrier, PA  REFERRING DIAG: Left TKA 04/29/2023   THERAPY DIAG:  Acute pain of left knee  Localized edema  Other abnormalities of gait and mobility  Muscle weakness (generalized)  Rationale for Evaluation and Treatment: Rehabilitation  ONSET DATE: surgery 04/29/23  SUBJECTIVE:   SUBJECTIVE STATEMENT: I was on it quite a bit yesterday.   PERTINENT HISTORY: Lt TKA  04/29/23, Lung cancer with lung resection 03/2022 PAIN: 05/21/23:  Are you having pain? Yes: NPRS scale: 6/10 Pain location: Lt knee  Pain description: throbbing  Aggravating factors: walking, standing  Relieving factors: ice, pain medication, rest   PRECAUTIONS: Other: history of lung cancer   RED FLAGS: None   WEIGHT BEARING RESTRICTIONS: No  FALLS:  Has patient fallen in last 6 months? 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  PATIENT SURVEYS:  05/21/23: FOTO 44 (goal is 75)  COGNITION: Overall cognitive status: Within functional limits for tasks assessed     SENSATION: WFL  EDEMA:  Circumferential: Lt midpatellar 16.5 inches, Rt 15 inches    POSTURE: No Significant postural limitations  PALPATION: Healing surgical incision with steri strip like covering over distal aspect.  One area over proximal aspect that looks like it might open.  PT educated pt and his wife when to seek medical attention.  LOWER EXTREMITY ROM:  Active ROM Right eval Left eval  Hip flexion Full ROM Full hip ROM  Hip extension    Hip abduction    Hip adduction    Hip internal rotation    Hip external rotation    Knee flexion  65  Knee extension  10  Ankle dorsiflexion    Ankle plantarflexion    Ankle inversion    Ankle eversion     (Blank rows = not tested)  LOWER EXTREMITY MMT:  MMT Right eval Left eval  Hip flexion 5/5 throughout  4-  Hip extension   4-  Hip abduction    Hip adduction    Hip internal rotation    Hip external rotation    Knee flexion  4  Knee extension  4-  Ankle dorsiflexion  5  Ankle plantarflexion    Ankle inversion    Ankle eversion     (Blank rows = not tested)   GAIT: Distance walked: 50 Assistive device utilized: Single point cane Level of assistance: Complete Independence Comments: reduced Lt knee flexion with swing through.  Pelvic compensation with ascending and descending steps due to limited Lt knee flexion                                                                                                                   TREATMENT DATE:  05/23/23 Nustep L5 x 6 min Standing knee flexion stretch on 2nd step Standing HS stretch with foot on 1st step and overpressure above knee 2x 20 sec Standing TKE into ball 5 sec hold x 20 Standing hip ABD, ext  x 20 ea B Mini squat x 20 Rockerboard 10 sec hold in DF then release x 20 Seated knee flexion stretch Manual: patellar mobs, seated passive flexion with contralateral knee ext Supine heel slide with left leg off mat foot on slider and strap assist x 20 SLR x 20 HEP update   05/21/23:  HEP established- see below   NuStep: level 5x 8 minutes- hold in flexion  Game Ready: 3 snowflakes, med compression x10 min    PATIENT EDUCATION:  Education details: Access Code: [email or web address] Person educated: Patient Education method: Explanation, Demonstration, and Handouts Education comprehension: verbalized understanding and returned demonstration  HOME EXERCISE PROGRAM: Access Code: [email or web address] URL: [email or web address] Date: 05/23/2023 Prepared by: Mliss  Exercises - Seated Knee Flexion AAROM  - 3-5 x daily - 7 x weekly - 1 sets - 10 reps - 5-10 hold - Standing Knee Flexion Stretch on Step  - 3-5 x daily - 7 x weekly - 1 sets - 10 reps - 5-10 hold - Supine Quad Set  - 2 x daily - 7 x weekly - 1 sets - 10 reps - 5 hold - Supine Heel Slide with  Strap  - 2 x daily - 7 x weekly - 1-2 sets - 10 reps - Standing Hip Abduction with Resistance at Ankles and Counter Support  - 1 x daily - 3-4 x weekly - 3 sets - 10 reps -  Standing Hip Extension with Resistance at Ankles and Counter Support  - 1 x daily - 3-4 x weekly - 3 sets - 10 reps  ASSESSMENT:  CLINICAL IMPRESSION: Patient was able to tolerate all exercises without significant increase in pain. PROM into flexion was very painful to patient. Supine heel slide with strap was very effective and patient tolerated well. Encouraged to focus on ROM as much as possible as long as pain does not increase too significantly. Patient chose to ice at home today.  OBJECTIVE IMPAIRMENTS: decreased activity tolerance, decreased coordination, decreased endurance, decreased mobility, difficulty walking, decreased ROM, decreased strength, hypomobility, increased edema, impaired flexibility, and pain.   ACTIVITY LIMITATIONS: sitting, standing, squatting, stairs, transfers, bathing, dressing, hygiene/grooming, and locomotion level  PARTICIPATION LIMITATIONS: cleaning, driving, community activity, occupation, and yard work  PERSONAL FACTORS: 1-2 comorbidities: Lt TKA, lung cancer   are also affecting patient's functional outcome.   REHAB POTENTIAL: Good  CLINICAL DECISION MAKING: Stable/uncomplicated  EVALUATION COMPLEXITY: Low   GOALS: Goals reviewed with patient? Yes  SHORT TERM GOALS: Target date: 06/18/2023   Be independent in initial HEP Baseline: Goal status: INITIAL  2.  Demonstrate Lt knee A/ROM flexion to > or = to 95 degrees to allow for sitting and negotiating steps without compensation  Baseline: 65 Goal status: INITIAL  3.  Improve FOTO to > or = to 50  Baseline: 44 Goal status: INITIAL  4.   Improve strength to ascend steps with step-over step gait with use of rail to facilitate return to work Baseline:  Goal status: INITIAL  5.  Stand and walk > or = to 1 hour without  limitation due to pain or fatigue  Baseline:  Goal status: INITIAL   LONG TERM GOALS: Target date: 07/16/2023    Be independent in advanced HEP Baseline:  Goal status: INITIAL  2.  Improve FOTO to > or = to 75 Baseline: 44 Goal status: INITIAL  3.  Demonstrate Lt knee A/ROM flexion to > or = to 110 degrees to allow steps and squatting without compensation Baseline: 65 Goal status: INITIAL  4.  Demonstrate symmetry with ambulation on level surface due to increased strength and ROM Baseline:  Goal status: INITIAL  5.  Improve strength to negotiate steps with step-over step gait with use of rail to facilitate return to work Baseline:  Goal status: INITIAL  6.  Stand and walk > or = to 1.5 hours without limitation due to pain or fatigue  Baseline:  Goal status: INITIAL   PLAN:  PT FREQUENCY: 2x/week  PT DURATION: 8 weeks  PLANNED INTERVENTIONS: 97110-Therapeutic exercises, 97530- Therapeutic activity, 97112- Neuromuscular re-education, 97535- Self Care, 02859- Manual therapy, [phone]- Gait training, [phone]- Aquatic Therapy, 97014- Electrical stimulation (unattended), [id number]- Electrical stimulation (manual), 97016- Vasopneumatic device, 97035- Ultrasound, Patient/Family education, Balance training, Taping, Joint mobilization, Joint manipulation, Scar mobilization, Vestibular training, and Cryotherapy  PLAN FOR NEXT SESSION: Lt knee ROM, strength, edema management and gait, manual for ROM gains, game ready   Mliss Cummins, PT  05/23/23 10:17 AM   [facility] [address] [place], [place] [address] Phone # [phone] Fax [phone]

## 2023-05-23 ENCOUNTER — Encounter: Payer: Self-pay | Admitting: Physical Therapy

## 2023-05-23 ENCOUNTER — Ambulatory Visit: Payer: No Typology Code available for payment source | Admitting: Physical Therapy

## 2023-05-23 DIAGNOSIS — M6281 Muscle weakness (generalized): Secondary | ICD-10-CM

## 2023-05-23 DIAGNOSIS — Z471 Aftercare following joint replacement surgery: Secondary | ICD-10-CM | POA: Diagnosis not present

## 2023-05-23 DIAGNOSIS — R600 Localized edema: Secondary | ICD-10-CM

## 2023-05-23 DIAGNOSIS — R2689 Other abnormalities of gait and mobility: Secondary | ICD-10-CM

## 2023-05-23 DIAGNOSIS — M25562 Pain in left knee: Secondary | ICD-10-CM

## 2023-05-25 NOTE — Therapy (Signed)
 OUTPATIENT PHYSICAL THERAPY LOWER EXTREMITY TREATMENT   Patient Name: Jesse Stewart MRN: [id number] DOB:1965/10/19, 58 y.o., male Today's Date: 05/26/2023  END OF SESSION:  PT End of Session - 05/26/23 0929     Visit Number 3    Date for PT Re-Evaluation 07/16/23    Authorization Type [employer]-[facility] visits approved 12/20-4/19/25    Authorization - Visit Number 3    Authorization - Number of Visits 15    PT Start Time 0928    PT Stop Time 1014    PT Time Calculation (min) 46 min    Activity Tolerance Patient tolerated treatment well    Behavior During Therapy [facility] for tasks assessed/performed               Past Medical History:  Diagnosis Date   Arthritis    Hypertension    Lung cancer (HCC) 03/13/2022   Past Surgical History:  Procedure Laterality Date   KNEE ARTHROSCOPY W/ MENISCAL REPAIR     LUNG REMOVAL, PARTIAL Right 03/27/2022   TOTAL KNEE ARTHROPLASTY Left 04/29/2023   Patient Active Problem List   Diagnosis Date Noted   Colon polyps 05/16/2019   Obesity (BMI 30.0-34.9) 05/13/2019   Chest tightness 10/06/2014   COVID 10/06/2014   Hematochezia 12/07/2012   Hernia of abdominal wall 12/07/2012   Well adult exam 04/23/2012   Neoplasm of uncertain behavior of skin 05/04/2010   ACTINIC KERATOSIS 05/04/2010   Dyslipidemia 04/04/2008   GERD 04/04/2008   Osteoarthritis 04/04/2008   KNEE PAIN 04/04/2008   ABNORMAL GLUCOSE NEC 04/04/2008   ABNORMAL LIVER FUNCTION TESTS 04/04/2008   Essential hypertension 12/29/2006    PCP: Garald Karlynn GAILS, MD   REFERRING PROVIDER: Sedalia Carrier, PA  REFERRING DIAG: Left TKA 04/29/2023   THERAPY DIAG:  Acute pain of left knee  Localized edema  Other abnormalities of gait and mobility  Muscle weakness (generalized)  Rationale for Evaluation and Treatment: Rehabilitation  ONSET DATE: surgery 04/29/23  SUBJECTIVE:   SUBJECTIVE STATEMENT: I think I'm about ready to get rid of the cane. I use it mainly  for going up and down steps at home. I tripped on the stairs and really bent the knee which hurt at the time, but it may have helped.  PERTINENT HISTORY: Lt TKA 04/29/23, Lung cancer with lung resection 03/2022 PAIN: 05/21/23:  Are you having pain? Yes: NPRS scale: 4/10 Pain location: Lt knee  Pain description: throbbing  Aggravating factors: walking, standing  Relieving factors: ice, pain medication, rest   PRECAUTIONS: Other: history of lung cancer   RED FLAGS: None   WEIGHT BEARING RESTRICTIONS: No  FALLS:  Has patient fallen in last 6 months? 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  PATIENT SURVEYS:  05/21/23: FOTO 44 (goal is 75)  COGNITION: Overall cognitive status: Within functional limits for tasks assessed     SENSATION: WFL  EDEMA:  Circumferential: Lt midpatellar 16.5 inches, Rt 15 inches    POSTURE: No Significant postural limitations  PALPATION: Healing surgical incision with  steri strip like covering over distal aspect.  One area over proximal aspect that looks like it might open.  PT educated pt and his wife when to seek medical attention.  LOWER EXTREMITY ROM:  Active ROM A/P Right eval Left eval Left 05/26/23  Hip flexion Full ROM Full hip ROM   Hip extension     Hip abduction     Hip adduction     Hip internal rotation     Hip external rotation     Knee flexion  65 73 passive  Knee extension  10 -3/0  Ankle  dorsiflexion     Ankle plantarflexion     Ankle inversion     Ankle eversion      (Blank rows = not tested)  LOWER EXTREMITY MMT:  MMT Right eval Left eval  Hip flexion 5/5 throughout  4-  Hip extension  4-  Hip abduction    Hip adduction    Hip internal rotation    Hip external rotation    Knee flexion  4  Knee extension  4-  Ankle dorsiflexion  5  Ankle plantarflexion    Ankle inversion    Ankle eversion     (Blank rows = not tested)   GAIT: Distance walked: 50 Assistive device utilized: Single point cane Level of assistance: Complete Independence Comments: reduced Lt knee flexion with swing through.  Pelvic compensation with ascending and descending steps due to limited Lt knee flexion                                                                                                                   TREATMENT DATE:  05/26/23 Nustep L5 x 6 min Standing knee flexion stretch on 2nd step Standing HS stretch with foot on 1st step and overpressure above knee 2x 20 sec Standing TKE into ball 5 sec hold x 20 Pre-gait weight shifting fwd/bwd on left LE Gait one lap around both gyms  Standing hip ABD, ext  x 20 ea B Red band at ankles Mini squat x 20 Standing HS curl with strap assist x 10 Prone knee flexion with strap x 10, then with prolonged hold x 30 sec  Prone knee ext hang x 2 min Manual: patellar mobs, Knee flexion mobs, contract relax for flexion and supine passive flexion  HEP update  05/23/23 Nustep L5 x 6 min Standing knee flexion stretch on 2nd step Standing HS stretch with foot on 1st step and overpressure above knee 2x 20 sec Standing TKE into ball 5 sec hold x 20 Standing hip ABD, ext  x 20 ea B Mini squat x 20 Rockerboard 10 sec hold in DF then release x 20 Seated knee flexion stretch Manual: patellar mobs, seated passive flexion with contralateral knee ext Supine heel slide with left leg off mat foot on slider and strap assist x 20 SLR x 20 HEP  update   05/21/23:  HEP established- see below   NuStep: level 5x 8 minutes- hold in flexion  Game  Ready: 3 snowflakes, med compression x10 min    PATIENT EDUCATION:  Education details: Access Code: [email or web address] Person educated: Patient Education method: Explanation, Demonstration, and Handouts Education comprehension: verbalized understanding and returned demonstration  HOME EXERCISE PROGRAM: Access Code: [email or web address] URL: [email or web address] Date: 05/26/2023 Prepared by: Mliss  Exercises - Seated Knee Flexion AAROM  - 3-5 x daily - 7 x weekly - 1 sets - 10 reps - 5-10 hold - Standing Knee Flexion Stretch on Step  - 3-5 x daily - 7 x weekly - 1 sets - 10 reps - 5-10 hold - Supine Quad Set  - 2 x daily - 7 x weekly - 1 sets - 10 reps - 5 hold - Supine Heel Slide with Strap  - 2 x daily - 7 x weekly - 1-2 sets - 10 reps - Standing Hip Abduction with Resistance at Ankles and Counter Support  - 1 x daily - 3-4 x weekly - 3 sets - 10 reps - Standing Hip Extension with Resistance at Ankles and Counter Support  - 1 x daily - 3-4 x weekly - 3 sets - 10 reps - Prone Knee Extension with Ankle Weight  - 2 x daily - 7 x weekly - 1 sets - 1 reps - up to 5 min hold - Prone Quadriceps Stretch with Strap (Mirrored)  - 2-3 x daily - 7 x weekly - 1 sets - 3 reps - 60 sec hold  ASSESSMENT:  CLINICAL IMPRESSION: Patient demonstrates improvement in L knee passive flexion to 73. We focused on stretches to increase this. Extension is progressing well. Gait much improved with cueing and pre-gait activities for increased stance time left.   OBJECTIVE IMPAIRMENTS: decreased activity tolerance, decreased coordination, decreased endurance, decreased mobility, difficulty walking, decreased ROM, decreased strength, hypomobility, increased edema, impaired flexibility, and pain.   ACTIVITY LIMITATIONS: sitting, standing, squatting, stairs, transfers, bathing, dressing, hygiene/grooming, and locomotion  level  PARTICIPATION LIMITATIONS: cleaning, driving, community activity, occupation, and yard work  PERSONAL FACTORS: 1-2 comorbidities: Lt TKA, lung cancer   are also affecting patient's functional outcome.   REHAB POTENTIAL: Good  CLINICAL DECISION MAKING: Stable/uncomplicated  EVALUATION COMPLEXITY: Low   GOALS: Goals reviewed with patient? Yes  SHORT TERM GOALS: Target date: 06/18/2023   Be independent in initial HEP Baseline: Goal status: INITIAL  2.  Demonstrate Lt knee A/ROM flexion to > or = to 95 degrees to allow for sitting and negotiating steps without compensation  Baseline: 65 Goal status: INITIAL  3.  Improve FOTO to > or = to 50  Baseline: 44 Goal status: INITIAL  4.   Improve strength to ascend steps with step-over step gait with use of rail to facilitate return to work Baseline:  Goal status: INITIAL  5.  Stand and walk > or = to 1 hour without limitation due to pain or fatigue  Baseline:  Goal status: INITIAL   LONG TERM GOALS: Target date: 07/16/2023    Be independent in advanced HEP Baseline:  Goal status: INITIAL  2.  Improve FOTO to > or = to 75 Baseline: 44 Goal status: INITIAL  3.  Demonstrate Lt knee A/ROM flexion to > or = to 110 degrees to allow steps and squatting without compensation Baseline: 65 Goal status: INITIAL  4.  Demonstrate symmetry with ambulation on level surface due to increased strength and ROM Baseline:  Goal status: INITIAL  5.  Improve strength to negotiate steps with step-over step gait with use of rail to facilitate return to  work Baseline:  Goal status: INITIAL  6.  Stand and walk > or = to 1.5 hours without limitation due to pain or fatigue  Baseline:  Goal status: INITIAL   PLAN:  PT FREQUENCY: 2x/week  PT DURATION: 8 weeks  PLANNED INTERVENTIONS: 97110-Therapeutic exercises, 97530- Therapeutic activity, 97112- Neuromuscular re-education, 97535- Self Care, 02859- Manual therapy, [id number]- Gait  training, [phone]- Aquatic Therapy, 97014- Electrical stimulation (unattended), [id number]- Electrical stimulation (manual), 97016- Vasopneumatic device, 97035- Ultrasound, Patient/Family education, Balance training, Taping, Joint mobilization, Joint manipulation, Scar mobilization, Vestibular training, and Cryotherapy  PLAN FOR NEXT SESSION: Lt knee ROM, strength, edema management and gait, manual for ROM gains, game ready   Mliss Cummins, PT  05/26/23 10:16 AM   [facility] [address], [address] [place], [place] [address] Phone # [phone] Fax [phone]

## 2023-05-26 ENCOUNTER — Encounter: Payer: Self-pay | Admitting: Physical Therapy

## 2023-05-26 ENCOUNTER — Ambulatory Visit: Payer: No Typology Code available for payment source | Admitting: Physical Therapy

## 2023-05-26 DIAGNOSIS — M25562 Pain in left knee: Secondary | ICD-10-CM

## 2023-05-26 DIAGNOSIS — M6281 Muscle weakness (generalized): Secondary | ICD-10-CM

## 2023-05-26 DIAGNOSIS — R2689 Other abnormalities of gait and mobility: Secondary | ICD-10-CM

## 2023-05-26 DIAGNOSIS — Z471 Aftercare following joint replacement surgery: Secondary | ICD-10-CM | POA: Diagnosis not present

## 2023-05-26 DIAGNOSIS — R600 Localized edema: Secondary | ICD-10-CM

## 2023-05-28 ENCOUNTER — Ambulatory Visit: Payer: No Typology Code available for payment source

## 2023-05-28 DIAGNOSIS — Z471 Aftercare following joint replacement surgery: Secondary | ICD-10-CM | POA: Diagnosis not present

## 2023-05-28 DIAGNOSIS — M6281 Muscle weakness (generalized): Secondary | ICD-10-CM

## 2023-05-28 DIAGNOSIS — R2689 Other abnormalities of gait and mobility: Secondary | ICD-10-CM

## 2023-05-28 DIAGNOSIS — R600 Localized edema: Secondary | ICD-10-CM

## 2023-05-28 DIAGNOSIS — M25562 Pain in left knee: Secondary | ICD-10-CM

## 2023-05-28 NOTE — Therapy (Signed)
 OUTPATIENT PHYSICAL THERAPY LOWER EXTREMITY TREATMENT   Patient Name: Jesse Stewart MRN: [id number] DOB:[birth year], 58 y.o., male Today's Date: 05/28/2023  END OF SESSION:  PT End of Session - 05/28/23 1620     Visit Number 4    Date for PT Re-Evaluation 07/16/23    Authorization Type [employer]-15 visits approved 12/20-4/19/25    Authorization - Visit Number 4    Authorization - Number of Visits 15    PT Start Time 1534    PT Stop Time 1617    PT Time Calculation (min) 43 min    Activity Tolerance Patient tolerated treatment well    Behavior During Therapy [facility] for tasks assessed/performed                Past Medical History:  Diagnosis Date   Arthritis    Hypertension    Lung cancer (HCC) 03/13/2022   Past Surgical History:  Procedure Laterality Date   KNEE ARTHROSCOPY W/ MENISCAL REPAIR     LUNG REMOVAL, PARTIAL Right 03/27/2022   TOTAL KNEE ARTHROPLASTY Left 04/29/2023   Patient Active Problem List   Diagnosis Date Noted   Colon polyps 05/16/2019   Obesity (BMI 30.0-34.9) 05/13/2019   Chest tightness 10/06/2014   COVID 10/06/2014   Hematochezia 12/07/2012   Hernia of abdominal wall 12/07/2012   Well adult exam 04/23/2012   Neoplasm of uncertain behavior of skin 05/04/2010   ACTINIC KERATOSIS 05/04/2010   Dyslipidemia 04/04/2008   GERD 04/04/2008   Osteoarthritis 04/04/2008   KNEE PAIN 04/04/2008   ABNORMAL GLUCOSE NEC 04/04/2008   ABNORMAL LIVER FUNCTION TESTS 04/04/2008   Essential hypertension 12/29/2006    PCP: Genia Kettering, MD   REFERRING PROVIDER: Jannett Mems, PA  REFERRING DIAG: Left TKA 04/29/2023   THERAPY DIAG:  Acute pain of left knee  Localized edema  Other abnormalities of gait and mobility  Muscle weakness (generalized)  Rationale for Evaluation and Treatment: Rehabilitation  ONSET DATE: surgery 04/29/23  SUBJECTIVE:   SUBJECTIVE STATEMENT: I haven't been using the cane.  I am working on bending it.     PERTINENT HISTORY: Lt TKA 04/29/23, Lung cancer with lung resection 03/2022   PAIN: 05/28/23:  Are you having pain? Yes: NPRS scale: 5/10 Pain location: Lt knee  Pain description: throbbing  Aggravating factors: walking, standing  Relieving factors: ice, pain medication, rest   PRECAUTIONS: Other: history of lung cancer   RED FLAGS: None   WEIGHT BEARING RESTRICTIONS: No  FALLS:  Has patient fallen in last 6 months? 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  PATIENT SURVEYS:  05/21/23: FOTO 44 (goal is 75)  COGNITION: Overall cognitive status: Within functional limits for tasks assessed     SENSATION: WFL  EDEMA:  Circumferential: Lt midpatellar 16.5 inches, Rt 15 inches    POSTURE: No Significant postural limitations  PALPATION: Healing surgical incision with steri strip like covering over distal aspect.  One area over proximal aspect that looks like it might open.  PT educated pt and his  wife when to seek medical attention.  LOWER EXTREMITY ROM:  Active ROM A/P Right eval Left eval Left 05/26/23  Hip flexion Full ROM Full hip ROM   Hip extension     Hip abduction     Hip adduction     Hip internal rotation     Hip external rotation     Knee flexion  65 73 passive  Knee extension  10 -3/0  Ankle dorsiflexion     Ankle plantarflexion     Ankle inversion     Ankle eversion      (Blank rows = not tested)  LOWER EXTREMITY  MMT:  MMT Right eval Left eval  Hip flexion 5/5 throughout  4-  Hip extension  4-  Hip abduction    Hip adduction    Hip internal rotation    Hip external rotation    Knee flexion  4  Knee extension  4-  Ankle dorsiflexion  5  Ankle plantarflexion    Ankle inversion    Ankle eversion     (Blank rows = not tested)   GAIT: Distance walked: 50 Assistive device utilized: Single point cane Level of assistance: Complete Independence Comments: reduced Lt knee flexion with swing through.  Pelvic compensation with ascending and descending steps due to limited Lt knee flexion                                                                                                                   TREATMENT DATE:  05/28/23 Nustep L5 x 7 min- for ROM Standing knee flexion stretch on 2nd step Standing HS stretch with foot on 1st step and overpressure above knee 2x 20 sec Seated hamstring stretch 3x20 seconds  Lt Weight shifting on balance pad: 3 ways x 10 min each Standing rockerboard x3 minutes  6" step up on Rt 2x10 Mini squat x 20 Seated Lt knee flexion with slider- overpressure with Rt LE Manual: anterior tibial mobs and P/ROM with Mulligan strap, P/ROM into flexion   05/26/23 Nustep L5 x 6 min Standing knee flexion stretch on 2nd step Standing HS stretch with foot on 1st step and overpressure above knee 2x 20 sec Standing TKE into ball 5 sec hold x 20 Pre-gait weight shifting fwd/bwd on left LE Gait one lap around both gyms  Standing hip ABD, ext  x 20 ea B Red band at ankles Mini squat x 20 Standing HS curl with strap assist x 10 Prone knee flexion with strap x 10, then with prolonged hold x 30 sec  Prone knee ext hang x 2 min Manual: patellar mobs, Knee flexion mobs, contract relax for flexion and supine passive flexion  HEP update  05/23/23 Nustep L5 x 6 min Standing knee flexion stretch on 2nd step Standing HS stretch with foot on 1st step and overpressure above knee 2x 20  sec Standing TKE into ball 5 sec hold x 20 Standing hip ABD, ext  x 20 ea B Mini squat x 20 Rockerboard 10 sec  hold in DF then release x 20 Seated knee flexion stretch Manual: patellar mobs, seated passive flexion with contralateral knee ext Supine heel slide with left leg off mat foot on slider and strap assist x 20 SLR x 20 HEP update  PATIENT EDUCATION:  Education details: Access Code: [email or web address] Person educated: Patient Education method: Explanation, Demonstration, and Handouts Education comprehension: verbalized understanding and returned demonstration  HOME EXERCISE PROGRAM: Access Code: [email or web address] URL: [email or web address] Date: 05/26/2023 Prepared by: Concha Deed  Exercises - Seated Knee Flexion AAROM  - 3-5 x daily - 7 x weekly - 1 sets - 10 reps - 5-10 hold - Standing Knee Flexion Stretch on Step  - 3-5 x daily - 7 x weekly - 1 sets - 10 reps - 5-10 hold - Supine Quad Set  - 2 x daily - 7 x weekly - 1 sets - 10 reps - 5 hold - Supine Heel Slide with Strap  - 2 x daily - 7 x weekly - 1-2 sets - 10 reps - Standing Hip Abduction with Resistance at Ankles and Counter Support  - 1 x daily - 3-4 x weekly - 3 sets - 10 reps - Standing Hip Extension with Resistance at Ankles and Counter Support  - 1 x daily - 3-4 x weekly - 3 sets - 10 reps - Prone Knee Extension with Ankle Weight  - 2 x daily - 7 x weekly - 1 sets - 1 reps - up to 5 min hold - Prone Quadriceps Stretch with Strap (Mirrored)  - 2-3 x daily - 7 x weekly - 1 sets - 3 reps - 60 sec hold  ASSESSMENT:  CLINICAL IMPRESSION: Pt continues to demonstrate limited Lt knee flexion.  Pt educated pt regarding knee flexion during swing through phase of gait as he is not doing this when he walks.  He is not using cane today and PT educated pt to use the cane to normalize pattern.  Extension is progressing well. Gait much improved with cueing and pre-gait activities for increased stance time left. Pt tolerated all exercise  without increased pain and pt is managing edema at home.  Patient will benefit from skilled PT to address the below impairments and improve overall function.   OBJECTIVE IMPAIRMENTS: decreased activity tolerance, decreased coordination, decreased endurance, decreased mobility, difficulty walking, decreased ROM, decreased strength, hypomobility, increased edema, impaired flexibility, and pain.   ACTIVITY LIMITATIONS: sitting, standing, squatting, stairs, transfers, bathing, dressing, hygiene/grooming, and locomotion level  PARTICIPATION LIMITATIONS: cleaning, driving, community activity, occupation, and yard work  PERSONAL FACTORS: 1-2 comorbidities: Lt TKA, lung cancer   are also affecting patient's functional outcome.   REHAB POTENTIAL: Good  CLINICAL DECISION MAKING: Stable/uncomplicated  EVALUATION COMPLEXITY: Low   GOALS: Goals reviewed with patient? Yes  SHORT TERM GOALS: Target date: 06/18/2023   Be independent in initial HEP Baseline: Goal status: INITIAL  2.  Demonstrate Lt knee A/ROM flexion to > or = to 95 degrees to allow for sitting and negotiating steps without compensation  Baseline: 65 Goal status: INITIAL  3.  Improve FOTO to > or = to 50  Baseline: 44 Goal status: INITIAL  4.   Improve strength to ascend steps with step-over step gait with use of rail to facilitate return to work Baseline:  Goal status: INITIAL  5.  Stand and walk > or = to 1 hour without limitation due to pain or fatigue  Baseline:  Goal status: INITIAL   LONG TERM GOALS: Target date: 07/16/2023  Be independent in advanced HEP Baseline:  Goal status: INITIAL  2.  Improve FOTO to > or = to 75 Baseline: 44 Goal status: INITIAL  3.  Demonstrate Lt knee A/ROM flexion to > or = to 110 degrees to allow steps and squatting without compensation Baseline: 65 Goal status: INITIAL  4.  Demonstrate symmetry with ambulation on level surface due to increased strength and ROM Baseline:   Goal status: INITIAL  5.  Improve strength to negotiate steps with step-over step gait with use of rail to facilitate return to work Baseline:  Goal status: INITIAL  6.  Stand and walk > or = to 1.5 hours without limitation due to pain or fatigue  Baseline:  Goal status: INITIAL   PLAN:  PT FREQUENCY: 2x/week  PT DURATION: 8 weeks  PLANNED INTERVENTIONS: 97110-Therapeutic exercises, 97530- Therapeutic activity, 97112- Neuromuscular re-education, 97535- Self Care, 78295- Manual therapy, [phone]- Gait training, [phone]- Aquatic Therapy, 97014- Electrical stimulation (unattended), [id number]- Electrical stimulation (manual), 97016- Vasopneumatic device, 97035- Ultrasound, Patient/Family education, Balance training, Taping, Joint mobilization, Joint manipulation, Scar mobilization, Vestibular training, and Cryotherapy  PLAN FOR NEXT SESSION: Lt knee ROM, strength, edema management and gait, manual for ROM gains, resume game ready as needed   Luella Sager, PT 05/28/23 4:26 PM   [facility] [address], [address] [place], [place] [address] Phone # [phone] Fax [phone]

## 2023-05-29 NOTE — Therapy (Signed)
OUTPATIENT PHYSICAL THERAPY LOWER EXTREMITY TREATMENT   Patient Name: Jesse Stewart MRN: [id number] DOB:1966-05-04, 58 y.o., male Today's Date: 05/30/2023  END OF SESSION:  PT End of Session - 05/30/23 0933     Visit Number 5    Date for PT Re-Evaluation 07/16/23    Authorization Type [facility] visits approved 12/20-4/19/25    Authorization - Visit Number 5    Authorization - Number of Visits 15    PT Start Time 0930    PT Stop Time 1014    PT Time Calculation (min) 44 min    Activity Tolerance Patient tolerated treatment well    Behavior During Therapy [facility] for tasks assessed/performed                 Past Medical History:  Diagnosis Date   Arthritis    Hypertension    Lung cancer (HCC) 03/13/2022   Past Surgical History:  Procedure Laterality Date   KNEE ARTHROSCOPY W/ MENISCAL REPAIR     LUNG REMOVAL, PARTIAL Right 03/27/2022   TOTAL KNEE ARTHROPLASTY Left 04/29/2023   Patient Active Problem List   Diagnosis Date Noted   Colon polyps 05/16/2019   Obesity (BMI 30.0-34.9) 05/13/2019   Chest tightness 10/06/2014   COVID 10/06/2014   Hematochezia 12/07/2012   Hernia of abdominal wall 12/07/2012   Well adult exam 04/23/2012   Neoplasm of uncertain behavior of skin 05/04/2010   ACTINIC KERATOSIS 05/04/2010   Dyslipidemia 04/04/2008   GERD 04/04/2008   Osteoarthritis 04/04/2008   KNEE PAIN 04/04/2008   ABNORMAL GLUCOSE NEC 04/04/2008   ABNORMAL LIVER FUNCTION TESTS 04/04/2008   Essential hypertension 12/29/2006    PCP: Tresa Garter, MD   REFERRING PROVIDER: Patrina Levering, PA  REFERRING DIAG: Left TKA 04/29/2023   THERAPY DIAG:  Acute pain of left knee  Localized edema  Other abnormalities of gait and mobility  Muscle weakness (generalized)  Rationale for Evaluation and Treatment: Rehabilitation  ONSET DATE: surgery 04/29/23  SUBJECTIVE:   SUBJECTIVE STATEMENT: I think it's worse at night.   PERTINENT HISTORY: Lt TKA  04/29/23, Lung cancer with lung resection 03/2022   PAIN: 05/28/23:  Are you having pain? Yes: NPRS scale: 5/10 Pain location: Lt knee  Pain description: throbbing  Aggravating factors: walking, standing  Relieving factors: ice, pain medication, rest   PRECAUTIONS: Other: history of lung cancer   RED FLAGS: None   WEIGHT BEARING RESTRICTIONS: No  FALLS:  Has patient fallen in last 6 months? 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  PATIENT SURVEYS:  05/21/23: FOTO 44 (goal is 75)  COGNITION: Overall cognitive status: Within functional limits for tasks assessed     SENSATION: WFL  EDEMA:  Circumferential: Lt midpatellar 16.5 inches, Rt 15 inches    POSTURE: No Significant postural limitations  PALPATION: Healing surgical incision with steri strip like covering over distal aspect.  One area over proximal aspect that looks like it might open.  PT educated pt and his wife when to seek medical attention.  LOWER EXTREMITY ROM:  Active ROM A/P Right eval Left eval Left 05/26/23  Hip flexion Full ROM Full hip ROM   Hip extension     Hip abduction     Hip adduction     Hip internal rotation     Hip external rotation     Knee flexion  65 73 passive  Knee extension  10 -3/0  Ankle dorsiflexion     Ankle plantarflexion     Ankle inversion     Ankle eversion      (Blank rows = not tested)  LOWER EXTREMITY MMT:  MMT Right eval  Left eval  Hip flexion 5/5 throughout  4-  Hip extension  4-  Hip abduction    Hip adduction    Hip internal rotation    Hip external rotation    Knee flexion  4  Knee extension  4-  Ankle dorsiflexion  5  Ankle plantarflexion    Ankle inversion    Ankle eversion     (Blank rows = not tested)   GAIT: Distance walked: 50 Assistive device utilized: Single point cane Level of assistance: Complete Independence Comments: reduced Lt knee flexion with swing through.  Pelvic compensation with ascending and descending steps due to limited Lt knee flexion                                                                                                                   TREATMENT DATE:  05/30/23 Therex: Nustep L5 x 7 min- for ROM Standing knee flexion stretch on 14 in step  Gait pregait activities in standing working on heel/toe gait and knee flexion during swing Gait in front of mirror for visual feedback   Manual:  IASTM and STM to distal quads, around incision Patellar mobs focusing on superior glide Hooklying anterior mobs with AA flexion Seated anterior tibial mobs and P/ROM with Mulligan strap, P/ROM into flexion    05/28/23 Nustep L5 x 7 min- for ROM Standing knee flexion stretch on 2nd step Standing HS stretch with foot on 1st step and overpressure above knee 2x 20 sec Seated hamstring stretch 3x20 seconds  Lt Weight shifting on balance pad: 3 ways x 10 min each Standing rockerboard x3 minutes  6" step up on Rt 2x10 Mini squat x 20 Seated Lt knee flexion with slider- overpressure with Rt LE Manual: anterior tibial mobs and P/ROM with Mulligan strap, P/ROM into flexion   05/26/23 Nustep L5 x 6 min Standing knee flexion stretch on 2nd step Standing HS stretch with foot on 1st step and overpressure above knee 2x 20 sec Standing TKE into ball 5 sec hold x 20 Pre-gait weight shifting fwd/bwd on left LE Gait one lap around both gyms  Standing hip ABD, ext  x 20 ea B Red  band at ankles Mini squat x 20 Standing HS curl with strap assist x 10 Prone knee flexion with strap x 10, then with prolonged hold x 30 sec  Prone knee ext hang x 2  min Manual: patellar mobs, Knee flexion mobs, contract relax for flexion and supine passive flexion  HEP update  05/23/23 Nustep L5 x 6 min Standing knee flexion stretch on 2nd step Standing HS stretch with foot on 1st step and overpressure above knee 2x 20 sec Standing TKE into ball 5 sec hold x 20 Standing hip ABD, ext  x 20 ea B Mini squat x 20 Rockerboard 10 sec hold in DF then release x 20 Seated knee flexion stretch Manual: patellar mobs, seated passive flexion with contralateral knee ext Supine heel slide with left leg off mat foot on slider and strap assist x 20 SLR x 20 HEP update  PATIENT EDUCATION:  Education details: Access Code: [email or web address] Person educated: Patient Education method: Explanation, Demonstration, and Handouts Education comprehension: verbalized understanding and returned demonstration  HOME EXERCISE PROGRAM: Access Code: [email or web address] URL: [email or web address] Date: 05/26/2023 Prepared by: Raynelle Fanning  Exercises - Seated Knee Flexion AAROM  - 3-5 x daily - 7 x weekly - 1 sets - 10 reps - 5-10 hold - Standing Knee Flexion Stretch on Step  - 3-5 x daily - 7 x weekly - 1 sets - 10 reps - 5-10 hold - Supine Quad Set  - 2 x daily - 7 x weekly - 1 sets - 10 reps - 5 hold - Supine Heel Slide with Strap  - 2 x daily - 7 x weekly - 1-2 sets - 10 reps - Standing Hip Abduction with Resistance at Ankles and Counter Support  - 1 x daily - 3-4 x weekly - 3 sets - 10 reps - Standing Hip Extension with Resistance at Ankles and Counter Support  - 1 x daily - 3-4 x weekly - 3 sets - 10 reps - Prone Knee Extension with Ankle Weight  - 2 x daily - 7 x weekly - 1 sets - 1 reps - up to 5 min hold - Prone Quadriceps Stretch with Strap (Mirrored)  - 2-3 x daily - 7 x weekly - 1 sets - 3 reps - 60 sec  hold  ASSESSMENT:  CLINICAL IMPRESSION: Focused on manual today to decrease tightness and increase patellar mobility and knee flexion primarily. Patent reports pain in medial knee with gait. He tends to over pronate on the R which may be contributing, but he also experienced less pain after improving his gait pattern using the mirror for cues. Advised patient to start slower when walking and ensure gait pattern is correct before speeding up. Knee flexion is still limited to 72 deg and that was at the end of the session.   OBJECTIVE IMPAIRMENTS: decreased activity tolerance, decreased coordination, decreased endurance, decreased mobility, difficulty walking, decreased ROM, decreased strength, hypomobility, increased edema, impaired flexibility, and pain.   ACTIVITY LIMITATIONS: sitting, standing, squatting, stairs, transfers, bathing, dressing, hygiene/grooming, and locomotion level  PARTICIPATION LIMITATIONS: cleaning, driving, community activity, occupation, and yard work  PERSONAL FACTORS: 1-2 comorbidities: Lt TKA, lung cancer   are also affecting patient's functional outcome.   REHAB POTENTIAL: Good  CLINICAL DECISION MAKING: Stable/uncomplicated  EVALUATION COMPLEXITY: Low   GOALS: Goals reviewed with patient? Yes  SHORT TERM GOALS: Target date: 06/18/2023   Be independent in initial HEP Baseline: Goal status: INITIAL  2.  Demonstrate Lt knee A/ROM flexion to > or = to 95 degrees to allow for sitting and negotiating steps without compensation  Baseline: 65 Goal status: INITIAL  3.  Improve FOTO to > or = to 50  Baseline: 44 Goal status: INITIAL  4.  Improve strength to ascend steps with step-over step gait with use of rail to facilitate return to work Baseline:  Goal status: INITIAL  5.  Stand and walk > or = to 1 hour without limitation due to pain or fatigue  Baseline:  Goal status: INITIAL   LONG TERM GOALS: Target date: 07/16/2023    Be independent in advanced  HEP Baseline:  Goal status: INITIAL  2.  Improve FOTO to > or = to 75 Baseline: 44 Goal status: INITIAL  3.  Demonstrate Lt knee A/ROM flexion to > or = to 110 degrees to allow steps and squatting without compensation Baseline: 65 Goal status: INITIAL  4.  Demonstrate symmetry with ambulation on level surface due to increased strength and ROM Baseline:  Goal status: INITIAL  5.  Improve strength to negotiate steps with step-over step gait with use of rail to facilitate return to work Baseline:  Goal status: INITIAL  6.  Stand and walk > or = to 1.5 hours without limitation due to pain or fatigue  Baseline:  Goal status: INITIAL   PLAN:  PT FREQUENCY: 2x/week  PT DURATION: 8 weeks  PLANNED INTERVENTIONS: 97110-Therapeutic exercises, 97530- Therapeutic activity, 97112- Neuromuscular re-education, 97535- Self Care, 13086- Manual therapy, [phone]- Gait training, [phone]- Aquatic Therapy, 97014- Electrical stimulation (unattended), [id number]- Electrical stimulation (manual), 97016- Vasopneumatic device, 97035- Ultrasound, Patient/Family education, Balance training, Taping, Joint mobilization, Joint manipulation, Scar mobilization, Vestibular training, and Cryotherapy  PLAN FOR NEXT SESSION: Lt knee ROM, strength, edema management and gait, manual for ROM gains, resume game ready as needed   Solon Palm, PT 05/30/23 11:39 AM   [facility] [address], [address] [place], [place] [address] Phone # [phone] Fax [phone]

## 2023-05-30 ENCOUNTER — Ambulatory Visit: Payer: No Typology Code available for payment source | Admitting: Physical Therapy

## 2023-05-30 ENCOUNTER — Encounter: Payer: Self-pay | Admitting: Physical Therapy

## 2023-05-30 DIAGNOSIS — R600 Localized edema: Secondary | ICD-10-CM

## 2023-05-30 DIAGNOSIS — M6281 Muscle weakness (generalized): Secondary | ICD-10-CM

## 2023-05-30 DIAGNOSIS — Z471 Aftercare following joint replacement surgery: Secondary | ICD-10-CM | POA: Diagnosis not present

## 2023-05-30 DIAGNOSIS — R2689 Other abnormalities of gait and mobility: Secondary | ICD-10-CM

## 2023-05-30 DIAGNOSIS — M25562 Pain in left knee: Secondary | ICD-10-CM

## 2023-06-02 ENCOUNTER — Ambulatory Visit: Payer: No Typology Code available for payment source

## 2023-06-02 DIAGNOSIS — R2689 Other abnormalities of gait and mobility: Secondary | ICD-10-CM

## 2023-06-02 DIAGNOSIS — M25562 Pain in left knee: Secondary | ICD-10-CM

## 2023-06-02 DIAGNOSIS — M6281 Muscle weakness (generalized): Secondary | ICD-10-CM

## 2023-06-02 DIAGNOSIS — R600 Localized edema: Secondary | ICD-10-CM

## 2023-06-02 DIAGNOSIS — Z471 Aftercare following joint replacement surgery: Secondary | ICD-10-CM | POA: Diagnosis not present

## 2023-06-02 NOTE — Therapy (Signed)
OUTPATIENT PHYSICAL THERAPY LOWER EXTREMITY TREATMENT   Patient Name: Jesse Stewart MRN: [id number] DOB:May 13, 1966, 58 y.o., male Today's Date: 06/02/2023  END OF SESSION:  PT End of Session - 06/02/23 1100     Visit Number 6    Date for PT Re-Evaluation 07/16/23    Authorization Type [employer]-15 visits approved 12/20-4/19/25    Authorization - Visit Number 6    Authorization - Number of Visits 15    PT Start Time 1016    PT Stop Time 1059    PT Time Calculation (min) 43 min    Activity Tolerance Patient tolerated treatment well    Behavior During Therapy [facility] for tasks assessed/performed                  Past Medical History:  Diagnosis Date   Arthritis    Hypertension    Lung cancer (HCC) 03/13/2022   Past Surgical History:  Procedure Laterality Date   KNEE ARTHROSCOPY W/ MENISCAL REPAIR     LUNG REMOVAL, PARTIAL Right 03/27/2022   TOTAL KNEE ARTHROPLASTY Left 04/29/2023   Patient Active Problem List   Diagnosis Date Noted   Colon polyps 05/16/2019   Obesity (BMI 30.0-34.9) 05/13/2019   Chest tightness 10/06/2014   COVID 10/06/2014   Hematochezia 12/07/2012   Hernia of abdominal wall 12/07/2012   Well adult exam 04/23/2012   Neoplasm of uncertain behavior of skin 05/04/2010   ACTINIC KERATOSIS 05/04/2010   Dyslipidemia 04/04/2008   GERD 04/04/2008   Osteoarthritis 04/04/2008   KNEE PAIN 04/04/2008   ABNORMAL GLUCOSE NEC 04/04/2008   ABNORMAL LIVER FUNCTION TESTS 04/04/2008   Essential hypertension 12/29/2006    PCP: Tresa Garter, MD   REFERRING PROVIDER: Patrina Levering, PA  REFERRING DIAG: Left TKA 04/29/2023   THERAPY DIAG:  Acute pain of left knee  Localized edema  Other abnormalities of gait and mobility  Muscle weakness (generalized)  Rationale for Evaluation and Treatment: Rehabilitation  ONSET DATE: surgery 04/29/23  SUBJECTIVE:   SUBJECTIVE STATEMENT: My knee feels stiff in the morning.   PERTINENT  HISTORY: Lt TKA 04/29/23, Lung cancer with lung resection 03/2022   PAIN: 06/02/23:  Are you having pain? Yes: NPRS scale: 4/10 Pain location: Lt knee  Pain description: throbbing  Aggravating factors: walking, standing  Relieving factors: ice, pain medication, rest   PRECAUTIONS: Other: history of lung cancer   RED FLAGS: None   WEIGHT BEARING RESTRICTIONS: No  FALLS:  Has patient fallen in last 6 months? 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  PATIENT SURVEYS:  05/21/23: FOTO 44 (goal is 75)  COGNITION: Overall cognitive status: Within functional limits for tasks assessed     SENSATION: WFL  EDEMA:  Circumferential: Lt midpatellar 16.5 inches, Rt 15 inches    POSTURE: No Significant postural limitations  PALPATION: Healing surgical incision with steri strip like covering over distal aspect.  One area over proximal aspect that looks like it might open.  PT educated pt and his wife when to seek medical  attention.  LOWER EXTREMITY ROM:  Active ROM A/P Right eval Left eval Left 05/26/23  Hip flexion Full ROM Full hip ROM   Hip extension     Hip abduction     Hip adduction     Hip internal rotation     Hip external rotation     Knee flexion  65 73 passive  Knee extension  10 -3/0  Ankle dorsiflexion     Ankle plantarflexion     Ankle inversion     Ankle eversion      (Blank rows = not tested)  LOWER EXTREMITY MMT:  MMT  Right eval Left eval  Hip flexion 5/5 throughout  4-  Hip extension  4-  Hip abduction    Hip adduction    Hip internal rotation    Hip external rotation    Knee flexion  4  Knee extension  4-  Ankle dorsiflexion  5  Ankle plantarflexion    Ankle inversion    Ankle eversion     (Blank rows = not tested)   GAIT: Distance walked: 50 Assistive device utilized: Single point cane Level of assistance: Complete Independence Comments: reduced Lt knee flexion with swing through.  Pelvic compensation with ascending and descending steps due to limited Lt knee flexion                                                                                                                   TREATMENT DATE:  06/02/23 Therex: Bike: Level 0 x 7 min- for ROM Slider into flexion in sitting x20 Standing knee flexion stretch on 14 in step Leg press: 90# bil seat 6 2x10, Lt only 50# 2x10  Gait pregait activities in standing working on heel/toe gait and knee flexion during swing Stepping forward over hurdles to promote knee flexion with swing phase of gait- leading with both Rt and Lt Gait in front of mirror for visual feedback   Manual:  Patellar mobs focusing on superior glide Seated anterior tibial mobs and P/ROM with Mulligan strap, P/ROM into flexion   05/30/23 Therex: Nustep L5 x 7 min- for ROM Standing knee flexion stretch on 14 in step  Gait pregait activities in standing working on heel/toe gait and knee flexion during swing Gait in front of mirror for visual feedback   Manual:  IASTM and STM to distal quads, around incision Patellar mobs focusing on superior glide Hooklying anterior mobs with AA flexion Seated anterior tibial mobs and P/ROM with Mulligan strap, P/ROM into flexion    05/28/23 Nustep L5 x 7 min- for ROM Standing knee flexion stretch on 2nd step Standing HS stretch with foot on 1st step and overpressure above knee 2x 20 sec Seated hamstring stretch 3x20 seconds   Lt Weight shifting on balance pad: 3 ways x 10 min each Standing rockerboard x3 minutes  6" step up on Rt 2x10 Mini squat x 20 Seated Lt knee flexion with slider- overpressure with Rt LE Manual: anterior tibial mobs and P/ROM with [employer]  strap, P/ROM into flexion   PATIENT EDUCATION:  Education details: Access Code: [email or web address] Person educated: Patient Education method: Explanation, Demonstration, and Handouts Education comprehension: verbalized understanding and returned demonstration  HOME EXERCISE PROGRAM: Access Code: [email or web address] URL: [email or web address] Date: 05/26/2023 Prepared by: Raynelle Fanning  Exercises - Seated Knee Flexion AAROM  - 3-5 x daily - 7 x weekly - 1 sets - 10 reps - 5-10 hold - Standing Knee Flexion Stretch on Step  - 3-5 x daily - 7 x weekly - 1 sets - 10 reps - 5-10 hold - Supine Quad Set  - 2 x daily - 7 x weekly - 1 sets - 10 reps - 5 hold - Supine Heel Slide with Strap  - 2 x daily - 7 x weekly - 1-2 sets - 10 reps - Standing Hip Abduction with Resistance at Ankles and Counter Support  - 1 x daily - 3-4 x weekly - 3 sets - 10 reps - Standing Hip Extension with Resistance at Ankles and Counter Support  - 1 x daily - 3-4 x weekly - 3 sets - 10 reps - Prone Knee Extension with Ankle Weight  - 2 x daily - 7 x weekly - 1 sets - 1 reps - up to 5 min hold - Prone Quadriceps Stretch with Strap (Mirrored)  - 2-3 x daily - 7 x weekly - 1 sets - 3 reps - 60 sec hold  ASSESSMENT:  CLINICAL IMPRESSION: Focused on manual today to decrease tightness and increase ROM. Gait is altered due to limited Lt knee ROM and pt is working to normalize gait pattern.  Worked on clearing leg over hurdles and was challenged when leading with Rt. Pt did well with addition of weights today. PT monitored pt throughout to provide cueing and monitor for pain.  Patient will benefit from skilled PT to address the below impairments and improve overall function.    OBJECTIVE IMPAIRMENTS:  decreased activity tolerance, decreased coordination, decreased endurance, decreased mobility, difficulty walking, decreased ROM, decreased strength, hypomobility, increased edema, impaired flexibility, and pain.   ACTIVITY LIMITATIONS: sitting, standing, squatting, stairs, transfers, bathing, dressing, hygiene/grooming, and locomotion level  PARTICIPATION LIMITATIONS: cleaning, driving, community activity, occupation, and yard work  PERSONAL FACTORS: 1-2 comorbidities: Lt TKA, lung cancer   are also affecting patient's functional outcome.   REHAB POTENTIAL: Good  CLINICAL DECISION MAKING: Stable/uncomplicated  EVALUATION COMPLEXITY: Low   GOALS: Goals reviewed with patient? Yes  SHORT TERM GOALS: Target date: 06/18/2023   Be independent in initial HEP Baseline: Goal status: met  2.  Demonstrate Lt knee A/ROM flexion to > or = to 95 degrees to allow for sitting and negotiating steps without compensation  Baseline: 65 Goal status: INITIAL  3.  Improve FOTO to > or = to 50  Baseline: 44 Goal status: INITIAL  4.   Improve strength to ascend steps with step-over step gait with use of rail to facilitate return to work Baseline: limited by ROM deficits (06/02/23) Goal status: In progress   5.  Stand and walk > or = to 1 hour without limitation due to pain or fatigue  Baseline: 30 min (06/02/23) Goal status: in progress    LONG TERM GOALS: Target date: 07/16/2023    Be independent in advanced HEP Baseline:  Goal status: INITIAL  2.  Improve FOTO to > or = to 75 Baseline: 44 Goal status: INITIAL  3.  Demonstrate Lt knee A/ROM flexion to > or = to 110 degrees to allow steps and  squatting without compensation Baseline: 65 Goal status: INITIAL  4.  Demonstrate symmetry with ambulation on level surface due to increased strength and ROM Baseline:  Goal status: INITIAL  5.  Improve strength to negotiate steps with step-over step gait with use of rail to facilitate return to  work Baseline:  Goal status: INITIAL  6.  Stand and walk > or = to 1.5 hours without limitation due to pain or fatigue  Baseline:  Goal status: INITIAL   PLAN:  PT FREQUENCY: 2x/week  PT DURATION: 8 weeks  PLANNED INTERVENTIONS: 97110-Therapeutic exercises, 97530- Therapeutic activity, 97112- Neuromuscular re-education, 97535- Self Care, 75643- Manual therapy, [phone]- Gait training, [phone]- Aquatic Therapy, 97014- Electrical stimulation (unattended), [id number]- Electrical stimulation (manual), 97016- Vasopneumatic device, 97035- Ultrasound, Patient/Family education, Balance training, Taping, Joint mobilization, Joint manipulation, Scar mobilization, Vestibular training, and Cryotherapy  PLAN FOR NEXT SESSION: Lt knee ROM, strength, edema management and gait, manual for ROM gains, resume game ready as needed   Lorrene Reid, PT 06/02/23 11:46 AM   [facility] [address] [place], [place] [address] Phone # [phone] Fax [phone]

## 2023-06-03 NOTE — Therapy (Signed)
OUTPATIENT PHYSICAL THERAPY LOWER EXTREMITY TREATMENT   Patient Name: Jesse Stewart MRN: [id number] DOB:[birth year], 58 y.o., male Today's Date: 06/04/2023  END OF SESSION:  PT End of Session - 06/04/23 1058     Visit Number 7    Date for PT Re-Evaluation 07/16/23    Authorization Type [facility] visits approved 12/20-4/19/25    Authorization - Visit Number 7    Authorization - Number of Visits 15    PT Start Time 1016    PT Stop Time 1059    PT Time Calculation (min) 43 min    Activity Tolerance Patient tolerated treatment well    Behavior During Therapy [facility] for tasks assessed/performed                   Past Medical History:  Diagnosis Date   Arthritis    Hypertension    Lung cancer (HCC) 03/13/2022   Past Surgical History:  Procedure Laterality Date   KNEE ARTHROSCOPY W/ MENISCAL REPAIR     LUNG REMOVAL, PARTIAL Right 03/27/2022   TOTAL KNEE ARTHROPLASTY Left 04/29/2023   Patient Active Problem List   Diagnosis Date Noted   Colon polyps 05/16/2019   Obesity (BMI 30.0-34.9) 05/13/2019   Chest tightness 10/06/2014   COVID 10/06/2014   Hematochezia 12/07/2012   Hernia of abdominal wall 12/07/2012   Well adult exam 04/23/2012   Neoplasm of uncertain behavior of skin 05/04/2010   ACTINIC KERATOSIS 05/04/2010   Dyslipidemia 04/04/2008   GERD 04/04/2008   Osteoarthritis 04/04/2008   KNEE PAIN 04/04/2008   ABNORMAL GLUCOSE NEC 04/04/2008   ABNORMAL LIVER FUNCTION TESTS 04/04/2008   Essential hypertension 12/29/2006    PCP: Tresa Garter, MD   REFERRING PROVIDER: Patrina Levering, PA  REFERRING DIAG: Left TKA 04/29/2023   THERAPY DIAG:  Acute pain of left knee  Localized edema  Other abnormalities of gait and mobility  Muscle weakness (generalized)  Rationale for Evaluation and Treatment: Rehabilitation  ONSET DATE: surgery 04/29/23  SUBJECTIVE:   SUBJECTIVE STATEMENT: Doing pretty well.   PERTINENT HISTORY: Lt TKA  04/29/23, Lung cancer with lung resection 03/2022   PAIN: 06/02/23:  Are you having pain? Yes: NPRS scale: 4/10 Pain location: Lt knee  Pain description: throbbing  Aggravating factors: walking, standing  Relieving factors: ice, pain medication, rest   PRECAUTIONS: Other: history of lung cancer   RED FLAGS: None   WEIGHT BEARING RESTRICTIONS: No  FALLS:  Has patient fallen in last 6 months? 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  PATIENT SURVEYS:  05/21/23: FOTO 44 (goal is 75)  COGNITION: Overall cognitive status: Within functional limits for tasks assessed     SENSATION: WFL  EDEMA:  Circumferential: Lt midpatellar 16.5 inches, Rt 15 inches    POSTURE: No Significant postural limitations  PALPATION: Healing surgical incision with steri strip like covering over distal aspect.  One area over proximal aspect that looks like it might open.  PT educated pt and his wife when to seek medical attention.  LOWER  EXTREMITY ROM:  Active ROM A/P Right eval Left eval Left 05/26/23 Left 122/25  Hip flexion Full ROM Full hip ROM    Hip extension      Hip abduction      Hip adduction      Hip internal rotation      Hip external rotation      Knee flexion  65 73 passive 80 passive  Knee extension  10 -3/0   Ankle dorsiflexion      Ankle plantarflexion      Ankle inversion      Ankle eversion       (Blank rows = not tested)  LOWER  EXTREMITY MMT:  MMT Right eval Left eval  Hip flexion 5/5 throughout  4-  Hip extension  4-  Hip abduction    Hip adduction    Hip internal rotation    Hip external rotation    Knee flexion  4  Knee extension  4-  Ankle dorsiflexion  5  Ankle plantarflexion    Ankle inversion    Ankle eversion     (Blank rows = not tested)   GAIT: Distance walked: 50 Assistive device utilized: Single point cane Level of assistance: Complete Independence Comments: reduced Lt knee flexion with swing through.  Pelvic compensation with ascending and descending steps due to limited Lt knee flexion                                                                                                                   TREATMENT DATE:   06/04/23 Therex: Nustep L5 x 7 min for ROM Slider into flexion in sitting x20 Standing knee flexion stretch on 14 in step Step down from bottom step to 2 inch step x 10, then full step down x 15 Leg press: 90# bil seat 6 2x10, Lt only 50# 2x10 Standing quad stretch with Left lower leg on matrix row bench, hands on chair  Gait Stepping forward over hurdles to promote knee flexion with swing phase of gait- leading with both Rt and Lt Gait in front of mirror for visual feedback   Manual:  Patellar mobs focusing on superior glide   06/02/23 Therex: Bike: Level 0 x 7 min- for ROM Slider into flexion in sitting x20 Standing knee flexion stretch on 14 in step Leg press: 90# bil seat 6 2x10, Lt only 50# 2x10  Gait pregait activities in standing working on heel/toe gait and knee flexion during swing Stepping forward over hurdles to promote knee flexion with swing phase of gait- leading with both Rt and Lt Gait in front of mirror for visual feedback   Manual:  Patellar mobs focusing on superior glide Seated anterior tibial mobs and P/ROM with Mulligan strap, P/ROM into flexion   05/30/23 Therex: Nustep L5 x 7 min- for ROM Standing knee flexion stretch on 14 in  step  Gait pregait activities in standing working on heel/toe gait and knee flexion during swing Gait in front of mirror for visual feedback  Manual:  IASTM and STM to distal quads, around incision Patellar mobs focusing on superior glide Hooklying anterior mobs with AA flexion Seated anterior tibial mobs and P/ROM with Mulligan strap, P/ROM into flexion    05/28/23 Nustep L5 x 7 min- for ROM Standing knee flexion stretch on 2nd step Standing HS stretch with foot on 1st step and overpressure above knee 2x 20 sec Seated hamstring stretch 3x20 seconds  Lt Weight shifting on balance pad: 3 ways x 10 min each Standing rockerboard x3 minutes  6" step up on Rt 2x10 Mini squat x 20 Seated Lt knee flexion with slider- overpressure with Rt LE Manual: anterior tibial mobs and P/ROM with Mulligan strap, P/ROM into flexion   PATIENT EDUCATION:  Education details: Access Code: [email or web address] Person educated: Patient Education method: Explanation, Demonstration, and Handouts Education comprehension: verbalized understanding and returned demonstration  HOME EXERCISE PROGRAM: Access Code: [email or web address] URL: [email or web address] Date: 06/04/2023 Prepared by: Raynelle Fanning  Exercises - Seated Knee Flexion AAROM  - 3-5 x daily - 7 x weekly - 1 sets - 10 reps - 5-10 hold - Standing Knee Flexion Stretch on Step  - 3-5 x daily - 7 x weekly - 1 sets - 10 reps - 5-10 hold - Supine Quad Set  - 2 x daily - 7 x weekly - 1 sets - 10 reps - 5 hold - Supine Heel Slide with Strap  - 2 x daily - 7 x weekly - 1-2 sets - 10 reps - Standing Hip Abduction with Resistance at Ankles and Counter Support  - 1 x daily - 3-4 x weekly - 3 sets - 10 reps - Standing Hip Extension with Resistance at Ankles and Counter Support  - 1 x daily - 3-4 x weekly - 3 sets - 10 reps - Prone Knee Extension with Ankle Weight  - 2 x daily - 7 x weekly - 1 sets - 1 reps - up to 5 min hold - Prone Quadriceps Stretch with Strap  (Mirrored)  - 2-3 x daily - 7 x weekly - 1 sets - 3 reps - 60 sec hold - Quadricep Stretch with Chair and Counter Support  - 2-3 x daily - 7 x weekly - 1 sets - 3 reps - 60 sec hold - Forward Step Down with Counter Support at Side  - 1 x daily - 7 x weekly - 2 sets - 10 reps  ASSESSMENT:  CLINICAL IMPRESSION: Delta did well with new stretches today and at end of session after patellar mobs measured 80 deg passively. Earlier in the treatment when stretching on stairs he only got to 75 deg. Patient works hard at home to gain flexion. Continues to have most of his stiffness in the morning.   OBJECTIVE IMPAIRMENTS: decreased activity tolerance, decreased coordination, decreased endurance, decreased mobility, difficulty walking, decreased ROM, decreased strength, hypomobility, increased edema, impaired flexibility, and pain.   ACTIVITY LIMITATIONS: sitting, standing, squatting, stairs, transfers, bathing, dressing, hygiene/grooming, and locomotion level  PARTICIPATION LIMITATIONS: cleaning, driving, community activity, occupation, and yard work  PERSONAL FACTORS: 1-2 comorbidities: Lt TKA, lung cancer   are also affecting patient's functional outcome.   REHAB POTENTIAL: Good  CLINICAL DECISION MAKING: Stable/uncomplicated  EVALUATION COMPLEXITY: Low   GOALS: Goals reviewed with patient? Yes  SHORT TERM GOALS: Target date: 06/18/2023   Be independent in initial HEP Baseline: Goal status: met  2.  Demonstrate Lt knee A/ROM flexion to > or = to 95 degrees to allow for sitting and negotiating steps  without compensation  Baseline: 65 Goal status: INITIAL  3.  Improve FOTO to > or = to 50  Baseline: 44 Goal status: INITIAL  4.   Improve strength to ascend steps with step-over step gait with use of rail to facilitate return to work Baseline: limited by ROM deficits (06/02/23) Goal status: In progress   5.  Stand and walk > or = to 1 hour without limitation due to pain or fatigue   Baseline: 30 min (06/02/23) Goal status: in progress    LONG TERM GOALS: Target date: 07/16/2023    Be independent in advanced HEP Baseline:  Goal status: INITIAL  2.  Improve FOTO to > or = to 75 Baseline: 44 Goal status: INITIAL  3.  Demonstrate Lt knee A/ROM flexion to > or = to 110 degrees to allow steps and squatting without compensation Baseline: 65 Goal status: INITIAL  4.  Demonstrate symmetry with ambulation on level surface due to increased strength and ROM Baseline:  Goal status: INITIAL  5.  Improve strength to negotiate steps with step-over step gait with use of rail to facilitate return to work Baseline:  Goal status: INITIAL  6.  Stand and walk > or = to 1.5 hours without limitation due to pain or fatigue  Baseline:  Goal status: INITIAL   PLAN:  PT FREQUENCY: 2x/week  PT DURATION: 8 weeks  PLANNED INTERVENTIONS: 97110-Therapeutic exercises, 97530- Therapeutic activity, 97112- Neuromuscular re-education, 97535- Self Care, 09811- Manual therapy, [phone]- Gait training, [phone]- Aquatic Therapy, 97014- Electrical stimulation (unattended), [id number]- Electrical stimulation (manual), 97016- Vasopneumatic device, 97035- Ultrasound, Patient/Family education, Balance training, Taping, Joint mobilization, Joint manipulation, Scar mobilization, Vestibular training, and Cryotherapy  PLAN FOR NEXT SESSION: Do MD note on 1/27, Lt knee ROM, strength, edema management and gait, manual for ROM gains, resume game ready as needed   Solon Palm, PT  06/04/23 11:01 AM   [facility] [address], [address] [place], [place] [address] Phone # [phone] Fax [phone]

## 2023-06-04 ENCOUNTER — Ambulatory Visit: Payer: No Typology Code available for payment source | Admitting: Physical Therapy

## 2023-06-04 ENCOUNTER — Encounter: Payer: Self-pay | Admitting: Physical Therapy

## 2023-06-04 DIAGNOSIS — M6281 Muscle weakness (generalized): Secondary | ICD-10-CM

## 2023-06-04 DIAGNOSIS — R600 Localized edema: Secondary | ICD-10-CM

## 2023-06-04 DIAGNOSIS — R2689 Other abnormalities of gait and mobility: Secondary | ICD-10-CM

## 2023-06-04 DIAGNOSIS — M25562 Pain in left knee: Secondary | ICD-10-CM

## 2023-06-04 DIAGNOSIS — Z471 Aftercare following joint replacement surgery: Secondary | ICD-10-CM | POA: Diagnosis not present

## 2023-06-05 NOTE — Therapy (Signed)
OUTPATIENT PHYSICAL THERAPY LOWER EXTREMITY TREATMENT   Patient Name: Jesse Stewart MRN: [id number] DOB:1966/04/07, 58 y.o., male Today's Date: 06/06/2023  END OF SESSION:  PT End of Session - 06/06/23 0931     Visit Number 8    Date for PT Re-Evaluation 07/16/23    Authorization Type [facility]-15 visits approved 12/20-4/19/25    Authorization - Visit Number 8    Authorization - Number of Visits 15    PT Start Time 0845    PT Stop Time 0929    PT Time Calculation (min) 44 min    Activity Tolerance Patient tolerated treatment well    Behavior During Therapy [facility] for tasks assessed/performed                    Past Medical History:  Diagnosis Date   Arthritis    Hypertension    Lung cancer (HCC) 03/13/2022   Past Surgical History:  Procedure Laterality Date   KNEE ARTHROSCOPY W/ MENISCAL REPAIR     LUNG REMOVAL, PARTIAL Right 03/27/2022   TOTAL KNEE ARTHROPLASTY Left 04/29/2023   Patient Active Problem List   Diagnosis Date Noted   Colon polyps 05/16/2019   Obesity (BMI 30.0-34.9) 05/13/2019   Chest tightness 10/06/2014   COVID 10/06/2014   Hematochezia 12/07/2012   Hernia of abdominal wall 12/07/2012   Well adult exam 04/23/2012   Neoplasm of uncertain behavior of skin 05/04/2010   ACTINIC KERATOSIS 05/04/2010   Dyslipidemia 04/04/2008   GERD 04/04/2008   Osteoarthritis 04/04/2008   KNEE PAIN 04/04/2008   ABNORMAL GLUCOSE NEC 04/04/2008   ABNORMAL LIVER FUNCTION TESTS 04/04/2008   Essential hypertension 12/29/2006    PCP: Tresa Garter, MD   REFERRING PROVIDER: Patrina Levering, PA  REFERRING DIAG: Left TKA 04/29/2023   THERAPY DIAG:  Acute pain of left knee  Localized edema  Other abnormalities of gait and mobility  Muscle weakness (generalized)  Rationale for Evaluation and Treatment: Rehabilitation  ONSET DATE: surgery 04/29/23  SUBJECTIVE:   SUBJECTIVE STATEMENT: I've been doing the seated stretch a lot which has  really helped.  PERTINENT HISTORY: Lt TKA 04/29/23, Lung cancer with lung resection 03/2022   PAIN: 06/02/23:  Are you having pain? Yes: NPRS scale: 4/10 Pain location: Lt knee  Pain description: throbbing  Aggravating factors: walking, standing  Relieving factors: ice, pain medication, rest   PRECAUTIONS: Other: history of lung cancer   RED FLAGS: None   WEIGHT BEARING RESTRICTIONS: No  FALLS:  Has patient fallen in last 6 months? 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  PATIENT SURVEYS:  05/21/23: FOTO 44 (goal is 75)  COGNITION: Overall cognitive status: Within functional limits for tasks assessed     SENSATION: WFL  EDEMA:  Circumferential: Lt midpatellar 16.5 inches, Rt 15 inches    POSTURE: No Significant postural limitations  PALPATION: Healing surgical incision with steri strip like covering over distal aspect.  One area over proximal aspect that looks like it might open.  PT educated pt and  his wife when to seek medical attention.  LOWER EXTREMITY ROM:  Active ROM A/P Right eval Left eval Left 05/26/23 Left 122/25  Hip flexion Full ROM Full hip ROM    Hip extension      Hip abduction      Hip adduction      Hip internal rotation      Hip external rotation      Knee flexion  65 73 passive 80 passive  Knee extension  10 -3/0   Ankle dorsiflexion      Ankle plantarflexion      Ankle inversion      Ankle eversion        (Blank rows = not tested)  LOWER EXTREMITY MMT:  MMT Right eval Left eval  Hip flexion 5/5 throughout  4-  Hip extension  4-  Hip abduction    Hip adduction    Hip internal rotation    Hip external rotation    Knee flexion  4  Knee extension  4-  Ankle dorsiflexion  5  Ankle plantarflexion    Ankle inversion    Ankle eversion     (Blank rows = not tested)   GAIT: Distance walked: 50 Assistive device utilized: Single point cane Level of assistance: Complete Independence Comments: reduced Lt knee flexion with swing through.  Pelvic compensation with ascending and descending steps due to limited Lt knee flexion                                                                                                                   TREATMENT DATE:   06/06/23 Manual:  Patellar mobs focusing on superior glide  Therex: Bike L0 x 7 min for ROM Slider into flexion in sitting x20 Standing knee flexion stretch on 14 in step Step down from bottom step to 2 inch step x 10, then full step down x 20 Leg press: 90# bil seat 6 2x10, Lt only 50# 2x10 Standing quad stretch with Left lower leg on chair Seated HS curl on fitter one blue x 20 Seated HS curl red band x 5 for demo to use at home Also recommended prone HS curl  Gait Stepping forward over hurdles to promote knee flexion with swing phase of gait- leading with both Rt and Lt    06/04/23 Therex: Nustep L5 x 7 min for ROM Slider into flexion in sitting x20 Standing knee flexion stretch on 14 in step Step down from bottom step to 2 inch step x 10, then full step down x 15 Leg press: 90# bil seat 6 2x10, Lt only 50# 2x10 Standing quad stretch with Left lower leg on matrix row bench, hands on chair  Gait Stepping forward over hurdles to promote knee flexion with swing phase of gait- leading with both Rt and Lt Gait in front of mirror for visual feedback   Manual:  Patellar mobs focusing on superior  glide   06/02/23 Therex: Bike: Level 0 x 7 min- for ROM Slider into flexion  in sitting x20 Standing knee flexion stretch on 14 in step Leg press: 90# bil seat 6 2x10, Lt only 50# 2x10  Gait pregait activities in standing working on heel/toe gait and knee flexion during swing Stepping forward over hurdles to promote knee flexion with swing phase of gait- leading with both Rt and Lt Gait in front of mirror for visual feedback   Manual:  Patellar mobs focusing on superior glide Seated anterior tibial mobs and P/ROM with Mulligan strap, P/ROM into flexion   05/30/23 Therex: Nustep L5 x 7 min- for ROM Standing knee flexion stretch on 14 in step  Gait pregait activities in standing working on heel/toe gait and knee flexion during swing Gait in front of mirror for visual feedback   Manual:  IASTM and STM to distal quads, around incision Patellar mobs focusing on superior glide Hooklying anterior mobs with AA flexion Seated anterior tibial mobs and P/ROM with Mulligan strap, P/ROM into flexion    05/28/23 Nustep L5 x 7 min- for ROM Standing knee flexion stretch on 2nd step Standing HS stretch with foot on 1st step and overpressure above knee 2x 20 sec Seated hamstring stretch 3x20 seconds  Lt Weight shifting on balance pad: 3 ways x 10 min each Standing rockerboard x3 minutes  6" step up on Rt 2x10 Mini squat x 20 Seated Lt knee flexion with slider- overpressure with Rt LE Manual: anterior tibial mobs and P/ROM with Mulligan strap, P/ROM into flexion   PATIENT EDUCATION:  Education details: Access Code: [email or web address] Person educated: Patient Education method: Explanation, Demonstration, and Handouts Education comprehension: verbalized understanding and returned demonstration  HOME EXERCISE PROGRAM: Access Code: [email or web address] URL: [email or web address] Date: 06/04/2023 Prepared by: Raynelle Fanning  Exercises - Seated Knee Flexion AAROM  - 3-5 x daily - 7 x weekly - 1 sets -  10 reps - 5-10 hold - Standing Knee Flexion Stretch on Step  - 3-5 x daily - 7 x weekly - 1 sets - 10 reps - 5-10 hold - Supine Quad Set  - 2 x daily - 7 x weekly - 1 sets - 10 reps - 5 hold - Supine Heel Slide with Strap  - 2 x daily - 7 x weekly - 1-2 sets - 10 reps - Standing Hip Abduction with Resistance at Ankles and Counter Support  - 1 x daily - 3-4 x weekly - 3 sets - 10 reps - Standing Hip Extension with Resistance at Ankles and Counter Support  - 1 x daily - 3-4 x weekly - 3 sets - 10 reps - Prone Knee Extension with Ankle Weight  - 2 x daily - 7 x weekly - 1 sets - 1 reps - up to 5 min hold - Prone Quadriceps Stretch with Strap (Mirrored)  - 2-3 x daily - 7 x weekly - 1 sets - 3 reps - 60 sec hold - Quadricep Stretch with Chair and Counter Support  - 2-3 x daily - 7 x weekly - 1 sets - 3 reps - 60 sec hold - Forward Step Down with Counter Support at Side  - 1 x daily - 7 x weekly - 2 sets - 10 reps  ASSESSMENT:  CLINICAL IMPRESSION: Noticeable improvement in superior patellar mobility today and pt measured 82 deg flexion. All flexion exercises were smoother and easier today. Patient is still limited, but slowly making gains. Returns to MD on Tuesday.    OBJECTIVE IMPAIRMENTS: decreased activity tolerance, decreased coordination, decreased endurance, decreased mobility, difficulty walking, decreased ROM,  decreased strength, hypomobility, increased edema, impaired flexibility, and pain.   ACTIVITY LIMITATIONS: sitting, standing, squatting, stairs, transfers, bathing, dressing, hygiene/grooming, and locomotion level  PARTICIPATION LIMITATIONS: cleaning, driving, community activity, occupation, and yard work  PERSONAL FACTORS: 1-2 comorbidities: Lt TKA, lung cancer   are also affecting patient's functional outcome.   REHAB POTENTIAL: Good  CLINICAL DECISION MAKING: Stable/uncomplicated  EVALUATION COMPLEXITY: Low   GOALS: Goals reviewed with patient? Yes  SHORT TERM GOALS:  Target date: 06/18/2023   Be independent in initial HEP Baseline: Goal status: met  2.  Demonstrate Lt knee A/ROM flexion to > or = to 95 degrees to allow for sitting and negotiating steps without compensation  Baseline: 65 Goal status: INITIAL  3.  Improve FOTO to > or = to 50  Baseline: 44 Goal status: INITIAL  4.   Improve strength to ascend steps with step-over step gait with use of rail to facilitate return to work Baseline: limited by ROM deficits (06/02/23) Goal status: In progress   5.  Stand and walk > or = to 1 hour without limitation due to pain or fatigue  Baseline: 30 min (06/02/23) Goal status: in progress    LONG TERM GOALS: Target date: 07/16/2023    Be independent in advanced HEP Baseline:  Goal status: INITIAL  2.  Improve FOTO to > or = to 75 Baseline: 44 Goal status: INITIAL  3.  Demonstrate Lt knee A/ROM flexion to > or = to 110 degrees to allow steps and squatting without compensation Baseline: 65 Goal status: INITIAL  4.  Demonstrate symmetry with ambulation on level surface due to increased strength and ROM Baseline:  Goal status: INITIAL  5.  Improve strength to negotiate steps with step-over step gait with use of rail to facilitate return to work Baseline:  Goal status: INITIAL  6.  Stand and walk > or = to 1.5 hours without limitation due to pain or fatigue  Baseline:  Goal status: INITIAL   PLAN:  PT FREQUENCY: 2x/week  PT DURATION: 8 weeks  PLANNED INTERVENTIONS: 97110-Therapeutic exercises, 97530- Therapeutic activity, 97112- Neuromuscular re-education, 97535- Self Care, 40981- Manual therapy, [phone]- Gait training, [phone]- Aquatic Therapy, 97014- Electrical stimulation (unattended), [id number]- Electrical stimulation (manual), 97016- Vasopneumatic device, 97035- Ultrasound, Patient/Family education, Balance training, Taping, Joint mobilization, Joint manipulation, Scar mobilization, Vestibular training, and Cryotherapy  PLAN FOR NEXT  SESSION: Do MD note on 1/27, Lt knee ROM, strength, edema management and gait, manual for ROM gains, resume game ready as needed   Solon Palm, PT  06/06/23 9:32 AM   [facility] [address] [place], [place] [address] Phone # [phone] Fax [phone]

## 2023-06-06 ENCOUNTER — Ambulatory Visit: Payer: No Typology Code available for payment source | Admitting: Physical Therapy

## 2023-06-06 ENCOUNTER — Encounter: Payer: Self-pay | Admitting: Physical Therapy

## 2023-06-06 DIAGNOSIS — R600 Localized edema: Secondary | ICD-10-CM

## 2023-06-06 DIAGNOSIS — M25562 Pain in left knee: Secondary | ICD-10-CM

## 2023-06-06 DIAGNOSIS — M6281 Muscle weakness (generalized): Secondary | ICD-10-CM

## 2023-06-06 DIAGNOSIS — Z471 Aftercare following joint replacement surgery: Secondary | ICD-10-CM | POA: Diagnosis not present

## 2023-06-06 DIAGNOSIS — R2689 Other abnormalities of gait and mobility: Secondary | ICD-10-CM

## 2023-06-08 NOTE — Therapy (Signed)
OUTPATIENT PHYSICAL THERAPY LOWER EXTREMITY TREATMENT AND MD NOTE   Patient Name: Jesse Stewart MRN: [id number] DOB:12/19/1965, 58 y.o., male Today's Date: 06/09/2023  END OF SESSION:  PT End of Session - 06/09/23 0936     Visit Number 9    Date for PT Re-Evaluation 07/16/23    Authorization Type [facility]-15 visits approved 12/20-4/19/25    Authorization - Visit Number 9    Authorization - Number of Visits 15    PT Start Time 0930    PT Stop Time 1012    PT Time Calculation (min) 42 min    Activity Tolerance Patient tolerated treatment well    Behavior During Therapy [facility] for tasks assessed/performed             Past Medical History:  Diagnosis Date   Arthritis    Hypertension    Lung cancer (HCC) 03/13/2022   Past Surgical History:  Procedure Laterality Date   KNEE ARTHROSCOPY W/ MENISCAL REPAIR     LUNG REMOVAL, PARTIAL Right 03/27/2022   TOTAL KNEE ARTHROPLASTY Left 04/29/2023   Patient Active Problem List   Diagnosis Date Noted   Colon polyps 05/16/2019   Obesity (BMI 30.0-34.9) 05/13/2019   Chest tightness 10/06/2014   COVID 10/06/2014   Hematochezia 12/07/2012   Hernia of abdominal wall 12/07/2012   Well adult exam 04/23/2012   Neoplasm of uncertain behavior of skin 05/04/2010   ACTINIC KERATOSIS 05/04/2010   Dyslipidemia 04/04/2008   GERD 04/04/2008   Osteoarthritis 04/04/2008   KNEE PAIN 04/04/2008   ABNORMAL GLUCOSE NEC 04/04/2008   ABNORMAL LIVER FUNCTION TESTS 04/04/2008   Essential hypertension 12/29/2006    PCP: Tresa Garter, MD   REFERRING PROVIDER: Patrina Levering, PA  REFERRING DIAG: Left TKA 04/29/2023   THERAPY DIAG:  Acute pain of left knee  Localized edema  Other abnormalities of gait and mobility  Muscle weakness (generalized)  Rationale for Evaluation and Treatment: Rehabilitation  ONSET DATE: surgery 04/29/23  SUBJECTIVE:   SUBJECTIVE STATEMENT: It's feeling pretty good.  PERTINENT HISTORY: Lt TKA  04/29/23, Lung cancer with lung resection 03/2022   PAIN: 06/02/23:  Are you having pain? Yes: NPRS scale: 4/10 Pain location: Lt knee  Pain description: throbbing  Aggravating factors: walking, standing  Relieving factors: ice, pain medication, rest   PRECAUTIONS: Other: history of lung cancer   RED FLAGS: None   WEIGHT BEARING RESTRICTIONS: No  FALLS:  Has patient fallen in last 6 months? 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  PATIENT SURVEYS:  05/21/23: FOTO 44 (goal is 75)  COGNITION: Overall cognitive status: Within functional limits for tasks assessed     SENSATION: WFL  EDEMA:  Circumferential: Lt midpatellar 16.5 inches, Rt 15 inches    POSTURE: No Significant postural limitations  PALPATION: Healing surgical incision with steri strip like covering over distal aspect.  One area over proximal aspect that looks like it might open.  PT educated pt and his wife when to seek medical attention.  LOWER EXTREMITY ROM:  Active ROM A/P Right eval Left eval Left 05/26/23 Left 122/25 Left 06/09/23  Hip flexion Full ROM Full hip ROM     Hip extension       Hip abduction       Hip adduction       Hip internal rotation       Hip external rotation       Knee flexion  65 73 passive 80 passive 83 A /87 P  Knee extension  10 -3/0  0  Ankle dorsiflexion       Ankle plantarflexion       Ankle inversion       Ankle eversion        (Blank  rows = not tested)  LOWER EXTREMITY MMT:  MMT Right eval Left eval Left 06/09/23  Hip flexion 5/5 throughout  4- 5  Hip extension  4- 5  Hip abduction     Hip adduction     Hip internal rotation     Hip external rotation     Knee flexion  4 4+  Knee extension  4- 5  Ankle dorsiflexion  5   Ankle plantarflexion     Ankle inversion     Ankle eversion      (Blank rows = not tested)   GAIT: Distance walked: 50 Assistive device utilized: Single point cane Level of assistance: Complete Independence Comments: reduced Lt knee flexion with swing through.  Pelvic compensation with ascending and descending steps due to limited Lt knee flexion                                                                                                                   TREATMENT DATE:  06/09/23  Manual:  Patellar mobs focusing on superior glide ROM/MMT/goals assessed  Therex: Nustep L 3 x 7 min for ROM Standing HS stretch 2x30 sec on second step Slider into flexion in sitting x20 Standing knee flexion stretch on 14 in step Step down full step down x 20 Up/down stairs x 6 Leg press: 90# bil seat 6 3x10, Lt only 50# 3x10 Standing quad stretch with Left lower leg on chair Seated HS curl on fitter one blue x 20 Seated HS curl blue band x 10 Prone HS curl blue band x 10   06/06/23 Manual:  Patellar mobs focusing on superior glide  Therex: Bike L0 x 7 min for ROM Slider into flexion in sitting x20 Standing knee flexion stretch on 14 in step Step down from bottom step to 2 inch step x 10, then full step down x 20 Leg press: 90# bil seat 6 2x10, Lt only 50# 2x10 Standing quad stretch with Left lower leg on chair Seated HS curl on fitter one blue x 20 Seated HS curl red band x 5 for demo to use at home Also recommended prone HS curl  Gait Stepping forward over hurdles to promote knee flexion with swing phase of gait- leading with both Rt and Lt    06/04/23 Therex:  Nustep L5 x 7 min  for ROM Slider into flexion in sitting x20 Standing knee flexion stretch on 14 in step Step down from bottom step to 2 inch step x 10, then full step down x 15 Leg press: 90# bil seat 6 2x10, Lt only 50# 2x10 Standing quad stretch with Left lower leg on matrix row bench, hands on chair  Gait Stepping forward over hurdles to promote knee flexion with swing phase of gait- leading with both Rt and Lt Gait in front of mirror for visual feedback   Manual:  Patellar mobs focusing on superior glide   06/02/23 Therex: Bike: Level 0 x 7 min- for ROM Slider into flexion in sitting x20 Standing knee flexion stretch on 14 in step Leg press: 90# bil seat 6 2x10, Lt only 50# 2x10  Gait pregait activities in standing working on heel/toe gait and knee flexion during swing Stepping forward over hurdles to promote knee flexion with swing phase of gait- leading with both Rt and Lt Gait in front of mirror for visual feedback   Manual:  Patellar mobs focusing on superior glide Seated anterior tibial mobs and P/ROM with Mulligan strap, P/ROM into flexion   05/30/23 Therex: Nustep L5 x 7 min- for ROM Standing knee flexion stretch on 14 in step  Gait pregait activities in standing working on heel/toe gait and knee flexion during swing Gait in front of mirror for visual feedback   Manual:  IASTM and STM to distal quads, around incision Patellar mobs focusing on superior glide Hooklying anterior mobs with AA flexion Seated anterior tibial mobs and P/ROM with Mulligan strap, P/ROM into flexion    05/28/23 Nustep L5 x 7 min- for ROM Standing knee flexion stretch on 2nd step Standing HS stretch with foot on 1st step and overpressure above knee 2x 20 sec Seated hamstring stretch 3x20 seconds  Lt Weight shifting on balance pad: 3 ways x 10 min each Standing rockerboard x3 minutes  6" step up on Rt 2x10 Mini squat x 20 Seated Lt knee flexion with slider- overpressure with Rt LE Manual: anterior  tibial mobs and P/ROM with Mulligan strap, P/ROM into flexion   PATIENT EDUCATION:  Education details: Access Code: [email or web address] Person educated: Patient Education method: Explanation, Demonstration, and Handouts Education comprehension: verbalized understanding and returned demonstration  HOME EXERCISE PROGRAM: Access Code: [email or web address] URL: [email or web address] Date: 06/04/2023 Prepared by: Raynelle Fanning  Exercises - Seated Knee Flexion AAROM  - 3-5 x daily - 7 x weekly - 1 sets - 10 reps - 5-10 hold - Standing Knee Flexion Stretch on Step  - 3-5 x daily - 7 x weekly - 1 sets - 10 reps - 5-10 hold - Supine Quad Set  - 2 x daily - 7 x weekly - 1 sets - 10 reps - 5 hold - Supine Heel Slide with Strap  - 2 x daily - 7 x weekly - 1-2 sets - 10 reps - Standing Hip Abduction with Resistance at Ankles and Counter Support  - 1 x daily - 3-4 x weekly - 3 sets - 10 reps - Standing Hip Extension with Resistance at Ankles and Counter Support  - 1 x daily - 3-4 x weekly - 3 sets - 10 reps - Prone Knee Extension with Ankle Weight  - 2 x daily - 7 x weekly - 1 sets - 1 reps - up to 5 min hold - Prone Quadriceps Stretch with Strap (Mirrored)  - 2-3 x daily - 7 x weekly -  1 sets - 3 reps - 60 sec hold - Quadricep Stretch with Chair and Counter Support  - 2-3 x daily - 7 x weekly - 1 sets - 3 reps - 60 sec hold - Forward Step Down with Counter Support at Side  - 1 x daily - 7 x weekly - 2 sets - 10 reps  ASSESSMENT:  CLINICAL IMPRESSION: Cael Worth has progressed with L knee flexion, but is still very limited to less than 90 degrees. He is very compliant with HEP including stretching. He is able to ascend stairs reciprocally and descend as well, but has some deviation due to knee flexion deficits. Gait is affected also and patient has to elevate hip with swing phase. He continues to demonstrate potential for improvement and would benefit from continued skilled therapy to address impairments.      OBJECTIVE IMPAIRMENTS: decreased activity tolerance, decreased coordination, decreased endurance, decreased mobility, difficulty walking, decreased ROM, decreased strength, hypomobility, increased edema, impaired flexibility, and pain.   ACTIVITY LIMITATIONS: sitting, standing, squatting, stairs, transfers, bathing, dressing, hygiene/grooming, and locomotion level  PARTICIPATION LIMITATIONS: cleaning, driving, community activity, occupation, and yard work  PERSONAL FACTORS: 1-2 comorbidities: Lt TKA, lung cancer   are also affecting patient's functional outcome.   REHAB POTENTIAL: Good  CLINICAL DECISION MAKING: Stable/uncomplicated  EVALUATION COMPLEXITY: Low   GOALS: Goals reviewed with patient? Yes  SHORT TERM GOALS: Target date: 06/18/2023   Be independent in initial HEP Baseline: Goal status: MET  2.  Demonstrate Lt knee A/ROM flexion to > or = to 95 degrees to allow for sitting and negotiating steps without compensation  Baseline: 65 Goal status: IN PROGRESS  3.  Improve FOTO to > or = to 50  Baseline: 44 Goal status: MET 69 on 1/27  4.   Improve strength to ascend steps with step-over step gait with use of rail to facilitate return to work Baseline: limited by ROM deficits (06/02/23) Goal status: MET  5.  Stand and walk > or = to 1 hour without limitation due to pain or fatigue  Baseline: 30 min (06/02/23) Goal status: IN PROGRESS (hasn't tried more than 20 min but thinks he could)   LONG TERM GOALS: Target date: 07/16/2023    Be independent in advanced HEP Baseline:  Goal status: INITIAL  2.  Improve FOTO to > or = to 75 Baseline: 44 Goal status: MET 1/27  3.  Demonstrate Lt knee A/ROM flexion to > or = to 110 degrees to allow steps and squatting without compensation Baseline: 65 Goal status: INITIAL  4.  Demonstrate symmetry with ambulation on level surface due to increased strength and ROM Baseline:  Goal status: INITIAL  5.  Improve strength to  negotiate steps with step-over step gait with use of rail to facilitate return to work Baseline:  Goal status: INITIAL  6.  Stand and walk > or = to 1.5 hours without limitation due to pain or fatigue  Baseline:  Goal status: INITIAL   PLAN:  PT FREQUENCY: 2x/week  PT DURATION: 8 weeks  PLANNED INTERVENTIONS: 97110-Therapeutic exercises, 97530- Therapeutic activity, [id number]- Neuromuscular re-education, 97535- Self Care, 13086- Manual therapy, [id number]- Gait training, [phone]- Aquatic Therapy, 97014- Electrical stimulation (unattended), [id number]- Electrical stimulation (manual), 97016- Vasopneumatic device, 97035- Ultrasound, Patient/Family education, Balance training, Taping, Joint mobilization, Joint manipulation, Scar mobilization, Vestibular training, and Cryotherapy  PLAN FOR NEXT SESSION: Lt knee ROM, strength, gait, manual for ROM gains   Solon Palm, PT  06/09/23 10:17 AM  [facility] [address], [address] [place], [place] [address] Phone # [phone] Fax [phone]

## 2023-06-09 ENCOUNTER — Encounter: Payer: Self-pay | Admitting: Physical Therapy

## 2023-06-09 ENCOUNTER — Ambulatory Visit: Payer: No Typology Code available for payment source | Admitting: Physical Therapy

## 2023-06-09 DIAGNOSIS — M6281 Muscle weakness (generalized): Secondary | ICD-10-CM

## 2023-06-09 DIAGNOSIS — R600 Localized edema: Secondary | ICD-10-CM

## 2023-06-09 DIAGNOSIS — R2689 Other abnormalities of gait and mobility: Secondary | ICD-10-CM

## 2023-06-09 DIAGNOSIS — Z471 Aftercare following joint replacement surgery: Secondary | ICD-10-CM | POA: Diagnosis not present

## 2023-06-09 DIAGNOSIS — M25562 Pain in left knee: Secondary | ICD-10-CM

## 2023-06-11 ENCOUNTER — Ambulatory Visit: Payer: No Typology Code available for payment source

## 2023-06-11 DIAGNOSIS — R2689 Other abnormalities of gait and mobility: Secondary | ICD-10-CM

## 2023-06-11 DIAGNOSIS — M6281 Muscle weakness (generalized): Secondary | ICD-10-CM

## 2023-06-11 DIAGNOSIS — Z471 Aftercare following joint replacement surgery: Secondary | ICD-10-CM | POA: Diagnosis not present

## 2023-06-11 DIAGNOSIS — M25562 Pain in left knee: Secondary | ICD-10-CM

## 2023-06-11 DIAGNOSIS — R600 Localized edema: Secondary | ICD-10-CM

## 2023-06-11 NOTE — Therapy (Signed)
OUTPATIENT PHYSICAL THERAPY LOWER EXTREMITY TREATMENT AND MD NOTE   Patient Name: Jesse Stewart MRN: [id number] DOB:1966-01-05, 58 y.o., male Today's Date: 06/11/2023  END OF SESSION:  PT End of Session - 06/11/23 1057     Visit Number 10    Date for PT Re-Evaluation 07/16/23    Authorization Type [employer]-15 visits approved 12/20-4/19/25    Authorization - Visit Number 10    Authorization - Number of Visits 15    PT Start Time 1017    PT Stop Time 1058    PT Time Calculation (min) 41 min    Activity Tolerance Patient tolerated treatment well    Behavior During Therapy [facility] for tasks assessed/performed              Past Medical History:  Diagnosis Date   Arthritis    Hypertension    Lung cancer (HCC) 03/13/2022   Past Surgical History:  Procedure Laterality Date   KNEE ARTHROSCOPY W/ MENISCAL REPAIR     LUNG REMOVAL, PARTIAL Right 03/27/2022   TOTAL KNEE ARTHROPLASTY Left 04/29/2023   Patient Active Problem List   Diagnosis Date Noted   Colon polyps 05/16/2019   Obesity (BMI 30.0-34.9) 05/13/2019   Chest tightness 10/06/2014   COVID 10/06/2014   Hematochezia 12/07/2012   Hernia of abdominal wall 12/07/2012   Well adult exam 04/23/2012   Neoplasm of uncertain behavior of skin 05/04/2010   ACTINIC KERATOSIS 05/04/2010   Dyslipidemia 04/04/2008   GERD 04/04/2008   Osteoarthritis 04/04/2008   KNEE PAIN 04/04/2008   ABNORMAL GLUCOSE NEC 04/04/2008   ABNORMAL LIVER FUNCTION TESTS 04/04/2008   Essential hypertension 12/29/2006    PCP: Tresa Garter, MD   REFERRING PROVIDER: Patrina Levering, PA  REFERRING DIAG: Left TKA 04/29/2023   THERAPY DIAG:  Acute pain of left knee  Localized edema  Other abnormalities of gait and mobility  Muscle weakness (generalized)  Rationale for Evaluation and Treatment: Rehabilitation  ONSET DATE: surgery 04/29/23  SUBJECTIVE:   SUBJECTIVE STATEMENT: I saw the MD yesterday.  He wants me to keep working  on ROM and will check back in 6 weeks to see progress.   PERTINENT HISTORY: Lt TKA 04/29/23, Lung cancer with lung resection 03/2022   PAIN: 06/11/23:  Are you having pain? Yes: NPRS scale: 1/10 (just stiff) Pain location: Lt knee  Pain description: throbbing  Aggravating factors: walking, standing  Relieving factors: ice, pain medication, rest   PRECAUTIONS: Other: history of lung cancer   RED FLAGS: None   WEIGHT BEARING RESTRICTIONS: No  FALLS:  Has patient fallen in last 6 months? 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  PATIENT SURVEYS:  05/21/23: FOTO 44 (goal is 75)  COGNITION: Overall cognitive status: Within functional limits for tasks assessed     SENSATION: WFL  EDEMA:  Circumferential: Lt midpatellar 16.5 inches, Rt 15 inches    POSTURE: No Significant postural limitations  PALPATION: Healing surgical incision with steri strip like covering over distal aspect.  One area over proximal  aspect that looks like it might open.  PT educated pt and his wife when to seek medical attention.  LOWER EXTREMITY ROM:  Active ROM A/P Right eval Left eval Left 05/26/23 Left 122/25 Left 06/09/23  Hip flexion Full ROM Full hip ROM     Hip extension       Hip abduction       Hip adduction       Hip internal rotation       Hip external rotation       Knee flexion  65 73 passive 80 passive 83 A /87 P  Knee extension  10 -3/0  0  Ankle  dorsiflexion       Ankle plantarflexion       Ankle inversion       Ankle eversion        (Blank rows = not tested)  LOWER EXTREMITY MMT:  MMT Right eval Left eval Left 06/09/23  Hip flexion 5/5 throughout  4- 5  Hip extension  4- 5  Hip abduction     Hip adduction     Hip internal rotation     Hip external rotation     Knee flexion  4 4+  Knee extension  4- 5  Ankle dorsiflexion  5   Ankle plantarflexion     Ankle inversion     Ankle eversion      (Blank rows = not tested)   GAIT: Distance walked: 50 Assistive device utilized: Single point cane Level of assistance: Complete Independence Comments: reduced Lt knee flexion with swing through.  Pelvic compensation with ascending and descending steps due to limited Lt knee flexion                                                                                                                   TREATMENT DATE:  06/11/23  Manual:  Patellar mobs focusing on superior glide Mulligan strap mobs with Passive into flexion  Therex: Bike for ROM with prolonged hold x8 min Standing HS stretch 2x30 sec on second step Slider into flexion in sitting x20 Standing knee flexion stretch on 14 in step Step down full step down x 20 Up/down stairs x 6 Leg press: 90# bil seat 6 3x10, Lt only 50# 3x10 Standing quad stretch with Left lower leg on chair Seated HS curl on fitter one blue x 20 Seated HS curl blue band x 10 Prone HS curl blue band x 10  06/09/23  Manual:  Patellar mobs focusing on superior glide ROM/MMT/goals assessed  Therex: Nustep L 3 x 7 min for ROM Standing HS stretch 2x30 sec on second step Slider into flexion in sitting x20 Standing knee flexion stretch on 14 in step Step down full step down x 20 Up/down stairs x 6 Leg press: 90# bil seat 6 3x10, Lt only 50# 3x10  Seated HS curl on fitter one blue x 20 Seated HS curl blue band x 10 Prone HS curl blue band x 10   06/06/23 Manual:  Patellar mobs focusing  on superior glide  Therex: Bike L0 x 7 min for ROM Slider into flexion in sitting x20 Standing knee flexion stretch on 14 in step Step down from bottom step to 2 inch step x 10, then full step down x 20 Leg press: 90# bil seat 6 2x10, Lt only 50# 2x10 Standing quad stretch with Left lower leg on chair Seated HS curl on fitter one blue x 20 Seated HS curl red band x 5 for demo to use at home Also recommended prone HS curl  Gait Stepping forward over hurdles to promote knee flexion with swing phase of gait- leading with both Rt and Lt   PATIENT EDUCATION:  Education details: Access Code: [email or web address] Person educated: Patient Education method: Explanation, Demonstration, and Handouts Education comprehension: verbalized understanding and returned demonstration  HOME EXERCISE PROGRAM: Access Code: [email or web address] URL: [email or web address] Date: 06/04/2023 Prepared by: Raynelle Fanning  Exercises - Seated Knee Flexion AAROM  - 3-5 x daily - 7 x weekly - 1 sets - 10 reps - 5-10 hold - Standing Knee Flexion Stretch on Step  - 3-5 x daily - 7 x weekly - 1 sets - 10 reps - 5-10 hold - Supine Quad Set  - 2 x daily - 7 x weekly - 1 sets - 10 reps - 5 hold - Supine Heel Slide with Strap  - 2 x daily - 7 x weekly - 1-2 sets - 10 reps - Standing Hip Abduction with Resistance at Ankles and Counter Support  - 1 x daily - 3-4 x weekly - 3 sets - 10 reps - Standing Hip Extension with Resistance at Ankles and Counter Support  - 1 x daily - 3-4 x weekly - 3 sets - 10 reps - Prone Knee Extension with Ankle Weight  - 2 x daily - 7 x weekly - 1 sets - 1 reps - up to 5 min hold - Prone Quadriceps Stretch with Strap (Mirrored)  - 2-3 x daily - 7 x weekly - 1 sets - 3 reps - 60 sec hold - Quadricep Stretch with Chair and Counter Support  - 2-3 x daily - 7 x weekly - 1 sets - 3 reps - 60 sec hold - Forward Step Down with Counter Support at Side  - 1 x daily - 7 x weekly - 2 sets - 10  reps  ASSESSMENT:  CLINICAL IMPRESSION: Pt saw MD and he is pleased with progress and wants him to continue to work on ROM gains into flexion. He is very compliant with HEP including stretching. He is able to ascend stairs reciprocally and descend as well, but has some deviation due to knee flexion deficits. Gait is affected also and patient has to elevate hip with swing phase. PT monitored throughout session to address technique. He continues to demonstrate potential for improvement and would benefit from continued skilled therapy to address impairments.     OBJECTIVE IMPAIRMENTS: decreased activity tolerance, decreased coordination, decreased endurance, decreased mobility, difficulty walking, decreased ROM, decreased strength, hypomobility, increased edema, impaired flexibility, and pain.   ACTIVITY LIMITATIONS: sitting, standing, squatting, stairs, transfers, bathing, dressing, hygiene/grooming, and locomotion level  PARTICIPATION LIMITATIONS: cleaning, driving, community activity, occupation, and yard work  PERSONAL FACTORS: 1-2 comorbidities: Lt TKA, lung cancer   are also affecting patient's functional outcome.   REHAB POTENTIAL: Good  CLINICAL DECISION MAKING: Stable/uncomplicated  EVALUATION COMPLEXITY: Low   GOALS: Goals reviewed with patient? Yes  SHORT TERM GOALS: Target date: 06/18/2023   Be  independent in initial HEP Baseline: Goal status: MET  2.  Demonstrate Lt knee A/ROM flexion to > or = to 95 degrees to allow for sitting and negotiating steps without compensation  Baseline: 65 Goal status: IN PROGRESS  3.  Improve FOTO to > or = to 50  Baseline: 44 Goal status: MET 69 on 1/27  4.   Improve strength to ascend steps with step-over step gait with use of rail to facilitate return to work Baseline: limited by ROM deficits (06/02/23) Goal status: MET  5.  Stand and walk > or = to 1 hour without limitation due to pain or fatigue  Baseline: 30 min (06/02/23) Goal  status: IN PROGRESS (hasn't tried more than 20 min but thinks he could)   LONG TERM GOALS: Target date: 07/16/2023    Be independent in advanced HEP Baseline:  Goal status: INITIAL  2.  Improve FOTO to > or = to 75 Baseline: 44 Goal status: MET 1/27  3.  Demonstrate Lt knee A/ROM flexion to > or = to 110 degrees to allow steps and squatting without compensation Baseline: 65 Goal status: INITIAL  4.  Demonstrate symmetry with ambulation on level surface due to increased strength and ROM Baseline:  Goal status: INITIAL  5.  Improve strength to negotiate steps with step-over step gait with use of rail to facilitate return to work Baseline:  Goal status: INITIAL  6.  Stand and walk > or = to 1.5 hours without limitation due to pain or fatigue  Baseline:  Goal status: INITIAL   PLAN:  PT FREQUENCY: 2x/week  PT DURATION: 8 weeks  PLANNED INTERVENTIONS: 97110-Therapeutic exercises, 97530- Therapeutic activity, 97112- Neuromuscular re-education, 97535- Self Care, 16109- Manual therapy, [id number]- Gait training, [phone]- Aquatic Therapy, 97014- Electrical stimulation (unattended), [id number]- Electrical stimulation (manual), 97016- Vasopneumatic device, 97035- Ultrasound, Patient/Family education, Balance training, Taping, Joint mobilization, Joint manipulation, Scar mobilization, Vestibular training, and Cryotherapy  PLAN FOR NEXT SESSION: Lt knee ROM, strength, gait, manual for ROM gains   Lorrene Reid, PT 06/11/23 10:59 AM  [facility] [address], [address] [place], [place] [address] Phone # [phone] Fax [phone]

## 2023-06-15 NOTE — Therapy (Signed)
OUTPATIENT PHYSICAL THERAPY LOWER EXTREMITY TREATMENT AND MD NOTE   Patient Name: Jesse Stewart MRN: [id number] DOB:15-May-1965, 58 y.o., male Today's Date: 06/16/2023  END OF SESSION:  PT End of Session - 06/16/23 1018     Visit Number 11    Date for PT Re-Evaluation 07/16/23    Authorization Type [facility]-15 visits approved 12/20-4/19/25    Authorization - Visit Number 11    Authorization - Number of Visits 15    PT Start Time 1018    PT Stop Time 1059    PT Time Calculation (min) 41 min    Activity Tolerance Patient tolerated treatment well    Behavior During Therapy [facility] for tasks assessed/performed               Past Medical History:  Diagnosis Date   Arthritis    Hypertension    Lung cancer (HCC) 03/13/2022   Past Surgical History:  Procedure Laterality Date   KNEE ARTHROSCOPY W/ MENISCAL REPAIR     LUNG REMOVAL, PARTIAL Right 03/27/2022   TOTAL KNEE ARTHROPLASTY Left 04/29/2023   Patient Active Problem List   Diagnosis Date Noted   Colon polyps 05/16/2019   Obesity (BMI 30.0-34.9) 05/13/2019   Chest tightness 10/06/2014   COVID 10/06/2014   Hematochezia 12/07/2012   Hernia of abdominal wall 12/07/2012   Well adult exam 04/23/2012   Neoplasm of uncertain behavior of skin 05/04/2010   ACTINIC KERATOSIS 05/04/2010   Dyslipidemia 04/04/2008   GERD 04/04/2008   Osteoarthritis 04/04/2008   KNEE PAIN 04/04/2008   ABNORMAL GLUCOSE NEC 04/04/2008   ABNORMAL LIVER FUNCTION TESTS 04/04/2008   Essential hypertension 12/29/2006    PCP: Tresa Garter, MD   REFERRING PROVIDER: Patrina Levering, PA  REFERRING DIAG: Left TKA 04/29/2023   THERAPY DIAG:  Acute pain of left knee  Localized edema  Other abnormalities of gait and mobility  Muscle weakness (generalized)  Rationale for Evaluation and Treatment: Rehabilitation  ONSET DATE: surgery 04/29/23  SUBJECTIVE:   SUBJECTIVE STATEMENT: I've been walking 1.5 to 2 miles everyday and [place] so a little uneven.   PERTINENT HISTORY: Lt TKA 04/29/23, Lung cancer with lung resection 03/2022   PAIN: 06/11/23:  Are you having pain? Yes: NPRS scale: 2/10 (just stiff) Pain location: Lt knee  Pain description: throbbing  Aggravating factors: walking, standing  Relieving factors: ice, pain medication, rest   PRECAUTIONS: Other: history of lung cancer   RED FLAGS: None   WEIGHT BEARING RESTRICTIONS: No  FALLS:  Has patient fallen in last 6 months? 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  PATIENT SURVEYS:  05/21/23: FOTO 44 (goal is 75)  COGNITION: Overall cognitive status: Within functional limits for tasks assessed     SENSATION: WFL  EDEMA:  Circumferential: Lt midpatellar 16.5 inches, Rt 15 inches    POSTURE: No Significant postural limitations  PALPATION: Healing surgical incision with steri strip like covering over distal aspect.  One area over proximal aspect that looks like it might open.  PT educated pt and his wife when to seek medical attention.  LOWER EXTREMITY ROM:  Active ROM A/P Right eval Left eval Left 05/26/23 Left 122/25 Left 06/09/23  Hip flexion Full ROM Full hip ROM     Hip extension       Hip abduction       Hip adduction       Hip internal rotation       Hip external rotation       Knee flexion  65 73 passive 80 passive 83 A /87 P  Knee extension  10 -3/0  0  Ankle dorsiflexion       Ankle  plantarflexion       Ankle inversion       Ankle eversion        (Blank rows = not tested)  LOWER EXTREMITY MMT:  MMT Right eval Left eval Left 06/09/23  Hip flexion 5/5 throughout  4- 5  Hip extension  4- 5  Hip abduction     Hip adduction     Hip internal rotation     Hip external rotation     Knee flexion  4 4+  Knee extension  4- 5  Ankle dorsiflexion  5   Ankle plantarflexion     Ankle inversion     Ankle eversion      (Blank rows = not tested)   GAIT: Distance walked: 50 Assistive device utilized: Single point cane Level of assistance: Complete Independence Comments: reduced Lt knee flexion with swing through.  Pelvic compensation with ascending and descending steps due to limited Lt knee flexion                                                                                                                   TREATMENT DATE:  06/16/23 Manual Therapy: to improve mobility  Patellar mobs focusing on superior glide Mulligan strap mobs with Passive into flexion  There Activities: to increase function with ADLS by increasing ROM Nustep x 6 min Leg press: 110# bil seat 6 3x10, Lt only 75# 3x10 Sit to stand x 2 focus on shifting left   Therex: Standing knee flexion stretch on 14 in step Seated HS curl on fitter one blue x 30 Seated HS curl blue band 2x 10 Prone HS curl blue band x 10, B HS curl with blue band 2x10  Gait Step down full step down x 20 Up/down stairs x 6   06/11/23 Manual:  Patellar mobs focusing on superior glide Mulligan strap mobs with Passive into flexion  Therex: Bike for ROM with prolonged hold x8 min Standing HS stretch 2x30 sec on second step Slider into flexion in sitting x20 Standing knee flexion stretch on 14 in step Step down full step down x 20 Up/down stairs x 6 Leg press: 90# bil seat 6 3x10, Lt only 50# 3x10 Standing quad stretch with Left lower leg on chair Seated HS curl on fitter one blue x 20 Seated HS curl blue band x  10 Prone HS curl blue band x 10  06/09/23  Manual:  Patellar mobs focusing on superior glide ROM/MMT/goals assessed  Therex: Nustep L 3 x 7 min for ROM Standing HS stretch 2x30 sec on second step Slider into flexion in sitting x20 Standing knee flexion stretch on 14 in step Step down full step down x 20 Up/down stairs x 6 Leg press: 90# bil seat 6 3x10, Lt only 50# 3x10 standing quad stretch with Left lower leg on chair Seated HS curl on fitter one blue x 20 Seated HS curl blue band x 10 Prone HS curl blue band x 10   06/06/23 Manual:  Patellar mobs focusing on superior glide  Therex: Bike L0 x 7 min for ROM Slider into flexion in sitting x20 Standing knee flexion stretch on 14 in step Step down from bottom step to 2 inch step x 10, then full step down x 20 Leg press: 90# bil seat 6 2x10, Lt only 50# 2x10 Standing quad stretch with Left lower leg on chair Seated HS curl on fitter one blue x 20 Seated HS curl red band x 5 for demo to use at home Also recommended prone HS curl  Gait Stepping forward over hurdles to promote knee flexion with swing phase of gait- leading with both Rt and Lt   PATIENT EDUCATION:  Education details: Access Code: [email or web address] Person educated: Patient Education method: Explanation, Demonstration, and Handouts Education comprehension: verbalized understanding and returned demonstration  HOME EXERCISE PROGRAM: Access Code: [email or web address] URL: [email or web address] Date: 06/04/2023 Prepared by: Raynelle Fanning  Exercises - Seated Knee Flexion AAROM  - 3-5 x daily - 7 x weekly - 1 sets - 10 reps - 5-10 hold - Standing Knee Flexion Stretch on Step  - 3-5 x daily - 7 x weekly - 1 sets - 10 reps - 5-10 hold - Supine Quad Set  - 2 x daily - 7 x weekly - 1 sets - 10 reps - 5 hold - Supine Heel Slide with Strap  - 2 x daily - 7 x weekly - 1-2 sets - 10 reps - Standing Hip Abduction with Resistance at Ankles and Counter Support  - 1 x daily - 3-4 x  weekly - 3 sets - 10 reps - Standing Hip Extension with Resistance at Ankles and Counter Support  - 1 x daily - 3-4 x weekly - 3 sets - 10 reps - Prone Knee Extension with Ankle Weight  - 2 x daily - 7 x weekly - 1 sets - 1 reps - up to 5 min hold - Prone Quadriceps Stretch with Strap (Mirrored)  - 2-3 x daily - 7 x weekly - 1 sets - 3 reps - 60 sec hold - Quadricep Stretch with Chair and Counter Support  - 2-3 x daily - 7 x weekly - 1 sets - 3 reps - 60 sec hold - Forward Step Down with Counter Support at Side  - 1 x daily - 7 x weekly - 2 sets - 10 reps  ASSESSMENT:  CLINICAL IMPRESSION:  Pt reports increased walking endurance since last visit. He mentioned having a coupe of instances of the knee feeling like it was hyperextenging when walking on the beach. Measures to 80 deg passively on stairs today. Ease of ROM improves as repetitions improve.    OBJECTIVE IMPAIRMENTS: decreased activity tolerance, decreased coordination, decreased endurance, decreased mobility, difficulty walking, decreased ROM, decreased strength, hypomobility, increased edema, impaired flexibility, and pain.   ACTIVITY LIMITATIONS: sitting, standing, squatting,  stairs, transfers, bathing, dressing, hygiene/grooming, and locomotion level  PARTICIPATION LIMITATIONS: cleaning, driving, community activity, occupation, and yard work  PERSONAL FACTORS: 1-2 comorbidities: Lt TKA, lung cancer   are also affecting patient's functional outcome.   REHAB POTENTIAL: Good  CLINICAL DECISION MAKING: Stable/uncomplicated  EVALUATION COMPLEXITY: Low   GOALS: Goals reviewed with patient? Yes  SHORT TERM GOALS: Target date: 06/18/2023   Be independent in initial HEP Baseline: Goal status: MET  2.  Demonstrate Lt knee A/ROM flexion to > or = to 95 degrees to allow for sitting and negotiating steps without compensation  Baseline: 65 Goal status: IN PROGRESS  3.  Improve FOTO to > or = to 50  Baseline: 44 Goal status: MET  69 on 1/27  4.   Improve strength to ascend steps with step-over step gait with use of rail to facilitate return to work Baseline: limited by ROM deficits (06/02/23) Goal status: MET  5.  Stand and walk > or = to 1 hour without limitation due to pain or fatigue  Baseline: 30 min (06/02/23) Goal status: IN PROGRESS (hasn't tried more than 20 min but thinks he could)   LONG TERM GOALS: Target date: 07/16/2023    Be independent in advanced HEP Baseline:  Goal status: INITIAL  2.  Improve FOTO to > or = to 75 Baseline: 44 Goal status: MET 1/27  3.  Demonstrate Lt knee A/ROM flexion to > or = to 110 degrees to allow steps and squatting without compensation Baseline: 65 Goal status: INITIAL  4.  Demonstrate symmetry with ambulation on level surface due to increased strength and ROM Baseline:  Goal status: INITIAL  5.  Improve strength to negotiate steps with step-over step gait with use of rail to facilitate return to work Baseline:  Goal status: INITIAL  6.  Stand and walk > or = to 1.5 hours without limitation due to pain or fatigue  Baseline:  Goal status: INITIAL   PLAN:  PT FREQUENCY: 2x/week  PT DURATION: 8 weeks  PLANNED INTERVENTIONS: 97110-Therapeutic exercises, 97530- Therapeutic activity, 97112- Neuromuscular re-education, 97535- Self Care, 40981- Manual therapy, [phone]- Gait training, [phone]- Aquatic Therapy, 97014- Electrical stimulation (unattended), [id number]- Electrical stimulation (manual), 97016- Vasopneumatic device, 97035- Ultrasound, Patient/Family education, Balance training, Taping, Joint mobilization, Joint manipulation, Scar mobilization, Vestibular training, and Cryotherapy  PLAN FOR NEXT SESSION: increase to 8 inch steps, sit to stands low table  Solon Palm, PT  06/16/23 11:03 AM  [facility] [address] [place], [place] [address] Phone # [phone] Fax [phone]

## 2023-06-16 ENCOUNTER — Encounter: Payer: Self-pay | Admitting: Physical Therapy

## 2023-06-16 ENCOUNTER — Ambulatory Visit: Payer: No Typology Code available for payment source | Attending: Physician Assistant | Admitting: Physical Therapy

## 2023-06-16 DIAGNOSIS — R600 Localized edema: Secondary | ICD-10-CM | POA: Diagnosis present

## 2023-06-16 DIAGNOSIS — R2689 Other abnormalities of gait and mobility: Secondary | ICD-10-CM | POA: Diagnosis present

## 2023-06-16 DIAGNOSIS — M6281 Muscle weakness (generalized): Secondary | ICD-10-CM | POA: Diagnosis present

## 2023-06-16 DIAGNOSIS — M25562 Pain in left knee: Secondary | ICD-10-CM | POA: Diagnosis present

## 2023-06-19 NOTE — Therapy (Signed)
 OUTPATIENT PHYSICAL THERAPY LOWER EXTREMITY TREATMENT   Patient Name: Jesse Stewart MRN: [id number] DOB:10-14-1965, 58 y.o., male Today's Date: 06/20/2023  END OF SESSION:  PT End of Session - 06/20/23 1120     Visit Number 12    Date for PT Re-Evaluation 07/16/23    Authorization Type [facility]-15 visits approved 12/20-4/19/25    Authorization - Visit Number 12    Authorization - Number of Visits 15    PT Start Time 1021    PT Stop Time 1103    PT Time Calculation (min) 42 min    Activity Tolerance Patient tolerated treatment well    Behavior During Therapy [facility] for tasks assessed/performed                Past Medical History:  Diagnosis Date   Arthritis    Hypertension    Lung cancer (HCC) 03/13/2022   Past Surgical History:  Procedure Laterality Date   KNEE ARTHROSCOPY W/ MENISCAL REPAIR     LUNG REMOVAL, PARTIAL Right 03/27/2022   TOTAL KNEE ARTHROPLASTY Left 04/29/2023   Patient Active Problem List   Diagnosis Date Noted   Colon polyps 05/16/2019   Obesity (BMI 30.0-34.9) 05/13/2019   Chest tightness 10/06/2014   COVID 10/06/2014   Hematochezia 12/07/2012   Hernia of abdominal wall 12/07/2012   Well adult exam 04/23/2012   Neoplasm of uncertain behavior of skin 05/04/2010   ACTINIC KERATOSIS 05/04/2010   Dyslipidemia 04/04/2008   GERD 04/04/2008   Osteoarthritis 04/04/2008   KNEE PAIN 04/04/2008   ABNORMAL GLUCOSE NEC 04/04/2008   ABNORMAL LIVER FUNCTION TESTS 04/04/2008   Essential hypertension 12/29/2006    PCP: Garald Karlynn GAILS, MD   REFERRING PROVIDER: Sedalia Carrier, PA  REFERRING DIAG: Left TKA 04/29/2023   THERAPY DIAG:  Acute pain of left knee  Localized edema  Other abnormalities of gait and mobility  Muscle weakness (generalized)  Rationale for Evaluation and Treatment: Rehabilitation  ONSET DATE: surgery 04/29/23  SUBJECTIVE:   SUBJECTIVE STATEMENT: Nothing new to report   PERTINENT HISTORY: Lt TKA 04/29/23,  Lung cancer with lung resection 03/2022   PAIN: 06/11/23:  Are you having pain? Yes: NPRS scale: 2/10 (just stiff) Pain location: Lt knee  Pain description: throbbing  Aggravating factors: walking, standing  Relieving factors: ice, pain medication, rest   PRECAUTIONS: Other: history of lung cancer   RED FLAGS: None   WEIGHT BEARING RESTRICTIONS: No  FALLS:  Has patient fallen in last 6 months? 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  PATIENT SURVEYS:  05/21/23: FOTO 44 (goal is 75)  COGNITION: Overall cognitive status: Within functional limits for tasks assessed     SENSATION: WFL  EDEMA:  Circumferential: Lt midpatellar 16.5 inches, Rt 15 inches    POSTURE: No Significant postural limitations  PALPATION: Healing surgical incision with steri strip like covering over distal aspect.  One area over proximal aspect that looks like it might open.  PT educated pt and his wife when to seek medical attention.  LOWER  EXTREMITY ROM:  Active ROM A/P Right eval Left eval Left 05/26/23 Left 122/25 Left 06/09/23  Hip flexion Full ROM Full hip ROM     Hip extension       Hip abduction       Hip adduction       Hip internal rotation       Hip external rotation       Knee flexion  65 73 passive 80 passive 83 A /87 P  Knee extension  10 -3/0  0  Ankle dorsiflexion       Ankle plantarflexion       Ankle inversion       Ankle eversion         (Blank rows = not tested)  LOWER EXTREMITY MMT:  MMT Right eval Left eval Left 06/09/23  Hip flexion 5/5 throughout  4- 5  Hip extension  4- 5  Hip abduction     Hip adduction     Hip internal rotation     Hip external rotation     Knee flexion  4 4+  Knee extension  4- 5  Ankle dorsiflexion  5   Ankle plantarflexion     Ankle inversion     Ankle eversion      (Blank rows = not tested)   GAIT: Distance walked: 50 Assistive device utilized: Single point cane Level of assistance: Complete Independence Comments: reduced Lt knee flexion with swing through.  Pelvic compensation with ascending and descending steps due to limited Lt knee flexion                                                                                                                   TREATMENT DATE:   06/20/23 Manual Therapy: to improve mobility  Patellar mobs focusing on superior glide IASTM to distal quads and along incision in areas where pt reports tightness Mulligan strap mobs with Passive into flexion  There Activities: to increase function with ADLS by increasing ROM Leg press: 90# x 30 for warm up, 110# bil seat 6 3x10, Lt only 75# 3x10 Sit to stand low table x  focus on shifting left  x 10 Sit to stand x 20 with R foot on 2 inch step to bias L LE and using mirror for visual cues Step up full step down x 20 6 and 8 inch steps  Therex: Bike x 5 min partial revolutions working on increasing knee flexion Standing hip flex/ext, circles and diagonals on hip machine to try to relax muscles Standing knee flexion stretch on 14 in step  Self Care: discussion of POC for knee and upcoming shoulder   06/16/23 Manual Therapy: to improve mobility  Patellar mobs focusing on superior glide Mulligan strap mobs with Passive into flexion  There Activities: to increase function with ADLS by increasing ROM Nustep x 6 min Leg press: 110# bil seat 6 3x10, Lt only 75# 3x10 Sit to stand x 2 focus on shifting  left   Therex: Standing knee  flexion stretch on 14 in step Seated HS curl on fitter one blue x 30 Seated HS curl blue band 2x 10 Prone HS curl blue band x 10, B HS curl with blue band 2x10  Gait Step down full step down x 20 Up/down stairs x 6   06/11/23 Manual:  Patellar mobs focusing on superior glide Mulligan strap mobs with Passive into flexion  Therex: Bike for ROM with prolonged hold x8 min Standing HS stretch 2x30 sec on second step Slider into flexion in sitting x20 Standing knee flexion stretch on 14 in step Step down full step down x 20 Up/down stairs x 6 Leg press: 90# bil seat 6 3x10, Lt only 50# 3x10 Standing quad stretch with Left lower leg on chair Seated HS curl on fitter one blue x 20 Seated HS curl blue band x 10 Prone HS curl blue band x 10  06/09/23  Manual:  Patellar mobs focusing on superior glide ROM/MMT/goals assessed  Therex: Nustep L 3 x 7 min for ROM Standing HS stretch 2x30 sec on second step Slider into flexion in sitting x20 Standing knee flexion stretch on 14 in step Step down full step down x 20 Up/down stairs x 6 Leg press: 90# bil seat 6 3x10, Lt only 50# 3x10 standing quad stretch with Left lower leg on chair Seated HS curl on fitter one blue x 20 Seated HS curl blue band x 10 Prone HS curl blue band x 10   06/06/23 Manual:  Patellar mobs focusing on superior glide  Therex: Bike L0 x 7 min for ROM Slider into flexion in sitting x20 Standing knee flexion stretch on 14 in step Step down from bottom step to 2 inch step x 10, then full step down x 20 Leg press: 90# bil seat 6 2x10, Lt only 50# 2x10 Standing quad stretch with Left lower leg on chair Seated HS curl on fitter one blue x 20 Seated HS curl red band x 5 for demo to use at home Also recommended prone HS curl  Gait Stepping forward over hurdles to promote knee flexion with swing phase of gait- leading with both Rt and Lt   PATIENT EDUCATION:  Education  details: Access Code: [email or web address] Person educated: Patient Education method: Explanation, Demonstration, and Handouts Education comprehension: verbalized understanding and returned demonstration  HOME EXERCISE PROGRAM: Access Code: [email or web address] URL: [email or web address] Date: 06/04/2023 Prepared by: Mliss  Exercises - Seated Knee Flexion AAROM  - 3-5 x daily - 7 x weekly - 1 sets - 10 reps - 5-10 hold - Standing Knee Flexion Stretch on Step  - 3-5 x daily - 7 x weekly - 1 sets - 10 reps - 5-10 hold - Supine Quad Set  - 2 x daily - 7 x weekly - 1 sets - 10 reps - 5 hold - Supine Heel Slide with Strap  - 2 x daily - 7 x weekly - 1-2 sets - 10 reps - Standing Hip Abduction with Resistance at Ankles and Counter Support  - 1 x daily - 3-4 x weekly - 3 sets - 10 reps - Standing Hip Extension with Resistance at Ankles and Counter Support  - 1 x daily - 3-4 x weekly - 3 sets - 10 reps - Prone Knee Extension with Ankle Weight  - 2 x daily - 7 x weekly - 1 sets - 1 reps - up to 5 min hold - Prone Quadriceps Stretch with Strap (Mirrored)  - 2-3 x daily - 7 x  weekly - 1 sets - 3 reps - 60 sec hold - Quadricep Stretch with Chair and Counter Support  - 2-3 x daily - 7 x weekly - 1 sets - 3 reps - 60 sec hold - Forward Step Down with Counter Support at Side  - 1 x daily - 7 x weekly - 2 sets - 10 reps  ASSESSMENT:  CLINICAL IMPRESSION:  Pt continues to be restricted with L knee flexion. Passively he got to 80 deg today, but that was after considerable stretching and Mulligan mobs. He continues to be diligent with his HEP, but reports he'll sit down for 30 minutes and then the knee is back to being tight again. Walking endurance is increasing to 1.5 miles. He is still shifting R with sit <> stands, so mirror used today for cues. We discussed going forward with shoulder evaluation but keeping his knee episode open for intermittent assessment and treatment (mainly manual) as needed. He does report  that manual therapy helps some with the stiffness.    OBJECTIVE IMPAIRMENTS: decreased activity tolerance, decreased coordination, decreased endurance, decreased mobility, difficulty walking, decreased ROM, decreased strength, hypomobility, increased edema, impaired flexibility, and pain.   ACTIVITY LIMITATIONS: sitting, standing, squatting, stairs, transfers, bathing, dressing, hygiene/grooming, and locomotion level  PARTICIPATION LIMITATIONS: cleaning, driving, community activity, occupation, and yard work  PERSONAL FACTORS: 1-2 comorbidities: Lt TKA, lung cancer   are also affecting patient's functional outcome.   REHAB POTENTIAL: Good  CLINICAL DECISION MAKING: Stable/uncomplicated  EVALUATION COMPLEXITY: Low   GOALS: Goals reviewed with patient? Yes  SHORT TERM GOALS: Target date: 06/18/2023   Be independent in initial HEP Baseline: Goal status: MET  2.  Demonstrate Lt knee A/ROM flexion to > or = to 95 degrees to allow for sitting and negotiating steps without compensation  Baseline: 65 Goal status: IN PROGRESS  3.  Improve FOTO to > or = to 50  Baseline: 44 Goal status: MET 69 on 1/27  4.   Improve strength to ascend steps with step-over step gait with use of rail to facilitate return to work Baseline: limited by ROM deficits (06/02/23) Goal status: MET  5.  Stand and walk > or = to 1 hour without limitation due to pain or fatigue  Baseline: 30 min (06/02/23) Goal status: IN PROGRESS (hasn't tried more than 20 min but thinks he could)   LONG TERM GOALS: Target date: 07/16/2023    Be independent in advanced HEP Baseline:  Goal status: INITIAL  2.  Improve FOTO to > or = to 75 Baseline: 44 Goal status: MET 1/27  3.  Demonstrate Lt knee A/ROM flexion to > or = to 110 degrees to allow steps and squatting without compensation Baseline: 65 Goal status: INITIAL  4.  Demonstrate symmetry with ambulation on level surface due to increased strength and ROM Baseline:   Goal status: INITIAL  5.  Improve strength to negotiate steps with step-over step gait with use of rail to facilitate return to work Baseline:  Goal status: INITIAL  6.  Stand and walk > or = to 1.5 hours without limitation due to pain or fatigue  Baseline:  Goal status: INITIAL   PLAN:  PT FREQUENCY: 2x/week  PT DURATION: 8 weeks  PLANNED INTERVENTIONS: 97110-Therapeutic exercises, 97530- Therapeutic activity, [id number]- Neuromuscular re-education, 97535- Self Care, [id number]- Manual therapy, [id number]- Gait training, [phone]- Aquatic Therapy, 97014- Electrical stimulation (unattended), [id number]- Electrical stimulation (manual), 97016- Vasopneumatic device, [id number]- Ultrasound, Patient/Family education, Balance training, Taping, Joint mobilization, Joint manipulation,  Scar mobilization, Vestibular training, and Cryotherapy  PLAN FOR NEXT SESSION: continue working on knee flexion; eval shoulder once referral is in   [place], PT  06/20/23 11:23 AM  [facility] [address], [address] [place], [place] [address] Phone # [phone] Fax [phone]

## 2023-06-20 ENCOUNTER — Encounter: Payer: Self-pay | Admitting: Physical Therapy

## 2023-06-20 ENCOUNTER — Ambulatory Visit: Payer: No Typology Code available for payment source | Admitting: Physical Therapy

## 2023-06-20 DIAGNOSIS — R600 Localized edema: Secondary | ICD-10-CM

## 2023-06-20 DIAGNOSIS — M25562 Pain in left knee: Secondary | ICD-10-CM

## 2023-06-20 DIAGNOSIS — R2689 Other abnormalities of gait and mobility: Secondary | ICD-10-CM

## 2023-06-20 DIAGNOSIS — M6281 Muscle weakness (generalized): Secondary | ICD-10-CM

## 2023-06-23 ENCOUNTER — Ambulatory Visit: Payer: No Typology Code available for payment source

## 2023-06-23 DIAGNOSIS — M25562 Pain in left knee: Secondary | ICD-10-CM | POA: Diagnosis not present

## 2023-06-23 DIAGNOSIS — R2689 Other abnormalities of gait and mobility: Secondary | ICD-10-CM

## 2023-06-23 DIAGNOSIS — R600 Localized edema: Secondary | ICD-10-CM

## 2023-06-23 DIAGNOSIS — M6281 Muscle weakness (generalized): Secondary | ICD-10-CM

## 2023-06-23 NOTE — Therapy (Signed)
 OUTPATIENT PHYSICAL THERAPY LOWER EXTREMITY TREATMENT   Patient Name: Jesse Stewart MRN: [id number] DOB:1966-02-01, 58 y.o., male Today's Date: 06/23/2023  END OF SESSION:  PT End of Session - 06/23/23 1029     Visit Number 13    Date for PT Re-Evaluation 07/16/23    Authorization Type [facility] visits approved 12/20-4/19/25    Authorization - Visit Number 13    Authorization - Number of Visits 15    PT Start Time 1017    PT Stop Time 1100    PT Time Calculation (min) 43 min    Activity Tolerance Patient tolerated treatment well    Behavior During Therapy [facility] for tasks assessed/performed                 Past Medical History:  Diagnosis Date   Arthritis    Hypertension    Lung cancer (HCC) 03/13/2022   Past Surgical History:  Procedure Laterality Date   KNEE ARTHROSCOPY W/ MENISCAL REPAIR     LUNG REMOVAL, PARTIAL Right 03/27/2022   TOTAL KNEE ARTHROPLASTY Left 04/29/2023   Patient Active Problem List   Diagnosis Date Noted   Colon polyps 05/16/2019   Obesity (BMI 30.0-34.9) 05/13/2019   Chest tightness 10/06/2014   COVID 10/06/2014   Hematochezia 12/07/2012   Hernia of abdominal wall 12/07/2012   Well adult exam 04/23/2012   Neoplasm of uncertain behavior of skin 05/04/2010   ACTINIC KERATOSIS 05/04/2010   Dyslipidemia 04/04/2008   GERD 04/04/2008   Osteoarthritis 04/04/2008   KNEE PAIN 04/04/2008   ABNORMAL GLUCOSE NEC 04/04/2008   ABNORMAL LIVER FUNCTION TESTS 04/04/2008   Essential hypertension 12/29/2006    PCP: Genia Kettering, MD   REFERRING PROVIDER: Jannett Mems, PA  REFERRING DIAG: Left TKA 04/29/2023   THERAPY DIAG:  Acute pain of left knee  Localized edema  Other abnormalities of gait and mobility  Muscle weakness (generalized)  Rationale for Evaluation and Treatment: Rehabilitation  ONSET DATE: surgery 04/29/23  SUBJECTIVE:   SUBJECTIVE STATEMENT: I've been walking a lot, 1.5 miles   PERTINENT  HISTORY: Lt TKA 04/29/23, Lung cancer with lung resection 03/2022   PAIN: 06/11/23:  Are you having pain? Yes: NPRS scale: 2/10 (just stiff) Pain location: Lt knee  Pain description: throbbing  Aggravating factors: walking, standing  Relieving factors: ice, pain medication, rest   PRECAUTIONS: Other: history of lung cancer   RED FLAGS: None   WEIGHT BEARING RESTRICTIONS: No  FALLS:  Has patient fallen in last 6 months? 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  PATIENT SURVEYS:  05/21/23: FOTO 44 (goal is 75)  COGNITION: Overall cognitive status: Within functional limits for tasks assessed     SENSATION: WFL  EDEMA:  Circumferential: Lt midpatellar 16.5 inches, Rt 15 inches    POSTURE: No Significant postural limitations  PALPATION: Healing surgical incision with steri strip like covering over distal aspect.  One area over proximal aspect that looks like it might open.  PT educated pt and his wife when to seek  medical attention.  LOWER EXTREMITY ROM:  Active ROM A/P Right eval Left eval Left 05/26/23 Left 122/25 Left 06/09/23  Hip flexion Full ROM Full hip ROM     Hip extension       Hip abduction       Hip adduction       Hip internal rotation       Hip external rotation       Knee flexion  65 73 passive 80 passive 83 A /87 P  Knee extension  10 -3/0  0  Ankle dorsiflexion       Ankle plantarflexion       Ankle inversion        Ankle eversion        (Blank rows = not tested)  LOWER EXTREMITY MMT:  MMT Right eval Left eval Left 06/09/23  Hip flexion 5/5 throughout  4- 5  Hip extension  4- 5  Hip abduction     Hip adduction     Hip internal rotation     Hip external rotation     Knee flexion  4 4+  Knee extension  4- 5  Ankle dorsiflexion  5   Ankle plantarflexion     Ankle inversion     Ankle eversion      (Blank rows = not tested)   GAIT: Distance walked: 50 Assistive device utilized: Single point cane Level of assistance: Complete Independence Comments: reduced Lt knee flexion with swing through.  Pelvic compensation with ascending and descending steps due to limited Lt knee flexion                                                                                                                   TREATMENT DATE:   06/23/23 Manual Therapy: to improve mobility  Patellar mobs focusing on superior glide Prone Lt knee flexion with tissue mobilization  There Activities: to increase function with ADLS by increasing ROM Leg press: 90# x 30 for warm up, 110# bil seat 6 3x10, Lt only 75# 3x10 Sit to stand low table x  focus on shifting left  x 10  Therex/Neuro re-ed: Bike x 5 min partial revolutions working on increasing knee flexion Resisted walking: 15#- 4 ways x 8 each Standing knee flexion stretch on 14 in step Step-up on Bosu ball: Lt x 25  06/20/23 Manual Therapy: to improve mobility  Patellar mobs focusing on superior glide IASTM to distal quads and along incision in areas where pt reports tightness Mulligan strap mobs with Passive into flexion   There Activities: to increase function with ADLS by increasing ROM Leg press: 90# x 30 for warm up, 110# bil seat 6 3x10, Lt only 75# 3x10 Sit to stand low table x  focus on shifting left  x 10 Sit to stand x 20 with R foot on 2 inch step to bias L LE and using mirror for visual cues Step up full step down x 20 6 and 8 inch steps    Therex:  Bike x 5 min partial revolutions working on increasing knee flexion Standing hip flex/ext, circles and diagonals on hip machine to try to relax muscles Standing knee flexion stretch on 14 in step   Self Care: discussion of POC for knee and upcoming shoulder   06/16/23 Manual Therapy: to improve mobility  Patellar mobs focusing on superior glide Mulligan strap mobs with Passive into flexion  There Activities: to increase function with ADLS by increasing ROM Nustep x 6 min Leg press: 110# bil seat 6 3x10, Lt only 75# 3x10 Sit to stand x 2 focus on shifting left   Therex: Standing knee flexion stretch on 14 in step Seated HS curl on fitter one blue x 30 Seated HS curl blue band 2x 10 Prone HS curl blue band x 10, B HS curl with blue band 2x10  Gait Step down full step down x 20 Up/down stairs x 6   PATIENT EDUCATION:  Education details: Access Code: [email or web address] Person educated: Patient Education method: Explanation, Demonstration, and Handouts Education comprehension: verbalized understanding and returned demonstration  HOME EXERCISE PROGRAM: Access Code: [email or web address] URL: [email or web address] Date: 06/04/2023 Prepared by: Concha Deed  Exercises - Seated Knee Flexion AAROM  - 3-5 x daily - 7 x weekly - 1 sets - 10 reps - 5-10 hold - Standing Knee Flexion Stretch on Step  - 3-5 x daily - 7 x weekly - 1 sets - 10 reps - 5-10 hold - Supine Quad Set  - 2 x daily - 7 x weekly - 1 sets - 10 reps - 5 hold - Supine Heel Slide with Strap  - 2 x daily - 7 x weekly - 1-2 sets - 10 reps - Standing Hip Abduction with Resistance at Ankles and Counter Support  - 1 x daily - 3-4 x weekly - 3 sets - 10 reps - Standing Hip Extension with Resistance at Ankles and Counter Support  - 1 x daily - 3-4 x weekly - 3 sets - 10 reps - Prone Knee Extension with Ankle Weight  - 2 x daily - 7 x weekly - 1 sets - 1 reps - up to 5 min hold - Prone Quadriceps Stretch with Strap (Mirrored)  -  2-3 x daily - 7 x weekly - 1 sets - 3 reps - 60 sec hold - Quadricep Stretch with Chair and Counter Support  - 2-3 x daily - 7 x weekly - 1 sets - 3 reps - 60 sec hold - Forward Step Down with Counter Support at Side  - 1 x daily - 7 x weekly - 2 sets - 10 reps  ASSESSMENT:  CLINICAL IMPRESSION: Pt continues to add walking and mobility at home and is now walking 1.5 miles at a time.  Limitations remain associated with limited Lt knee flexion. He continues to work on flexion at home.  He did well with resisted walking and demonstrated good stability.  He has order for his Lt shoulder and we will evaluate next week after he has MRI of this on 06/27/23.  Patient will benefit from skilled PT to address the below impairments and improve overall function.    OBJECTIVE IMPAIRMENTS: decreased activity tolerance, decreased coordination, decreased endurance, decreased mobility, difficulty walking, decreased ROM, decreased strength, hypomobility, increased edema, impaired flexibility, and pain.   ACTIVITY LIMITATIONS: sitting, standing, squatting, stairs, transfers, bathing, dressing, hygiene/grooming, and locomotion level  PARTICIPATION LIMITATIONS: cleaning, driving, community activity, occupation, and yard work  PERSONAL FACTORS: 1-2 comorbidities: Lt TKA, lung cancer  are also affecting patient's functional outcome.   REHAB POTENTIAL: Good  CLINICAL DECISION MAKING: Stable/uncomplicated  EVALUATION COMPLEXITY: Low   GOALS: Goals reviewed with patient? Yes  SHORT TERM GOALS: Target date: 06/18/2023   Be independent in initial HEP Baseline: Goal status: MET  2.  Demonstrate Lt knee A/ROM flexion to > or = to 95 degrees to allow for sitting and negotiating steps without compensation  Baseline: 65 Goal status: IN PROGRESS  3.  Improve FOTO to > or = to 50  Baseline: 44 Goal status: MET 69 on 1/27  4.   Improve strength to ascend steps with step-over step gait with use of rail to  facilitate return to work Baseline: limited by ROM deficits (06/02/23) Goal status: MET  5.  Stand and walk > or = to 1 hour without limitation due to pain or fatigue  Baseline: 30 min (06/02/23) Goal status: IN PROGRESS (hasn't tried more than 20 min but thinks he could)   LONG TERM GOALS: Target date: 07/16/2023    Be independent in advanced HEP Baseline:  Goal status: INITIAL  2.  Improve FOTO to > or = to 75 Baseline: 44 Goal status: MET 1/27  3.  Demonstrate Lt knee A/ROM flexion to > or = to 110 degrees to allow steps and squatting without compensation Baseline: 65 Goal status: INITIAL  4.  Demonstrate symmetry with ambulation on level surface due to increased strength and ROM Baseline:  Goal status: INITIAL  5.  Improve strength to negotiate steps with step-over step gait with use of rail to facilitate return to work Baseline:  Goal status: INITIAL  6.  Stand and walk > or = to 1.5 hours without limitation due to pain or fatigue  Baseline:  Goal status: INITIAL   PLAN:  PT FREQUENCY: 2x/week  PT DURATION: 8 weeks  PLANNED INTERVENTIONS: 97110-Therapeutic exercises, 97530- Therapeutic activity, 97112- Neuromuscular re-education, 97535- Self Care, 98119- Manual therapy, [phone]- Gait training, [phone]- Aquatic Therapy, 97014- Electrical stimulation (unattended), [id number]- Electrical stimulation (manual), 97016- Vasopneumatic device, 97035- Ultrasound, Patient/Family education, Balance training, Taping, Joint mobilization, Joint manipulation, Scar mobilization, Vestibular training, and Cryotherapy  PLAN FOR NEXT SESSION: continue working on knee flexion; pt has MRI of Rt shoulder on 06/27/23- we will evaluate next week   Luella Sager, PT 06/23/23 11:06 AM  [facility] [address], [address] [place], [place] [address] Phone # [phone] Fax [phone]

## 2023-06-26 ENCOUNTER — Ambulatory Visit: Payer: No Typology Code available for payment source

## 2023-06-26 DIAGNOSIS — R600 Localized edema: Secondary | ICD-10-CM

## 2023-06-26 DIAGNOSIS — M6281 Muscle weakness (generalized): Secondary | ICD-10-CM

## 2023-06-26 DIAGNOSIS — R2689 Other abnormalities of gait and mobility: Secondary | ICD-10-CM

## 2023-06-26 DIAGNOSIS — M25562 Pain in left knee: Secondary | ICD-10-CM | POA: Diagnosis not present

## 2023-06-26 NOTE — Therapy (Signed)
OUTPATIENT PHYSICAL THERAPY LOWER EXTREMITY TREATMENT   Patient Name: Jesse Stewart MRN: [id number] DOB:1965/05/26, 58 y.o., male Today's Date: 06/26/2023  END OF SESSION:  PT End of Session - 06/26/23 1039     Visit Number 14    Date for PT Re-Evaluation 07/16/23    Authorization Type [facility]-15 visits approved 12/20-4/19/25    Authorization - Visit Number 14    Authorization - Number of Visits 15    PT Start Time 1017    PT Stop Time 1058    PT Time Calculation (min) 41 min    Activity Tolerance Patient tolerated treatment well    Behavior During Therapy [facility] for tasks assessed/performed                  Past Medical History:  Diagnosis Date   Arthritis    Hypertension    Lung cancer (HCC) 03/13/2022   Past Surgical History:  Procedure Laterality Date   KNEE ARTHROSCOPY W/ MENISCAL REPAIR     LUNG REMOVAL, PARTIAL Right 03/27/2022   TOTAL KNEE ARTHROPLASTY Left 04/29/2023   Patient Active Problem List   Diagnosis Date Noted   Colon polyps 05/16/2019   Obesity (BMI 30.0-34.9) 05/13/2019   Chest tightness 10/06/2014   COVID 10/06/2014   Hematochezia 12/07/2012   Hernia of abdominal wall 12/07/2012   Well adult exam 04/23/2012   Neoplasm of uncertain behavior of skin 05/04/2010   ACTINIC KERATOSIS 05/04/2010   Dyslipidemia 04/04/2008   GERD 04/04/2008   Osteoarthritis 04/04/2008   KNEE PAIN 04/04/2008   ABNORMAL GLUCOSE NEC 04/04/2008   ABNORMAL LIVER FUNCTION TESTS 04/04/2008   Essential hypertension 12/29/2006    PCP: Tresa Garter, MD   REFERRING PROVIDER: Patrina Levering, PA  REFERRING DIAG: Left TKA 04/29/2023   THERAPY DIAG:  Acute pain of left knee  Localized edema  Other abnormalities of gait and mobility  Muscle weakness (generalized)  Rationale for Evaluation and Treatment: Rehabilitation  ONSET DATE: surgery 04/29/23  SUBJECTIVE:   SUBJECTIVE STATEMENT: I haven't been walking as much due to cold weather.   Shoulder MRI scheduled for tomorrow.   PERTINENT HISTORY: Lt TKA 04/29/23, Lung cancer with lung resection 03/2022   PAIN: 06/26/23:  Are you having pain? Yes: NPRS scale: 2/10 (just stiff) Pain location: Lt knee  Pain description: throbbing  Aggravating factors: walking, standing  Relieving factors: ice, pain medication, rest   PRECAUTIONS: Other: history of lung cancer   RED FLAGS: None   WEIGHT BEARING RESTRICTIONS: No  FALLS:  Has patient fallen in last 6 months? 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  PATIENT SURVEYS:  05/21/23: FOTO 44 (goal is 75) 06/11/23: 69  COGNITION: Overall cognitive status: Within functional limits for tasks assessed     SENSATION: WFL  EDEMA:  Circumferential: Lt midpatellar 16.5 inches, Rt 15 inches    POSTURE: No Significant postural limitations  PALPATION: Healing surgical incision with steri strip like covering over distal aspect.  One area over proximal aspect that looks like it  might open.  PT educated pt and his wife when to seek medical attention.  LOWER EXTREMITY ROM:  Active ROM A/P Right eval Left eval Left 05/26/23 Left 122/25 Left 06/09/23  Hip flexion Full ROM Full hip ROM     Hip extension       Hip abduction       Hip adduction       Hip internal rotation       Hip external rotation       Knee flexion  65 73 passive 80 passive 83 A /87 P  Knee extension  10 -3/0  0  Ankle  dorsiflexion       Ankle plantarflexion       Ankle inversion       Ankle eversion        (Blank rows = not tested)  LOWER EXTREMITY MMT:  MMT Right eval Left eval Left 06/09/23  Hip flexion 5/5 throughout  4- 5  Hip extension  4- 5  Hip abduction     Hip adduction     Hip internal rotation     Hip external rotation     Knee flexion  4 4+  Knee extension  4- 5  Ankle dorsiflexion  5   Ankle plantarflexion     Ankle inversion     Ankle eversion      (Blank rows = not tested)   GAIT: Distance walked: 50 Assistive device utilized: Single point cane Level of assistance: Complete Independence Comments: reduced Lt knee flexion with swing through.  Pelvic compensation with ascending and descending steps due to limited Lt knee flexion                                                                                                                   TREATMENT DATE:  06/26/23  There Activities: to increase function with ADLS by increasing ROM Leg press:  110# bil seat 6 3x10, Lt only 75# 3x10 Sit to stand 10# kettlebell 2x10   Therex/Neuro re-ed: NuStep: close seat to improve ROM x8 min Resisted walking: 15#- 4 ways x 8 each Standing knee flexion stretch on 14 in step Step-up on Bosu ball: Lt x 25  06/23/23 Manual Therapy: to improve mobility  Patellar mobs focusing on superior glide Prone Lt knee flexion with tissue mobilization  There Activities: to increase function with ADLS by increasing ROM Leg press: 90# x 30 for warm up, 110# bil seat 6 3x10, Lt only 75# 3x10 Sit to stand low table x  focus on shifting left  x 10  Therex/Neuro re-ed: Bike x 5 min partial revolutions working on increasing knee flexion Resisted walking: 15#- 4 ways x 8 each Standing knee flexion stretch on 14 in step Step-up on Bosu ball: Lt x 25  06/20/23 Manual Therapy: to improve mobility  Patellar mobs focusing on superior glide IASTM to distal quads and along incision in areas where pt  reports tightness Mulligan strap mobs with Passive into flexion   There  Activities: to increase function with ADLS by increasing ROM Leg press: 90# x 30 for warm up, 110# bil seat 6 3x10, Lt only 75# 3x10 Sit to stand low table x  focus on shifting left  x 10 Sit to stand x 20 with R foot on 2 inch step to bias L LE and using mirror for visual cues Step up full step down x 20 6 and 8 inch steps   Therex: Bike x 5 min partial revolutions working on increasing knee flexion Standing hip flex/ext, circles and diagonals on hip machine to try to relax muscles Standing knee flexion stretch on 14 in step   Self Care: discussion of POC for knee and upcoming shoulder   PATIENT EDUCATION:  Education details: Access Code: [email or web address] Person educated: Patient Education method: Explanation, Demonstration, and Handouts Education comprehension: verbalized understanding and returned demonstration  HOME EXERCISE PROGRAM: Access Code: [email or web address] URL: [email or web address] Date: 06/04/2023 Prepared by: Raynelle Fanning  Exercises - Seated Knee Flexion AAROM  - 3-5 x daily - 7 x weekly - 1 sets - 10 reps - 5-10 hold - Standing Knee Flexion Stretch on Step  - 3-5 x daily - 7 x weekly - 1 sets - 10 reps - 5-10 hold - Supine Quad Set  - 2 x daily - 7 x weekly - 1 sets - 10 reps - 5 hold - Supine Heel Slide with Strap  - 2 x daily - 7 x weekly - 1-2 sets - 10 reps - Standing Hip Abduction with Resistance at Ankles and Counter Support  - 1 x daily - 3-4 x weekly - 3 sets - 10 reps - Standing Hip Extension with Resistance at Ankles and Counter Support  - 1 x daily - 3-4 x weekly - 3 sets - 10 reps - Prone Knee Extension with Ankle Weight  - 2 x daily - 7 x weekly - 1 sets - 1 reps - up to 5 min hold - Prone Quadriceps Stretch with Strap (Mirrored)  - 2-3 x daily - 7 x weekly - 1 sets - 3 reps - 60 sec hold - Quadricep Stretch with Chair and Counter Support  - 2-3 x daily - 7 x weekly - 1 sets - 3 reps - 60  sec hold - Forward Step Down with Counter Support at Side  - 1 x daily - 7 x weekly - 2 sets - 10 reps  ASSESSMENT:  CLINICAL IMPRESSION: Pt continues to walk regularly for exercise and denies limitation. Improved Lt knee flexion with swing through phase of gait and he is able to sit without substitution.  Limitations remain associated with limited Lt knee flexion. He continues to work on flexion at home. PT monitored for safety and technique.  He has order for his Lt shoulder and we will evaluate next week after he has MRI of this on 06/27/23.  Patient will benefit from skilled PT to address the below impairments and improve overall function.    OBJECTIVE IMPAIRMENTS: decreased activity tolerance, decreased coordination, decreased endurance, decreased mobility, difficulty walking, decreased ROM, decreased strength, hypomobility, increased edema, impaired flexibility, and pain.   ACTIVITY LIMITATIONS: sitting, standing, squatting, stairs, transfers, bathing, dressing, hygiene/grooming, and locomotion level  PARTICIPATION LIMITATIONS: cleaning, driving, community activity, occupation, and yard work  PERSONAL FACTORS: 1-2 comorbidities: Lt TKA, lung cancer   are also affecting patient's functional outcome.   REHAB POTENTIAL: Good  CLINICAL DECISION MAKING: Stable/uncomplicated  EVALUATION COMPLEXITY: Low   GOALS: Goals reviewed with patient? Yes  SHORT TERM GOALS: Target date: 06/18/2023   Be independent in initial HEP Baseline: Goal status: MET  2.  Demonstrate Lt knee A/ROM flexion to > or = to 95 degrees to allow for sitting and negotiating steps without compensation  Baseline: 65 Goal status: IN PROGRESS  3.  Improve FOTO to > or = to 50  Baseline: 44 Goal status: MET 69 on 1/27  4.   Improve strength to ascend steps with step-over step gait with use of rail to facilitate return to work Baseline: limited by ROM deficits (06/02/23) Goal status: MET  5.  Stand and walk > or =  to 1 hour without limitation due to pain or fatigue  Baseline: 30 min (06/02/23) Goal status: IN PROGRESS (hasn't tried more than 20 min but thinks he could)   LONG TERM GOALS: Target date: 07/16/2023    Be independent in advanced HEP Baseline:  Goal status: INITIAL  2.  Improve FOTO to > or = to 75 Baseline: 44 Goal status: MET 1/27  3.  Demonstrate Lt knee A/ROM flexion to > or = to 110 degrees to allow steps and squatting without compensation Baseline: 65 Goal status: INITIAL  4.  Demonstrate symmetry with ambulation on level surface due to increased strength and ROM Baseline:  Goal status: INITIAL  5.  Improve strength to negotiate steps with step-over step gait with use of rail to facilitate return to work Baseline:  Goal status: INITIAL  6.  Stand and walk > or = to 1.5 hours without limitation due to pain or fatigue  Baseline:  Goal status: INITIAL   PLAN:  PT FREQUENCY: 2x/week  PT DURATION: 8 weeks  PLANNED INTERVENTIONS: 97110-Therapeutic exercises, 97530- Therapeutic activity, [id number]- Neuromuscular re-education, 97535- Self Care, 40981- Manual therapy, [id number]- Gait training, [phone]- Aquatic Therapy, 97014- Electrical stimulation (unattended), [id number]- Electrical stimulation (manual), 97016- Vasopneumatic device, 97035- Ultrasound, Patient/Family education, Balance training, Taping, Joint mobilization, Joint manipulation, Scar mobilization, Vestibular training, and Cryotherapy  PLAN FOR NEXT SESSION: continue working on knee flexion; pt has MRI of Rt shoulder on 06/27/23- 1 more session for knee and eval Lt shoulder after.    Lorrene Reid, PT 06/26/23 11:00 AM  [facility] [address] [place], [place] [address] Phone # [phone] Fax [phone]

## 2023-06-30 ENCOUNTER — Encounter: Payer: Self-pay | Admitting: Physical Therapy

## 2023-06-30 ENCOUNTER — Ambulatory Visit: Payer: No Typology Code available for payment source | Admitting: Physical Therapy

## 2023-06-30 DIAGNOSIS — M25562 Pain in left knee: Secondary | ICD-10-CM | POA: Diagnosis not present

## 2023-06-30 DIAGNOSIS — R2689 Other abnormalities of gait and mobility: Secondary | ICD-10-CM

## 2023-06-30 DIAGNOSIS — M6281 Muscle weakness (generalized): Secondary | ICD-10-CM

## 2023-06-30 DIAGNOSIS — R600 Localized edema: Secondary | ICD-10-CM

## 2023-06-30 NOTE — Therapy (Signed)
OUTPATIENT PHYSICAL THERAPY LOWER EXTREMITY TREATMENT   Patient Name: Jesse Stewart MRN: [id number] DOB:02-11-1966, 58 y.o., male Today's Date: 06/30/2023  END OF SESSION:  PT End of Session - 06/30/23 1019     Visit Number 15    Date for PT Re-Evaluation 07/16/23    Authorization Type [facility]-15 visits approved 12/20-4/19/25    Authorization - Visit Number 15    Authorization - Number of Visits 15    PT Start Time 1015    PT Stop Time 1058    PT Time Calculation (min) 43 min    Activity Tolerance Patient tolerated treatment well    Behavior During Therapy [facility] for tasks assessed/performed                  Past Medical History:  Diagnosis Date   Arthritis    Hypertension    Lung cancer (HCC) 03/13/2022   Past Surgical History:  Procedure Laterality Date   KNEE ARTHROSCOPY W/ MENISCAL REPAIR     LUNG REMOVAL, PARTIAL Right 03/27/2022   TOTAL KNEE ARTHROPLASTY Left 04/29/2023   Patient Active Problem List   Diagnosis Date Noted   Colon polyps 05/16/2019   Obesity (BMI 30.0-34.9) 05/13/2019   Chest tightness 10/06/2014   COVID 10/06/2014   Hematochezia 12/07/2012   Hernia of abdominal wall 12/07/2012   Well adult exam 04/23/2012   Neoplasm of uncertain behavior of skin 05/04/2010   ACTINIC KERATOSIS 05/04/2010   Dyslipidemia 04/04/2008   GERD 04/04/2008   Osteoarthritis 04/04/2008   KNEE PAIN 04/04/2008   ABNORMAL GLUCOSE NEC 04/04/2008   ABNORMAL LIVER FUNCTION TESTS 04/04/2008   Essential hypertension 12/29/2006    PCP: Tresa Garter, MD   REFERRING PROVIDER: Patrina Levering, PA  REFERRING DIAG: Left TKA 04/29/2023   THERAPY DIAG:  Acute pain of left knee  Localized edema  Other abnormalities of gait and mobility  Muscle weakness (generalized)  Rationale for Evaluation and Treatment: Rehabilitation  ONSET DATE: surgery 04/29/23  SUBJECTIVE:   SUBJECTIVE STATEMENT: Just a little stiff. Had MRI Friday morning. They said it  should take about a week to get the results.  PERTINENT HISTORY: Lt TKA 04/29/23, Lung cancer with lung resection 03/2022   PAIN: 06/26/23:  Are you having pain? Yes: NPRS scale: 2/10 (just stiff) Pain location: Lt knee  Pain description: throbbing  Aggravating factors: walking, standing  Relieving factors: ice, pain medication, rest   PRECAUTIONS: Other: history of lung cancer   RED FLAGS: None   WEIGHT BEARING RESTRICTIONS: No  FALLS:  Has patient fallen in last 6 months? 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  PATIENT SURVEYS:  05/21/23: FOTO 44 (goal is 75) 06/11/23: 69  COGNITION: Overall cognitive status: Within functional limits for tasks assessed     SENSATION: WFL  EDEMA:  Circumferential: Lt midpatellar 16.5 inches, Rt 15 inches    POSTURE: No Significant postural limitations  PALPATION: Healing surgical incision with steri strip like covering over distal aspect.  One area over proximal aspect that  looks like it might open.  PT educated pt and his wife when to seek medical attention.  LOWER EXTREMITY ROM:  Active ROM A/P Right eval Left eval Left 05/26/23 Left 122/25 Left 06/09/23 Left 2/17  Hip flexion Full ROM Full hip ROM      Hip extension        Hip abduction        Hip adduction        Hip internal rotation        Hip external rotation        Knee flexion  65 73 passive 80 passive 83 A /87 P 80/86  Knee  extension  10 -3/0  0 0  Ankle dorsiflexion        Ankle plantarflexion        Ankle inversion        Ankle eversion         (Blank rows = not tested)  LOWER EXTREMITY MMT:  MMT Right eval Left eval Left 06/09/23 Left 2/17  Hip flexion 5/5 throughout  4- 5   Hip extension  4- 5   Hip abduction      Hip adduction      Hip internal rotation      Hip external rotation      Knee flexion  4 4+ 5  Knee extension  4- 5   Ankle dorsiflexion  5    Ankle plantarflexion      Ankle inversion      Ankle eversion       (Blank rows = not tested)   GAIT: Distance walked: 50 Assistive device utilized: Single point cane Level of assistance: Complete Independence Comments: reduced Lt knee flexion with swing through.  Pelvic compensation with ascending and descending steps due to limited Lt knee flexion                                                                                                                   TREATMENT DATE:  06/26/23  There Activities: to increase function with ADLS by increasing ROM Leg press:  110# bil seat 6 3x10, Lt only 75# 3x10 Sit to stand 10# kettlebell 2x10, second set with R foot on 2 inch step  Stairs 4 laps ROM/MMT  Therex/Neuro re-ed: Bike: for ROM x 5 min Knee stretch on second step x 10 Resisted walking: 15#- 2 ways x 8 each, bwd and R sideways 20# x 8 reps Step-up on Bosu ball: Lt x 25 with one UE support, then with opp knee march x 20 Divers x 10 standing on L L SLS with R toe reaches clock x 10  06/23/23 Manual Therapy: to improve mobility  Patellar mobs focusing on superior glide Prone Lt knee flexion with tissue mobilization  There Activities: to increase function with ADLS by increasing ROM Leg press: 90# x 30 for warm up, 110# bil seat 6 3x10, Lt only 75# 3x10 Sit to stand low table x  focus on shifting left  x 10  Therex/Neuro re-ed: Bike x 5 min partial revolutions working on increasing knee flexion Resisted walking: 15#- 4 ways  x 8 each Standing knee flexion stretch on 14 in step Step-up on Bosu ball: Lt x 25 06/20/23 Manual Therapy: to improve mobility  Patellar mobs focusing on superior glide IASTM to distal quads and along incision in areas where pt reports tightness Mulligan strap mobs with Passive into flexion   There Activities: to increase function with ADLS by increasing ROM Leg press: 90# x 30 for warm up, 110# bil seat 6 3x10, Lt only 75# 3x10 Sit to stand low table x  focus on shifting left  x 10 Sit to stand x 20 with R foot on 2 inch step to bias L LE and using mirror for visual cues Step up full step down x 20 6 and 8 inch steps   Therex: Bike x 5 min partial revolutions working on increasing knee flexion Standing hip flex/ext, circles and diagonals on hip machine to try to relax muscles Standing knee flexion stretch on 14 in step   Self Care: discussion of POC for knee and upcoming shoulder   PATIENT EDUCATION:  Education details: Access Code: [email or web address] Person educated: Patient Education method: Explanation, Demonstration, and Handouts Education comprehension: verbalized understanding and returned demonstration  HOME EXERCISE PROGRAM: Access Code: [email or web address] URL: [email or web address] Date: 06/04/2023 Prepared by: Raynelle Fanning  Exercises - Seated Knee Flexion AAROM  - 3-5 x daily - 7 x weekly - 1 sets - 10 reps - 5-10 hold - Standing Knee Flexion Stretch on Step  - 3-5 x daily - 7 x weekly - 1 sets - 10 reps - 5-10 hold - Supine Quad Set  - 2 x daily - 7 x weekly - 1 sets - 10 reps - 5 hold - Supine Heel Slide with Strap  - 2 x daily - 7 x weekly - 1-2 sets - 10 reps - Standing Hip Abduction with Resistance at Ankles and Counter Support  - 1 x daily - 3-4 x weekly - 3 sets - 10 reps - Standing Hip Extension with Resistance at Ankles and Counter Support  - 1 x daily - 3-4 x weekly - 3 sets - 10 reps - Prone Knee Extension with Ankle Weight  - 2 x daily - 7 x weekly - 1 sets - 1 reps  - up to 5 min hold - Prone Quadriceps Stretch with Strap (Mirrored)  - 2-3 x daily - 7 x weekly - 1 sets - 3 reps - 60 sec hold - Quadricep Stretch with Chair and Counter Support  - 2-3 x daily - 7 x weekly - 1 sets - 3 reps - 60 sec hold - Forward Step Down with Counter Support at Side  - 1 x daily - 7 x weekly - 2 sets - 10 reps  ASSESSMENT:  CLINICAL IMPRESSION: Augustin continues to be limited in L knee flexion measuring 86 degrees passively and 80 deg actively today. Extension is full. He demonstrates full extension. His gait pattern has improved on stairs, but descending is mildly affected by knee flexion deficits as is swing phase in gait. He demonstrated good SLS today with balance activities and his knee is at full strength. Sit to stand is still biased R somewhat which we corrected using a 2 inch step today. Patient would like to try swimming, so we discussed scheduling an aquatic PT visit so he can learn techniques to further work on ROM in the pool. Plan to focus  on shoulder beginning next visit.     OBJECTIVE IMPAIRMENTS: decreased activity tolerance, decreased coordination, decreased endurance, decreased mobility, difficulty walking, decreased ROM, decreased strength, hypomobility, increased edema, impaired flexibility, and pain.   ACTIVITY LIMITATIONS: sitting, standing, squatting, stairs, transfers, bathing, dressing, hygiene/grooming, and locomotion level  PARTICIPATION LIMITATIONS: cleaning, driving, community activity, occupation, and yard work  PERSONAL FACTORS: 1-2 comorbidities: Lt TKA, lung cancer   are also affecting patient's functional outcome.   REHAB POTENTIAL: Good  CLINICAL DECISION MAKING: Stable/uncomplicated  EVALUATION COMPLEXITY: Low   GOALS: Goals reviewed with patient? Yes  SHORT TERM GOALS: Target date: 06/18/2023   Be independent in initial HEP Baseline: Goal status: MET  2.  Demonstrate Lt knee A/ROM flexion to > or = to 95 degrees to allow for  sitting and negotiating steps without compensation  Baseline: 65 Goal status: NOT MET  3.  Improve FOTO to > or = to 50  Baseline: 44 Goal status: MET 69 on 1/27  4.   Improve strength to ascend steps with step-over step gait with use of rail to facilitate return to work Baseline: limited by ROM deficits (06/02/23) Goal status: MET  5.  Stand and walk > or = to 1 hour without limitation due to pain or fatigue  Baseline: 30 min (06/02/23) Goal status: MET walked 1 hour and 15 min 2/16 without difficulty   LONG TERM GOALS: Target date: 07/16/2023    Be independent in advanced HEP Baseline:  Goal status: ,MET  2.  Improve FOTO to > or = to 75 Baseline: 44 Goal status: MET 1/27  3.  Demonstrate Lt knee A/ROM flexion to > or = to 110 degrees to allow steps and squatting without compensation Baseline: 65 Goal status: IN PROGRESS  4.  Demonstrate symmetry with ambulation on level surface due to increased strength and ROM Baseline:  Goal status: IN PROGRESS  5.  Improve strength to negotiate steps with step-over step gait with use of rail to facilitate return to work Baseline:  Goal status: PARTIALLY MET  6.  Stand and walk > or = to 1.5 hours without limitation due to pain or fatigue  Baseline:  Goal status: PARTIALLY MET   PLAN:  PT FREQUENCY: 2x/week  PT DURATION: 8 weeks  PLANNED INTERVENTIONS: 97110-Therapeutic exercises, 97530- Therapeutic activity, [id number]- Neuromuscular re-education, 97535- Self Care, 16109- Manual therapy, [id number]- Gait training, [id number]- Aquatic Therapy, 97014- Electrical stimulation (unattended), [id number]- Electrical stimulation (manual), 97016- Vasopneumatic device, 97035- Ultrasound, Patient/Family education, Balance training, Taping, Joint mobilization, Joint manipulation, Scar mobilization, Vestibular training, and Cryotherapy  PLAN FOR NEXT SESSION: Eval shoulder, schedule one aquatic visit for knee.  Solon Palm, PT  06/30/23 11:01 AM   [facility] [address], [address] [place], [place] [address] Phone # [phone] Fax [phone]

## 2023-07-02 NOTE — Therapy (Signed)
OUTPATIENT PHYSICAL THERAPY UPPER EXTREMITY EVALUATION   Patient Name: Jesse Stewart MRN: [id number] DOB:04/02/1966, 58 y.o., male Today's Date: 07/03/2023  END OF SESSION:  PT End of Session - 07/03/23 1107     Visit Number 1   shoulder and knee   Date for PT Re-Evaluation 08/28/23    Authorization Type [facility]-15 visits approved 06/11/23-10/09/23    Authorization - Visit Number 1    Authorization - Number of Visits 15    PT Start Time 1006    PT Stop Time 1046    PT Time Calculation (min) 40 min    Activity Tolerance Patient tolerated treatment well    Behavior During Therapy [facility] for tasks assessed/performed             Past Medical History:  Diagnosis Date   Arthritis    Hypertension    Lung cancer (HCC) 03/13/2022   Past Surgical History:  Procedure Laterality Date   KNEE ARTHROSCOPY W/ MENISCAL REPAIR     LUNG REMOVAL, PARTIAL Right 03/27/2022   TOTAL KNEE ARTHROPLASTY Left 04/29/2023   Patient Active Problem List   Diagnosis Date Noted   Colon polyps 05/16/2019   Obesity (BMI 30.0-34.9) 05/13/2019   Chest tightness 10/06/2014   COVID 10/06/2014   Hematochezia 12/07/2012   Hernia of abdominal wall 12/07/2012   Well adult exam 04/23/2012   Neoplasm of uncertain behavior of skin 05/04/2010   ACTINIC KERATOSIS 05/04/2010   Dyslipidemia 04/04/2008   GERD 04/04/2008   Osteoarthritis 04/04/2008   KNEE PAIN 04/04/2008   ABNORMAL GLUCOSE NEC 04/04/2008   ABNORMAL LIVER FUNCTION TESTS 04/04/2008   Essential hypertension 12/29/2006    PCP: Jesse Shoe, MD  REFERRING PROVIDER: Patrina Levering, MD  REFERRING DIAG: Lt frozen shoulder, Left TKA 04/29/2023   THERAPY DIAG:  Acute pain of left knee - Plan: PT plan of care cert/re-cert  Other abnormalities of gait and mobility - Plan: PT plan of care cert/re-cert  Muscle weakness (generalized) - Plan: PT plan of care cert/re-cert  Chronic left shoulder pain - Plan: PT plan of care  cert/re-cert  Stiffness of left shoulder, not elsewhere classified - Plan: PT plan of care cert/re-cert  Stiffness of left knee, not elsewhere classified - Plan: PT plan of care cert/re-cert  Rationale for Evaluation and Treatment: Rehabilitation  ONSET DATE: Lt TKA 04/29/23, Lt shoulder 3-4 month history of pain and limited A/ROM  SUBJECTIVE:                                                                                                                                                                                      SUBJECTIVE STATEMENT: Pt presents to  PT with Lt should pain and Limited A/ROM.  He continues treatment s/p Lt TKA performed 04/28/24 Hand dominance: Left  PERTINENT HISTORY: Lt TKA 04/29/23, Lung cancer with lung resection 03/2022   PAIN: 07/02/23 Are you having pain? Yes: NPRS scale: 0-7/10 Pain location: Lt shoulder  Pain description: brief, only when performing aggravating activity  Aggravating factors: raising arm up or out, lifting in that position Relieving factors: lasts a few minutes after aggravating activity and reduces when stopping the aggravating activity  PRECAUTIONS: None  RED FLAGS: None   WEIGHT BEARING RESTRICTIONS: No  FALLS:  Has patient fallen in last 6 months? 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  DIAGNOSTIC FINDINGS:  MRI on 07/25/23: Lt shoulder, no result yet  PATIENT SURVEYS :  07/02/23: Jesse Stewart 18.2%   06/11/23: 69   COGNITION: Overall cognitive status: Within functional limits for tasks assessed     SENSATION: WFL  POSTURE: Forward head and rounded shoulder posture   UPPER EXTREMITY ROM:   Active ROM Right eval Left eval  Shoulder flexion 162 125*  Shoulder extension full 90*  Shoulder abduction    Shoulder adduction    Shoulder internal rotation  Lacking 5" vs the Rt  Shoulder external rotation  Lacking 3" vs Rt  Elbow flexion    Elbow extension    Wrist flexion    Wrist extension    Wrist ulnar deviation    Wrist radial deviation    Wrist pronation    Wrist supination    (Blank rows = not tested) P/ROM: Rt IR 25, ER 46, flexion 140 Lt knee 0-86   UPPER EXTREMITY MMT:  MMT Right eval Left eval  Shoulder flexion 5/5 throughout  4-  Shoulder extension    Shoulder abduction  4-  Shoulder adduction    Shoulder internal rotation  4  Shoulder external rotation  4  Middle trapezius    Lower trapezius    Elbow flexion    Elbow extension    Wrist flexion    Wrist extension    Wrist ulnar deviation    Wrist radial deviation    Wrist pronation    Wrist supination    Grip strength (lbs)    (Blank rows = not tested)  JOINT MOBILITY TESTING:  Limited joint mobility of the Lt glenohumeral joint  PALPATION:  Palpable tenderness over Lt deltoid, posterior glenohumeral joint and proximal biceps tendon Limited PA mobility in the thoracic spine  TREATMENT DATE:  07/03/23:  Findings from evaluation discussed, pt educated on plan of care, HEP initiated.    PATIENT EDUCATION: Education details: [email or web address]  Person educated: Patient Education method: Explanation, Demonstration, and Handouts Education comprehension: verbalized understanding and returned demonstration  HOME EXERCISE PROGRAM: For Knee: Access Code: [email or web address]  Access Code: [email or web address] URL:  [email or web address] Date: 07/03/2023 Prepared by: Jesse Stewart - Supine Shoulder Flexion Extension AAROM with Dowel  - 2-3 x daily - 7 x weekly - 1 sets - 5-10 reps - 10 hold - Supine Shoulder External Rotation with Dowel  - 2-3 x daily - 7 x weekly - 1 sets - 5-10 reps - 10 hold - Shoulder Flexion Wall Slide with Towel  - 2 x daily - 7 x weekly - 1 sets - 10 reps - 10 hold - Standing Shoulder Abduction Slides at Wall  - 2 x daily - 7 x weekly - 1 sets - 10 reps - 10 hold - Standing Shoulder Internal Rotation Stretch with Towel  - 2 x daily - 7 x weekly - 1 sets - 10 reps - 10 hold Open book  ASSESSMENT:  CLINICAL IMPRESSION: Patient is a 58 y.o. Lt hand dominant male who was seen today for physical therapy evaluation and treatment for Lt shoulder pain and continuation of treatment for Lt knee s/p TKA. Pt reports that Lt shoulder pain began months ago and he has limited ROM with overhead motion.  Pt demonstrates limited Lt shoulder A/ROM and pain at end range, postural dysfunction and limited thoracic mobility.  Pt with limited Lt knee A/ROM and gait abnormality s/p TKA.  He has HEP in place for knee flexibility and is compliant with this.  Patient will benefit from skilled PT to address the below impairments and improve overall function.   OBJECTIVE IMPAIRMENTS: Abnormal gait, decreased activity tolerance, decreased balance, decreased mobility, difficulty walking, decreased ROM, decreased strength, hypomobility, increased muscle spasms, impaired flexibility, impaired UE functional use, postural dysfunction, and pain.   ACTIVITY LIMITATIONS: carrying, lifting, squatting, stairs, transfers, reach over head, and locomotion level  PARTICIPATION LIMITATIONS: meal prep, cleaning, community activity, and occupation  PERSONAL FACTORS: 1-2 comorbidities: Rt TKA and lung cancer   are also affecting patient's functional outcome.   REHAB POTENTIAL: Good  CLINICAL DECISION MAKING:  Stable/uncomplicated  EVALUATION COMPLEXITY: Low  GOALS: Goals reviewed with patient? Yes  SHORT TERM GOALS: Target date: 07/31/2023    Be independent in initial HEP Baseline: Goal status: INITIAL  2.  Demonstrate Lt shoulder A/ROM flexion to > or = to 135 degrees for overhead reaching  Baseline: 125 Goal status: INITIAL  3.  Demonstrate Lt shoulder IR to L3 to improve dressing and self care Baseline: Lateral glute on Lt and lacking 5" vs Rt Goal status: INITIAL  4.  Report < or = to 4/10 Lt shoulder pain with overhead reaching Baseline: 6/10 Goal status: INITIAL   LONG TERM GOALS: Target date: 08/28/2023    Be independent in advanced HEP for knee and shoulder  Baseline:  Goal status: INITIAL  2.  Knee: Demonstrate Lt knee A/ROM flexion to > or = to 110 degrees to allow steps and squatting without compensation  Baseline:  Goal status: IN PROGRESS  3.  Knee: Improve strength to negotiate steps with step-over step gait with use of rail to facilitate return to work  Baseline:  Goal status: INITIAL  4.  Improve Lt shoulder A/ROM flexion to > or = to 150 degrees to improve overhead reaching  Baseline:  125 Goal status: INITIAL  5.  Improve DASH to < or = to 6% to improve functional use  Baseline: 18% Goal status: INITIAL  6.  Report < or = to 3/10 Lt shoulder pain with lifting and overhead use Baseline:  Goal status: INITIAL  PLAN: PT FREQUENCY: 2x/week  PT DURATION: 8 weeks  PLANNED INTERVENTIONS: 97110-Therapeutic exercises, 97530- Therapeutic activity, 97112- Neuromuscular re-education, 97535- Self Care, 78295- Manual therapy, [phone]- Gait training, [phone]- Aquatic Therapy, [phone]- Electrical stimulation (unattended), [phone]- Electrical stimulation (manual), [id number]- Vasopneumatic device, [id number]- Ultrasound, [id number]- Traction (mechanical), [id number]- Ionotophoresis 4mg /ml Dexamethasone, Patient/Family education, Stair training, Taping, Dry Needling, Joint mobilization, Joint  manipulation, Scar mobilization, Vestibular training, Cryotherapy, and Moist heat  PLAN FOR NEXT SESSION: Lt shoulder ROM, thoracic mobility, try IASTM to scapula and shoulder, postural strength    Lorrene Reid, PT 07/03/23 11:08 AM   [facility] [address] [place], [place] [address] Phone # [phone] Fax [phone]

## 2023-07-03 ENCOUNTER — Other Ambulatory Visit: Payer: Self-pay

## 2023-07-03 ENCOUNTER — Ambulatory Visit: Payer: No Typology Code available for payment source | Attending: Physician Assistant

## 2023-07-03 DIAGNOSIS — G8929 Other chronic pain: Secondary | ICD-10-CM | POA: Diagnosis present

## 2023-07-03 DIAGNOSIS — M25562 Pain in left knee: Secondary | ICD-10-CM | POA: Diagnosis present

## 2023-07-03 DIAGNOSIS — R2689 Other abnormalities of gait and mobility: Secondary | ICD-10-CM | POA: Insufficient documentation

## 2023-07-03 DIAGNOSIS — M6281 Muscle weakness (generalized): Secondary | ICD-10-CM | POA: Diagnosis present

## 2023-07-03 DIAGNOSIS — M25662 Stiffness of left knee, not elsewhere classified: Secondary | ICD-10-CM | POA: Diagnosis present

## 2023-07-03 DIAGNOSIS — M25512 Pain in left shoulder: Secondary | ICD-10-CM | POA: Diagnosis present

## 2023-07-03 DIAGNOSIS — M25612 Stiffness of left shoulder, not elsewhere classified: Secondary | ICD-10-CM | POA: Insufficient documentation

## 2023-07-10 ENCOUNTER — Ambulatory Visit: Payer: No Typology Code available for payment source

## 2023-07-10 DIAGNOSIS — M6281 Muscle weakness (generalized): Secondary | ICD-10-CM

## 2023-07-10 DIAGNOSIS — M25562 Pain in left knee: Secondary | ICD-10-CM | POA: Diagnosis not present

## 2023-07-10 DIAGNOSIS — M25662 Stiffness of left knee, not elsewhere classified: Secondary | ICD-10-CM

## 2023-07-10 DIAGNOSIS — G8929 Other chronic pain: Secondary | ICD-10-CM

## 2023-07-10 DIAGNOSIS — M25612 Stiffness of left shoulder, not elsewhere classified: Secondary | ICD-10-CM

## 2023-07-10 NOTE — Therapy (Signed)
 OUTPATIENT PHYSICAL THERAPY TREATMENT   Patient Name: Jesse Stewart MRN: [id number] DOB:[birth year], 58 y.o., male Today's Date: 07/10/2023  END OF SESSION:  PT End of Session - 07/10/23 1100     Visit Number 2    Date for PT Re-Evaluation 08/28/23    Authorization Type [facility]-15 visits approved 06/11/23-10/09/23    Authorization - Visit Number 2    Authorization - Number of Visits 15    PT Start Time 1018    PT Stop Time 1059    PT Time Calculation (min) 41 min    Activity Tolerance Patient tolerated treatment well    Behavior During Therapy [facility] for tasks assessed/performed              Past Medical History:  Diagnosis Date   Arthritis    Hypertension    Lung cancer (HCC) 03/13/2022   Past Surgical History:  Procedure Laterality Date   KNEE ARTHROSCOPY W/ MENISCAL REPAIR     LUNG REMOVAL, PARTIAL Right 03/27/2022   TOTAL KNEE ARTHROPLASTY Left 04/29/2023   Patient Active Problem List   Diagnosis Date Noted   Colon polyps 05/16/2019   Obesity (BMI 30.0-34.9) 05/13/2019   Chest tightness 10/06/2014   COVID 10/06/2014   Hematochezia 12/07/2012   Hernia of abdominal wall 12/07/2012   Well adult exam 04/23/2012   Neoplasm of uncertain behavior of skin 05/04/2010   ACTINIC KERATOSIS 05/04/2010   Dyslipidemia 04/04/2008   GERD 04/04/2008   Osteoarthritis 04/04/2008   KNEE PAIN 04/04/2008   ABNORMAL GLUCOSE NEC 04/04/2008   ABNORMAL LIVER FUNCTION TESTS 04/04/2008   Essential hypertension 12/29/2006    PCP: Jacinta Shoe, MD  REFERRING PROVIDER: Patrina Levering, MD  REFERRING DIAG: Lt frozen shoulder, Left TKA 04/29/2023   THERAPY DIAG:  Acute pain of left knee  Muscle weakness (generalized)  Chronic left shoulder pain  Stiffness of left shoulder, not elsewhere classified  Stiffness of left knee, not elsewhere classified  Rationale for Evaluation and Treatment: Rehabilitation  ONSET DATE: Lt TKA 04/29/23, Lt shoulder 3-4 month history  of pain and limited A/ROM  SUBJECTIVE:                                                                                                                                                                                      SUBJECTIVE STATEMENT: I'm doing great.   Hand dominance: Left  PERTINENT HISTORY: Lt TKA 04/29/23, Lung cancer with lung resection 03/2022   PAIN: 07/10/23 Are you having pain? Yes: NPRS scale: 0-7/10 Pain location: Lt shoulder  Pain description: brief, only when performing aggravating activity  Aggravating factors: raising arm up or out, lifting in that position  Relieving factors: lasts a few minutes after aggravating activity and reduces when stopping the aggravating activity  PRECAUTIONS: None  RED FLAGS: None   WEIGHT BEARING RESTRICTIONS: No  FALLS:  Has patient fallen in last 6 months? 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  DIAGNOSTIC FINDINGS:  MRI on 07/25/23: Lt shoulder, no result yet  PATIENT SURVEYS :  07/02/23: [name] 18.2%  06/11/23: FOTO: 69   COGNITION: Overall cognitive status: Within functional limits for tasks assessed     SENSATION: WFL  POSTURE: Forward head and rounded shoulder posture   UPPER EXTREMITY ROM:   Active ROM Right eval Left eval  Shoulder flexion 162 125*  Shoulder extension full 90*  Shoulder abduction    Shoulder adduction    Shoulder internal rotation  Lacking 5" vs the Rt  Shoulder  external rotation  Lacking 3" vs Rt  Elbow flexion    Elbow extension    Wrist flexion    Wrist extension    Wrist ulnar deviation    Wrist radial deviation    Wrist pronation    Wrist supination    (Blank rows = not tested) P/ROM: Rt IR 25, ER 46, flexion 140 Lt knee 0-86  07/10/23: Lt knee P/ROM supine 84 degrees   UPPER EXTREMITY MMT:  MMT Right eval Left eval  Shoulder flexion 5/5 throughout  4-  Shoulder extension    Shoulder abduction  4-  Shoulder adduction    Shoulder internal rotation  4  Shoulder external rotation  4  Middle trapezius    Lower trapezius    Elbow flexion    Elbow extension    Wrist flexion    Wrist extension    Wrist ulnar deviation    Wrist radial deviation    Wrist pronation    Wrist supination    Grip strength (lbs)    (Blank rows = not tested)  JOINT MOBILITY TESTING:  Limited joint mobility of the Lt glenohumeral joint  PALPATION:  Palpable tenderness over Lt deltoid, posterior glenohumeral joint and proximal biceps tendon Limited PA mobility in the thoracic spine                                                                                                         TREATMENT DATE:   07/10/23:  Arm Bike: Level 1.3x 6 min (3/3)-PT present to discuss  Pulleys: flexion x3 min, abduction x2 min, IR x2 min Supine flexion with dowel x10, ER x10 Finger ladder: flexion and abduction x10 Bil shoulder extension and row with green band 2x10 bil each  Knee flexion  on step x10 Manual: P/ROM: Lt shoulder flexion, ER, abduction with mobs  07/03/23:  Findings from evaluation discussed, pt educated on plan of care, HEP initiated.    PATIENT EDUCATION: Education details: [email or web address]  Person educated: Patient Education method: Explanation, Demonstration, and Handouts Education comprehension: verbalized understanding and returned demonstration  HOME EXERCISE PROGRAM: For Knee: Access Code: [email or web address]  Access Code: [email or web address] URL:  [email or web address] Date: 07/03/2023 Prepared by: Tresa Endo - Supine Shoulder Flexion Extension AAROM with Dowel  - 2-3 x daily - 7 x weekly - 1 sets - 5-10 reps - 10 hold - Supine Shoulder External Rotation with Dowel  - 2-3 x daily - 7 x weekly - 1 sets - 5-10 reps - 10 hold - Shoulder Flexion Wall Slide with Towel  - 2 x daily - 7 x weekly - 1 sets - 10 reps - 10 hold - Standing Shoulder Abduction Slides at Wall  - 2 x daily - 7 x weekly - 1 sets - 10 reps - 10 hold - Standing Shoulder Internal Rotation Stretch with Towel  - 2 x daily - 7 x weekly - 1 sets - 10 reps - 10 hold Open book  ASSESSMENT:  CLINICAL IMPRESSION: First time follow-up after evaluation for Lt shoulder.  Pt demonstrated all aspects of HEP correctly.  He has HEP in place for knee flexibility and is compliant with this as well. PT monitored throughout session for scapular depression and alignment. Patient will benefit from skilled PT to address the below impairments and improve overall function.   OBJECTIVE IMPAIRMENTS: Abnormal gait, decreased activity tolerance, decreased balance, decreased mobility, difficulty walking, decreased ROM, decreased strength, hypomobility, increased muscle spasms, impaired flexibility, impaired UE functional use, postural dysfunction, and pain.   ACTIVITY LIMITATIONS: carrying, lifting, squatting, stairs, transfers, reach over head, and locomotion level  PARTICIPATION LIMITATIONS: meal prep, cleaning, community activity, and occupation  PERSONAL FACTORS: 1-2 comorbidities: Rt TKA and lung cancer   are also affecting patient's functional outcome.   REHAB POTENTIAL: Good  CLINICAL DECISION MAKING: Stable/uncomplicated  EVALUATION COMPLEXITY: Low  GOALS: Goals reviewed with patient? Yes  SHORT TERM GOALS: Target date: 07/31/2023    Be independent in initial HEP Baseline: Goal status: INITIAL  2.  Demonstrate Lt shoulder A/ROM flexion to > or = to 135 degrees for  overhead reaching  Baseline: 125 Goal status: INITIAL  3.  Demonstrate Lt shoulder IR to L3 to improve dressing and self care Baseline: Lateral glute on Lt and lacking 5" vs Rt Goal status: INITIAL  4.  Report < or = to 4/10 Lt shoulder pain with overhead reaching Baseline: 6/10 Goal status: INITIAL   LONG TERM GOALS: Target date: 08/28/2023    Be independent in advanced HEP for knee and shoulder  Baseline:  Goal status: INITIAL  2.  Knee: Demonstrate Lt knee A/ROM flexion to > or = to 110 degrees to allow steps and squatting without compensation  Baseline:  Goal status: IN PROGRESS  3.  Knee: Improve strength to negotiate steps with step-over step gait with use of rail to facilitate return to work  Baseline:  Goal status: INITIAL  4.  Improve Lt shoulder A/ROM flexion to > or = to 150 degrees to improve overhead reaching  Baseline: 125 Goal status: INITIAL  5.  Improve DASH to < or = to 6% to improve functional use  Baseline: 18% Goal status: INITIAL  6.  Report < or = to 3/10 Lt shoulder pain with lifting and overhead use Baseline:  Goal status: INITIAL  PLAN: PT FREQUENCY: 2x/week  PT DURATION: 8 weeks  PLANNED INTERVENTIONS: 97110-Therapeutic exercises, 97530- Therapeutic activity, 97112- Neuromuscular re-education, 97535- Self Care, 40347- Manual therapy, [phone]- Gait training, [phone]- Aquatic Therapy, [phone]- Electrical stimulation (unattended), [phone]- Electrical stimulation (manual), [id number]- Vasopneumatic device, [id number]- Ultrasound, [id number]- Traction (mechanical), [id number]- Ionotophoresis 4mg /ml Dexamethasone, Patient/Family education, Stair training, Taping, Dry Needling, Joint mobilization, Joint manipulation, Scar mobilization, Vestibular training, Cryotherapy, and Moist heat  PLAN FOR NEXT SESSION: Lt shoulder ROM, thoracic mobility, try IASTM to scapula and shoulder, postural strength    Lorrene Reid, PT 07/10/23 11:03 AM   [facility] [address], [address] [place], [place] [address] Phone # [phone] Fax [phone]

## 2023-07-15 ENCOUNTER — Ambulatory Visit: Payer: No Typology Code available for payment source | Attending: Physician Assistant

## 2023-07-15 DIAGNOSIS — M25612 Stiffness of left shoulder, not elsewhere classified: Secondary | ICD-10-CM | POA: Insufficient documentation

## 2023-07-15 DIAGNOSIS — M25562 Pain in left knee: Secondary | ICD-10-CM | POA: Diagnosis present

## 2023-07-15 DIAGNOSIS — M25512 Pain in left shoulder: Secondary | ICD-10-CM | POA: Diagnosis present

## 2023-07-15 DIAGNOSIS — M6281 Muscle weakness (generalized): Secondary | ICD-10-CM | POA: Diagnosis present

## 2023-07-15 DIAGNOSIS — M25662 Stiffness of left knee, not elsewhere classified: Secondary | ICD-10-CM | POA: Diagnosis present

## 2023-07-15 DIAGNOSIS — G8929 Other chronic pain: Secondary | ICD-10-CM | POA: Diagnosis present

## 2023-07-15 DIAGNOSIS — R2689 Other abnormalities of gait and mobility: Secondary | ICD-10-CM | POA: Diagnosis present

## 2023-07-15 NOTE — Therapy (Signed)
 OUTPATIENT PHYSICAL THERAPY TREATMENT   Patient Name: Jesse Stewart MRN: [id number] DOB:[birth year], 58 y.o., male Today's Date: 07/15/2023  END OF SESSION:  PT End of Session - 07/15/23 1021     Visit Number 3    Date for PT Re-Evaluation 08/28/23    Authorization Type [employer]-15 visits approved 06/11/23-10/09/23    Authorization - Visit Number 3    Authorization - Number of Visits 15    PT Start Time 0932    PT Stop Time 1020    PT Time Calculation (min) 48 min    Activity Tolerance Patient tolerated treatment well    Behavior During Therapy [facility] for tasks assessed/performed               Past Medical History:  Diagnosis Date   Arthritis    Hypertension    Lung cancer (HCC) 03/13/2022   Past Surgical History:  Procedure Laterality Date   KNEE ARTHROSCOPY W/ MENISCAL REPAIR     LUNG REMOVAL, PARTIAL Right 03/27/2022   TOTAL KNEE ARTHROPLASTY Left 04/29/2023   Patient Active Problem List   Diagnosis Date Noted   Colon polyps 05/16/2019   Obesity (BMI 30.0-34.9) 05/13/2019   Chest tightness 10/06/2014   COVID 10/06/2014   Hematochezia 12/07/2012   Hernia of abdominal wall 12/07/2012   Well adult exam 04/23/2012   Neoplasm of uncertain behavior of skin 05/04/2010   ACTINIC KERATOSIS 05/04/2010   Dyslipidemia 04/04/2008   GERD 04/04/2008   Osteoarthritis 04/04/2008   KNEE PAIN 04/04/2008   ABNORMAL GLUCOSE NEC 04/04/2008   ABNORMAL LIVER FUNCTION TESTS 04/04/2008   Essential hypertension 12/29/2006    PCP: Jacinta Shoe, MD  REFERRING PROVIDER: Patrina Levering, MD  REFERRING DIAG: Lt frozen shoulder, Left TKA 04/29/2023   THERAPY DIAG:  Acute pain of left knee  Chronic left shoulder pain  Muscle weakness (generalized)  Stiffness of left shoulder, not elsewhere classified  Rationale for Evaluation and Treatment: Rehabilitation  ONSET DATE: Lt TKA 04/29/23, Lt shoulder 3-4 month history of pain and limited A/ROM  SUBJECTIVE:                                                                                                                                                                                       SUBJECTIVE STATEMENT: I'm doing great.   Hand dominance: Left  PERTINENT HISTORY: Lt TKA 04/29/23, Lung cancer with lung resection 03/2022   PAIN: 07/10/23 Are you having pain? Yes: NPRS scale: 0-7/10 Pain location: Lt shoulder  Pain description: brief, only when performing aggravating activity  Aggravating factors: raising arm up or out, lifting in that position Relieving factors: lasts a few minutes after  aggravating activity and reduces when stopping the aggravating activity  PRECAUTIONS: None  RED FLAGS: None   WEIGHT BEARING RESTRICTIONS: No  FALLS:  Has patient fallen in last 6 months? 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  DIAGNOSTIC FINDINGS:  MRI on 07/25/23: Lt shoulder, no result yet  PATIENT SURVEYS :  07/02/23: [name] 18.2%  06/11/23: FOTO: 69   COGNITION: Overall cognitive status: Within functional limits for tasks assessed     SENSATION: WFL  POSTURE: Forward head and rounded shoulder posture   UPPER EXTREMITY ROM:   Active ROM Right eval Left eval Left 07/15/23  Shoulder flexion 162 125* 137  Shoulder extension full 90* 105  Shoulder abduction     Shoulder adduction     Shoulder internal rotation  Lacking 5" vs the Rt Lacking 5" vs the Rt   Shoulder  external rotation  Lacking 3" vs Rt Lacking 2.5 inches vs Rt   Elbow flexion     Elbow extension     Wrist flexion     Wrist extension     Wrist ulnar deviation     Wrist radial deviation     Wrist pronation     Wrist supination     (Blank rows = not tested) P/ROM: Rt IR 25, ER 46, flexion 140 Lt knee 0-86  07/10/23: Lt knee P/ROM supine 84 degrees   UPPER EXTREMITY MMT:  MMT Right eval Left eval  Shoulder flexion 5/5 throughout  4-  Shoulder extension    Shoulder abduction  4-  Shoulder adduction    Shoulder internal rotation  4  Shoulder external rotation  4  Middle trapezius    Lower trapezius    Elbow flexion    Elbow extension    Wrist flexion    Wrist extension    Wrist ulnar deviation    Wrist radial deviation    Wrist pronation    Wrist supination    Grip strength (lbs)    (Blank rows = not tested)  JOINT MOBILITY TESTING:  Limited joint mobility of the Lt glenohumeral joint  PALPATION:  Palpable tenderness over Lt deltoid, posterior glenohumeral joint and proximal biceps tendon Limited PA mobility in the thoracic spine                                                                                                         TREATMENT DATE:  07/14/23:  Arm Bike: Level 1.3x 6 min (3/3)-PT present to discuss  Pulleys: flexion x3 min, abduction x2 min, IR x2 min Leg press: seat 6 Bil 95# x25, Lt only  55# x25 Finger ladder: flexion and abduction x10 Bil shoulder extension and row with green band 2x10 bil each  Seated ER green band 2x10 Knee flexion on step x10 Manual: P/ROM: Lt shoulder flexion, ER, abduction with mobs  07/10/23:  Arm Bike: Level 1.3x 6 min (3/3)-PT present to discuss  Pulleys: flexion x3 min, abduction x2 min, IR x2 min Supine flexion with dowel x10, ER x10 Finger ladder: flexion and abduction x10 Bil shoulder extension and row with green band 2x10 bil each  Knee flexion on step x10 Manual: P/ROM: Lt shoulder flexion, ER, abduction with  mobs  07/03/23:  Findings from evaluation discussed, pt educated on plan of care, HEP initiated.    PATIENT EDUCATION: Education details: [email or web address]  Person educated: Patient Education method: Explanation, Demonstration, and Handouts Education comprehension: verbalized understanding and returned demonstration  HOME EXERCISE PROGRAM: For Knee: Access Code: [email or web address]  Access Code: [email or web address] URL: [email or web address] Date: 07/15/2023 Prepared by: Tresa Endo  Exercises  - Supine Shoulder Flexion Extension AAROM with Dowel  - 2-3 x daily - 7 x weekly - 1 sets - 5-10 reps - 10 hold - Supine Shoulder External Rotation with Dowel  - 2-3 x daily - 7 x weekly - 1 sets - 5-10 reps - 10 hold - Shoulder Flexion Wall Slide with Towel  - 2 x daily - 7 x weekly - 1 sets - 10 reps - 10 hold - Standing Shoulder Abduction Slides at Wall  - 2 x daily - 7 x weekly - 1 sets - 10 reps - 10 hold - Standing Shoulder Internal Rotation Stretch with Towel  - 2 x daily - 7 x weekly - 1 sets - 10 reps - 10 hold - Sidelying Open Book Thoracic Lumbar Rotation and Extension  - 2 x daily - 7 x weekly - 1 sets - 10 reps - Shoulder Extension with Resistance - Palms Forward  - 1 x daily - 7 x weekly - 2 sets - 10 reps - Standing Row with Anchored Resistance  - 1 x daily - 7 x weekly - 2 sets - 10 reps - Standing Shoulder External Rotation with Resistance  - 1 x daily - 7 x weekly - 2 sets - 10 reps ASSESSMENT:  CLINICAL IMPRESSION: Pt is consistent with HEP for flexibility.  He tolerated addition of strength exercises today and these added to HEP.  Pt required frequent cueing for scapular depression and upright posture. Pt with improved Lt shoulder ROM since the start of care.  Pt continues to work on knee flexion at home and seems to have reached a plateau with this. He will see knee MD again later this month.   Patient will benefit from skilled PT to address the below impairments and improve overall function.    OBJECTIVE IMPAIRMENTS: Abnormal gait, decreased activity tolerance, decreased balance, decreased mobility, difficulty walking, decreased ROM, decreased strength, hypomobility, increased muscle spasms, impaired flexibility, impaired UE functional use, postural dysfunction, and pain.   ACTIVITY LIMITATIONS: carrying, lifting, squatting, stairs, transfers, reach over head, and locomotion level  PARTICIPATION LIMITATIONS: meal prep, cleaning, community activity, and occupation  PERSONAL FACTORS: 1-2 comorbidities: Rt TKA and lung cancer   are also affecting patient's functional outcome.   REHAB POTENTIAL: Good  CLINICAL DECISION MAKING: Stable/uncomplicated  EVALUATION COMPLEXITY: Low  GOALS: Goals reviewed with patient? Yes  SHORT TERM GOALS: Target date: 07/31/2023    Be independent in initial HEP Baseline: Goal status: INITIAL  2.  Demonstrate Lt shoulder A/ROM flexion to >  or = to 135 degrees for overhead reaching  Baseline: 125 Goal status: INITIAL  3.  Demonstrate Lt shoulder IR to L3 to improve dressing and self care Baseline: Lateral glute on Lt and lacking 5" vs Rt Goal status: INITIAL  4.  Report < or = to 4/10 Lt shoulder pain with overhead reaching Baseline: 6/10 Goal status: INITIAL   LONG TERM GOALS: Target date: 08/28/2023    Be independent in advanced HEP for knee and shoulder  Baseline:  Goal status: INITIAL  2.  Knee: Demonstrate Lt knee A/ROM flexion to > or = to 110 degrees to allow steps and squatting without compensation  Baseline:  Goal status: IN PROGRESS  3.  Knee: Improve strength to negotiate steps with step-over step gait with use of rail to facilitate return to work  Baseline:  Goal status: INITIAL  4.  Improve Lt shoulder A/ROM flexion to > or = to 150 degrees to improve overhead reaching  Baseline: 125 Goal status: INITIAL  5.  Improve DASH to < or = to 6% to improve functional use  Baseline: 18% Goal status: INITIAL  6.  Report  < or = to 3/10 Lt shoulder pain with lifting and overhead use Baseline:  Goal status: INITIAL  PLAN: PT FREQUENCY: 2x/week  PT DURATION: 8 weeks  PLANNED INTERVENTIONS: 97110-Therapeutic exercises, 97530- Therapeutic activity, 97112- Neuromuscular re-education, 97535- Self Care, 96045- Manual therapy, [phone]- Gait training, [phone]- Aquatic Therapy, [phone]- Electrical stimulation (unattended), [phone]- Electrical stimulation (manual), [id number]- Vasopneumatic device, [id number]- Ultrasound, [id number]- Traction (mechanical), [id number]- Ionotophoresis 4mg /ml Dexamethasone, Patient/Family education, Stair training, Taping, Dry Needling, Joint mobilization, Joint manipulation, Scar mobilization, Vestibular training, Cryotherapy, and Moist heat  PLAN FOR NEXT SESSION: Lt shoulder ROM, thoracic mobility, try IASTM to scapula and shoulder, postural strength    Lorrene Reid, PT 07/15/23 10:22 AM   [facility] [address] [place], [place] [address] Phone # [phone] Fax [phone]

## 2023-07-17 ENCOUNTER — Ambulatory Visit: Payer: No Typology Code available for payment source

## 2023-07-17 DIAGNOSIS — G8929 Other chronic pain: Secondary | ICD-10-CM

## 2023-07-17 DIAGNOSIS — M25562 Pain in left knee: Secondary | ICD-10-CM | POA: Diagnosis not present

## 2023-07-17 DIAGNOSIS — M6281 Muscle weakness (generalized): Secondary | ICD-10-CM

## 2023-07-17 DIAGNOSIS — M25662 Stiffness of left knee, not elsewhere classified: Secondary | ICD-10-CM

## 2023-07-17 DIAGNOSIS — M25612 Stiffness of left shoulder, not elsewhere classified: Secondary | ICD-10-CM

## 2023-07-17 NOTE — Therapy (Signed)
 OUTPATIENT PHYSICAL THERAPY TREATMENT   Patient Name: Jesse Stewart MRN: [id number] DOB:1965/11/04, 58 y.o., male Today's Date: 07/17/2023  END OF SESSION:  PT End of Session - 07/17/23 1100     Visit Number 4    Date for PT Re-Evaluation 08/28/23    Authorization Type [facility]-15 visits approved 06/11/23-10/09/23    Authorization - Visit Number 4    Authorization - Number of Visits 15    PT Start Time 1017    PT Stop Time 1100    PT Time Calculation (min) 43 min    Activity Tolerance Patient tolerated treatment well    Behavior During Therapy [facility] for tasks assessed/performed                Past Medical History:  Diagnosis Date   Arthritis    Hypertension    Lung cancer (HCC) 03/13/2022   Past Surgical History:  Procedure Laterality Date   KNEE ARTHROSCOPY W/ MENISCAL REPAIR     LUNG REMOVAL, PARTIAL Right 03/27/2022   TOTAL KNEE ARTHROPLASTY Left 04/29/2023   Patient Active Problem List   Diagnosis Date Noted   Colon polyps 05/16/2019   Obesity (BMI 30.0-34.9) 05/13/2019   Chest tightness 10/06/2014   COVID 10/06/2014   Hematochezia 12/07/2012   Hernia of abdominal wall 12/07/2012   Well adult exam 04/23/2012   Neoplasm of uncertain behavior of skin 05/04/2010   ACTINIC KERATOSIS 05/04/2010   Dyslipidemia 04/04/2008   GERD 04/04/2008   Osteoarthritis 04/04/2008   KNEE PAIN 04/04/2008   ABNORMAL GLUCOSE NEC 04/04/2008   ABNORMAL LIVER FUNCTION TESTS 04/04/2008   Essential hypertension 12/29/2006    PCP: Jacinta Shoe, MD  REFERRING PROVIDER: Patrina Levering, MD  REFERRING DIAG: Lt frozen shoulder, Left TKA 04/29/2023   THERAPY DIAG:  Acute pain of left knee  Chronic left shoulder pain  Muscle weakness (generalized)  Stiffness of left shoulder, not elsewhere classified  Stiffness of left knee, not elsewhere classified  Rationale for Evaluation and Treatment: Rehabilitation  ONSET DATE: Lt TKA 04/29/23, Lt shoulder 3-4 month  history of pain and limited A/ROM  SUBJECTIVE:                                                                                                                                                                                      SUBJECTIVE STATEMENT: Lt shoulder feels 40% better since the start of care.  Less pain and more motion.  Hand dominance: Left  PERTINENT HISTORY: Lt TKA 04/29/23, Lung cancer with lung resection 03/2022   PAIN: 07/17/23 Are you having pain? Yes: NPRS scale: 0-7/10 Pain location: Lt shoulder  Pain description: brief, only when performing  aggravating activity  Aggravating factors: raising arm up or out, lifting in that position Relieving factors: lasts a few minutes after aggravating activity and reduces when stopping the aggravating activity  PRECAUTIONS: None  RED FLAGS: None   WEIGHT BEARING RESTRICTIONS: No  FALLS:  Has patient fallen in last 6 months? 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  DIAGNOSTIC FINDINGS:  MRI on 07/25/23: Lt shoulder, no result yet  PATIENT SURVEYS :  07/02/23: [name] 18.2%  06/11/23: FOTO: 69   COGNITION: Overall cognitive status: Within functional limits for tasks assessed     SENSATION: WFL  POSTURE: Forward head and rounded shoulder posture   UPPER EXTREMITY ROM:   Active ROM Right eval Left eval Left 07/15/23  Shoulder flexion 162 125* 137  Shoulder extension full 90* 105  Shoulder  abduction     Shoulder adduction     Shoulder internal rotation  Lacking 5" vs the Rt Lacking 5" vs the Rt   Shoulder external rotation  Lacking 3" vs Rt Lacking 2.5 inches vs Rt   Elbow flexion     Elbow extension     Wrist flexion     Wrist extension     Wrist ulnar deviation     Wrist radial deviation     Wrist pronation     Wrist supination     (Blank rows = not tested) P/ROM: Rt IR 25, ER 46, flexion 140 Lt knee 0-86  07/10/23: Lt knee P/ROM supine 84 degrees   UPPER EXTREMITY MMT:  MMT Right eval Left eval  Shoulder flexion 5/5 throughout  4-  Shoulder extension    Shoulder abduction  4-  Shoulder adduction    Shoulder internal rotation  4  Shoulder external rotation  4  Middle trapezius    Lower trapezius    Elbow flexion    Elbow extension    Wrist flexion    Wrist extension    Wrist ulnar deviation    Wrist radial deviation    Wrist pronation    Wrist supination    Grip strength (lbs)    (Blank rows = not tested)  JOINT MOBILITY TESTING:  Limited joint mobility of the Lt glenohumeral joint  PALPATION:  Palpable tenderness over Lt deltoid, posterior glenohumeral joint and proximal biceps tendon Limited PA mobility in the thoracic spine                                                                                                         TREATMENT DATE:  07/17/23:  Arm Bike: Level 1.3x 6 min (3/3)-PT present to  discuss  Pulleys: flexion x3 min, abduction x2 min, IR x2 min Leg press: seat 6 Bil 95# x25, Lt only 55# x25 Finger ladder: 1.5# added flexion and abduction x10 Bil shoulder extension and row with green band 2x10 bil each  Seated ER green band 2x10 Knee flexion on step x10 Manual: P/ROM: Lt shoulder flexion, ER, abduction with mobs   07/14/23:  Arm Bike: Level 1.3x 6 min (3/3)-PT present to discuss  Pulleys: flexion x3 min, abduction x2 min, IR x2 min Leg press: seat 6 Bil 100# x25, Lt only 60# x25 Finger ladder: flexion and abduction  x10 Bil shoulder extension and row with green band 2x10 bil each  Lt shoulder: 3 way raises: 2# 2x10 Seated ER green band 2x10 Knee flexion on step x10 Manual: P/ROM: Lt shoulder flexion, ER, abduction with mobs  07/10/23:  Arm Bike: Level 1.3x 6 min (3/3)-PT present to discuss  Pulleys: flexion x3 min, abduction x2 min, IR x2 min Supine flexion with dowel x10, ER x10 Finger ladder: flexion and abduction x10 Bil shoulder extension and row with green band 2x10 bil each  Knee flexion on step x10 Manual: P/ROM: Lt shoulder flexion, ER, abduction with mobs  07/03/23:  Findings from evaluation discussed, pt educated on plan of care, HEP initiated.    PATIENT EDUCATION: Education details: [email or web address]  Person educated: Patient Education method: Explanation, Demonstration, and Handouts Education comprehension: verbalized understanding and returned demonstration  HOME EXERCISE PROGRAM: For Knee: Access Code: [email or web address]  Access Code: [email or web address] URL: [email or web address] Date: 07/15/2023 Prepared by: Tresa Endo  Exercises  - Supine Shoulder Flexion Extension AAROM with Dowel  - 2-3 x daily - 7 x weekly - 1 sets - 5-10 reps - 10 hold - Supine Shoulder External Rotation with Dowel  - 2-3 x daily - 7 x weekly - 1 sets - 5-10 reps - 10 hold - Shoulder Flexion Wall Slide with Towel  - 2 x daily - 7 x weekly - 1 sets - 10 reps - 10 hold - Standing Shoulder Abduction Slides at Wall  - 2 x daily - 7 x weekly - 1 sets - 10 reps - 10 hold - Standing Shoulder Internal Rotation Stretch with Towel  - 2 x daily - 7 x weekly - 1 sets - 10 reps - 10 hold - Sidelying Open Book Thoracic Lumbar Rotation and Extension  - 2 x daily - 7 x weekly - 1 sets - 10 reps - Shoulder Extension with Resistance - Palms Forward  - 1 x daily - 7 x weekly - 2 sets - 10 reps - Standing Row with Anchored Resistance  - 1 x daily - 7 x weekly - 2 sets - 10 reps - Standing Shoulder External Rotation with Resistance  - 1 x  daily - 7 x weekly - 2 sets - 10 reps ASSESSMENT:  CLINICAL IMPRESSION: Pt reports 40% improvement in Lt shoulder pain since the start of care.  More ROM and reduced pain.  Pt did well with advancement of activity in the clinic today for both knee and shoulder.   Pt continues to work on knee flexion at home and seems to have reached a plateau with this. He was fatigued with 3 way raises and required minor cueing for posture and scapular depression.   Patient will benefit from skilled PT to address the below impairments and improve overall function.   OBJECTIVE IMPAIRMENTS: Abnormal gait, decreased activity tolerance, decreased balance, decreased mobility, difficulty walking, decreased ROM, decreased strength, hypomobility,  increased muscle spasms, impaired flexibility, impaired UE functional use, postural dysfunction, and pain.   ACTIVITY LIMITATIONS: carrying, lifting, squatting, stairs, transfers, reach over head, and locomotion level  PARTICIPATION LIMITATIONS: meal prep, cleaning, community activity, and occupation  PERSONAL FACTORS: 1-2 comorbidities: Rt TKA and lung cancer   are also affecting patient's functional outcome.   REHAB POTENTIAL: Good  CLINICAL DECISION MAKING: Stable/uncomplicated  EVALUATION COMPLEXITY: Low  GOALS: Goals reviewed with patient? Yes  SHORT TERM GOALS: Target date: 07/31/2023    Be independent in initial HEP Baseline: Goal status: INITIAL  2.  Demonstrate Lt shoulder A/ROM flexion to > or = to 135 degrees for overhead reaching  Baseline: 125 Goal status: INITIAL  3.  Demonstrate Lt shoulder IR to L3 to improve dressing and self care Baseline: Lateral glute on Lt and lacking 5" vs Rt Goal status: INITIAL  4.  Report < or = to 4/10 Lt shoulder pain with overhead reaching Baseline: 6/10 Goal status: INITIAL   LONG TERM GOALS: Target date: 08/28/2023    Be independent in advanced HEP for knee and shoulder  Baseline:  Goal status:  INITIAL  2.  Knee: Demonstrate Lt knee A/ROM flexion to > or = to 110 degrees to allow steps and squatting without compensation  Baseline:  Goal status: IN PROGRESS  3.  Knee: Improve strength to negotiate steps with step-over step gait with use of rail to facilitate return to work  Baseline:  Goal status: INITIAL  4.  Improve Lt shoulder A/ROM flexion to > or = to 150 degrees to improve overhead reaching  Baseline: 125 Goal status: INITIAL  5.  Improve DASH to < or = to 6% to improve functional use  Baseline: 18% Goal status: INITIAL  6.  Report < or = to 3/10 Lt shoulder pain with lifting and overhead use Baseline:  Goal status: INITIAL  PLAN: PT FREQUENCY: 2x/week  PT DURATION: 8 weeks  PLANNED INTERVENTIONS: 97110-Therapeutic exercises, 97530- Therapeutic activity, 97112- Neuromuscular re-education, 97535- Self Care, 04540- Manual therapy, [phone]- Gait training, [phone]- Aquatic Therapy, [phone]- Electrical stimulation (unattended), [phone]- Electrical stimulation (manual), [id number]- Vasopneumatic device, [id number]- Ultrasound, [id number]- Traction (mechanical), [id number]- Ionotophoresis 4mg /ml Dexamethasone, Patient/Family education, Stair training, Taping, Dry Needling, Joint mobilization, Joint manipulation, Scar mobilization, Vestibular training, Cryotherapy, and Moist heat  PLAN FOR NEXT SESSION: Lt shoulder ROM, thoracic mobility, try IASTM to scapula and shoulder, postural strength    Lorrene Reid, PT 07/17/23 11:02 AM   [facility] [address], [address] [place], [place] [address] Phone # [phone] Fax [phone]

## 2023-07-22 ENCOUNTER — Ambulatory Visit: Payer: No Typology Code available for payment source

## 2023-07-22 DIAGNOSIS — M25562 Pain in left knee: Secondary | ICD-10-CM | POA: Diagnosis not present

## 2023-07-22 DIAGNOSIS — M25612 Stiffness of left shoulder, not elsewhere classified: Secondary | ICD-10-CM

## 2023-07-22 DIAGNOSIS — M25662 Stiffness of left knee, not elsewhere classified: Secondary | ICD-10-CM

## 2023-07-22 DIAGNOSIS — M6281 Muscle weakness (generalized): Secondary | ICD-10-CM

## 2023-07-22 DIAGNOSIS — G8929 Other chronic pain: Secondary | ICD-10-CM

## 2023-07-22 NOTE — Therapy (Signed)
 OUTPATIENT PHYSICAL THERAPY TREATMENT   Patient Name: Jesse Stewart MRN: [id number] DOB:Feb 14, 1966, 58 y.o., male Today's Date: 07/22/2023  END OF SESSION:  PT End of Session - 07/22/23 1059     Visit Number 5    Date for PT Re-Evaluation 08/28/23    Authorization Type VA-15 visits approved 06/11/23-10/09/23    Authorization - Visit Number 5    Authorization - Number of Visits 15    PT Start Time 1016    PT Stop Time 1059    PT Time Calculation (min) 43 min    Activity Tolerance Patient tolerated treatment well    Behavior During Therapy [facility] for tasks assessed/performed                 Past Medical History:  Diagnosis Date   Arthritis    Hypertension    Lung cancer (HCC) 03/13/2022   Past Surgical History:  Procedure Laterality Date   KNEE ARTHROSCOPY W/ MENISCAL REPAIR     LUNG REMOVAL, PARTIAL Right 03/27/2022   TOTAL KNEE ARTHROPLASTY Left 04/29/2023   Patient Active Problem List   Diagnosis Date Noted   Colon polyps 05/16/2019   Obesity (BMI 30.0-34.9) 05/13/2019   Chest tightness 10/06/2014   COVID 10/06/2014   Hematochezia 12/07/2012   Hernia of abdominal wall 12/07/2012   Well adult exam 04/23/2012   Neoplasm of uncertain behavior of skin 05/04/2010   ACTINIC KERATOSIS 05/04/2010   Dyslipidemia 04/04/2008   GERD 04/04/2008   Osteoarthritis 04/04/2008   KNEE PAIN 04/04/2008   ABNORMAL GLUCOSE NEC 04/04/2008   ABNORMAL LIVER FUNCTION TESTS 04/04/2008   Essential hypertension 12/29/2006    PCP: Jacinta Shoe, MD  REFERRING PROVIDER: Patrina Levering, MD  REFERRING DIAG: Lt frozen shoulder, Left TKA 04/29/2023   THERAPY DIAG:  Acute pain of left knee  Chronic left shoulder pain  Muscle weakness (generalized)  Stiffness of left shoulder, not elsewhere classified  Stiffness of left knee, not elsewhere classified  Rationale for Evaluation and Treatment: Rehabilitation  ONSET DATE: Lt TKA 04/29/23, Lt shoulder 3-4 month  history of pain and limited A/ROM  SUBJECTIVE:                                                                                                                                                                                      SUBJECTIVE STATEMENT: I'm using pulleys multiple times a day.  I am 40-50% better with my shoulder. Still hard to reach behind back.  I'm walking a lot outside.   Hand dominance: Left  PERTINENT HISTORY: Lt TKA 04/29/23, Lung cancer with lung resection 03/2022   PAIN: 07/22/23 Are you having pain? Yes: NPRS  scale: 0-5/10 Pain location: Lt shoulder  Pain description: brief, only when performing aggravating activity  Aggravating factors: reaching behind the back, sometimes reaching out Relieving factors: lasts a few minutes after aggravating activity and reduces when stopping the aggravating activity  PRECAUTIONS: None  RED FLAGS: None   WEIGHT BEARING RESTRICTIONS: No  FALLS:  Has patient fallen in last 6 months? 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  DIAGNOSTIC FINDINGS:  MRI on 07/25/23: Lt shoulder, no result yet  PATIENT SURVEYS :  07/02/23: [name] 18.2%  06/11/23: FOTO: 69   COGNITION: Overall cognitive status: Within functional limits for tasks assessed     SENSATION: WFL  POSTURE: Forward head and rounded shoulder posture   UPPER EXTREMITY ROM:   Active ROM Right eval Left eval Left 07/15/23 Left 07/22/23   Shoulder flexion 162 125* 137 144  Shoulder extension full 90* 105 105  Shoulder abduction      Shoulder adduction      Shoulder internal rotation  Lacking 5" vs the Rt Lacking 5" vs the Rt  To L2  Shoulder external rotation  Lacking 3" vs Rt Lacking 2.5 inches vs Rt  ER lacking 2.5 inches   Elbow flexion      Elbow extension      Wrist flexion      Wrist extension      Wrist ulnar deviation      Wrist radial deviation      Wrist pronation      Wrist supination      (Blank rows = not tested) P/ROM: Rt IR 25, ER 46, flexion 140 Lt knee 0-86  07/10/23: Lt knee P/ROM supine 84 degrees  07/22/23: Lt knee flexion: 95 degrees   UPPER EXTREMITY MMT:  MMT Right eval Left eval  Shoulder flexion 5/5 throughout  4-  Shoulder extension    Shoulder abduction  4-  Shoulder adduction    Shoulder internal rotation  4  Shoulder external rotation  4  Middle trapezius    Lower trapezius    Elbow flexion    Elbow extension    Wrist flexion    Wrist extension    Wrist ulnar deviation    Wrist radial deviation    Wrist pronation    Wrist supination    Grip strength (lbs)    (Blank rows = not tested)  JOINT MOBILITY TESTING:  Limited joint mobility of the Lt glenohumeral joint  PALPATION:  Palpable tenderness over Lt deltoid, posterior glenohumeral joint and proximal biceps tendon Limited PA mobility in the thoracic spine  TREATMENT DATE:   07/22/23:  Arm Bike: Level 1.3x 6 min (3/3)-PT present to discuss  Leg press: seat 6 Bil 95# x25, Lt only 55# x25 Finger ladder: 1.5# added flexion and abduction 2x10 each Shoulder 3 way raises: 2x10 on Lt  Sidelying ER: 2# x20 Serratus punches: 4# x20 Knee flexion on step x10 Manual: P/ROM: Lt shoulder flexion, ER, abduction with mobs  07/17/23:  Arm Bike: Level 1.3x 6 min (3/3)-PT present to discuss  Pulleys: flexion x3 min, abduction x2  min, IR x2 min Leg press: seat 6 Bil 95# x25, Lt only 55# x25 Finger ladder: 1.5# added flexion and abduction x10 Bil shoulder extension and row with green band 2x10 bil each  Seated ER green band 2x10 Knee flexion on step x10 Manual: P/ROM: Lt shoulder flexion, ER, abduction with mobs   07/14/23:  Arm Bike: Level 1.3x 6 min (3/3)-PT present to discuss  Pulleys: flexion x3 min, abduction x2 min, IR x2 min Leg press: seat 6 Bil 100# x25, Lt only 60# x25 Finger ladder: flexion and abduction x10 Bil shoulder extension and row with green band 2x10 bil each  Lt shoulder: 3 way raises: 2# 2x10 Seated ER green band 2x10 Knee flexion on step x10 Manual: P/ROM: Lt shoulder flexion, ER, abduction with mobs   PATIENT EDUCATION: Education details: [email or web address]  Person educated: Patient Education method: Explanation, Demonstration, and Handouts Education comprehension: verbalized understanding and returned demonstration  HOME EXERCISE PROGRAM: For Knee: Access Code: [email or web address]  Access Code: [email or web address] URL: [email or web address] Date: 07/15/2023 Prepared by: Tresa Endo  Exercises  - Supine Shoulder Flexion Extension AAROM with Dowel  - 2-3 x daily - 7 x weekly - 1 sets - 5-10 reps - 10 hold - Supine Shoulder External Rotation with Dowel  - 2-3 x daily - 7 x weekly - 1 sets - 5-10 reps - 10 hold - Shoulder Flexion Wall Slide with Towel  - 2 x daily - 7 x weekly - 1 sets - 10 reps - 10 hold - Standing Shoulder Abduction Slides at Wall  - 2 x daily - 7 x weekly - 1 sets - 10 reps - 10 hold - Standing Shoulder Internal Rotation Stretch with Towel  - 2 x daily - 7 x weekly - 1 sets - 10 reps - 10 hold - Sidelying Open Book Thoracic Lumbar Rotation and Extension  - 2 x daily - 7 x weekly - 1 sets - 10 reps - Shoulder Extension with Resistance - Palms Forward  - 1 x daily - 7 x weekly - 2 sets - 10 reps - Standing Row with Anchored Resistance  - 1 x daily - 7 x weekly - 2 sets - 10 reps -  Standing Shoulder External Rotation with Resistance  - 1 x daily - 7 x weekly - 2 sets - 10 reps ASSESSMENT:  CLINICAL IMPRESSION: Pt reports 40-50% improvement in Lt shoulder pain since the start of care.  He reports significant decrease in pain with reaching overhead and out.  Pt did well with advancement of activity in the clinic today for both knee and shoulder again.   Pt continues to work on knee flexion at home and improved flexion to 95 today. He was fatigued with 3 way raises and required minor cueing for posture and scapular depression.   Patient will benefit from skilled PT to address the below impairments and improve overall function.   OBJECTIVE IMPAIRMENTS: Abnormal gait, decreased activity tolerance, decreased balance, decreased mobility, difficulty walking, decreased  ROM, decreased strength, hypomobility, increased muscle spasms, impaired flexibility, impaired UE functional use, postural dysfunction, and pain.   ACTIVITY LIMITATIONS: carrying, lifting, squatting, stairs, transfers, reach over head, and locomotion level  PARTICIPATION LIMITATIONS: meal prep, cleaning, community activity, and occupation  PERSONAL FACTORS: 1-2 comorbidities: Rt TKA and lung cancer   are also affecting patient's functional outcome.   REHAB POTENTIAL: Good  CLINICAL DECISION MAKING: Stable/uncomplicated  EVALUATION COMPLEXITY: Low  GOALS: Goals reviewed with patient? Yes  SHORT TERM GOALS: Target date: 07/31/2023    Be independent in initial HEP Baseline: Goal status: MET  2.  Demonstrate Lt shoulder A/ROM flexion to > or = to 135 degrees for overhead reaching  Baseline: 144 (07/22/23) Goal status: MET  3.  Demonstrate Lt shoulder IR to L3 to improve dressing and self care Baseline: Lateral glute on Lt and lacking 5" vs Rt Goal status: INITIAL  4.  Report < or = to 4/10 Lt shoulder pain with overhead reaching Baseline: 5/10 Goal status: IN PROGRESS   LONG TERM GOALS: Target date:  08/28/2023    Be independent in advanced HEP for knee and shoulder  Baseline:  Goal status: INITIAL  2.  Knee: Demonstrate Lt knee A/ROM flexion to > or = to 110 degrees to allow steps and squatting without compensation  Baseline:  Goal status: IN PROGRESS  3.  Knee: Improve strength to negotiate steps with step-over step gait with use of rail to facilitate return to work  Baseline:  Goal status: INITIAL  4.  Improve Lt shoulder A/ROM flexion to > or = to 150 degrees to improve overhead reaching  Baseline: 144 (07/22/23) Goal status: INITIAL  5.  Improve DASH to < or = to 6% to improve functional use  Baseline: 18% Goal status: INITIAL  6.  Report < or = to 3/10 Lt shoulder pain with lifting and overhead use Baseline:  Goal status: INITIAL  PLAN: PT FREQUENCY: 2x/week  PT DURATION: 8 weeks  PLANNED INTERVENTIONS: 97110-Therapeutic exercises, 97530- Therapeutic activity, 97112- Neuromuscular re-education, 97535- Self Care, 16109- Manual therapy, [phone]- Gait training, [phone]- Aquatic Therapy, [phone]- Electrical stimulation (unattended), [phone]- Electrical stimulation (manual), 97016- Vasopneumatic device, [id number]- Ultrasound, [id number]- Traction (mechanical), [id number]- Ionotophoresis 4mg /ml Dexamethasone, Patient/Family education, Stair training, Taping, Dry Needling, Joint mobilization, Joint manipulation, Scar mobilization, Vestibular training, Cryotherapy, and Moist heat  PLAN FOR NEXT SESSION: Lt shoulder ROM, thoracic mobility, try IASTM to scapula and shoulder, postural strength, Lt knee strength and flexibility    Lorrene Reid, PT 07/22/23 10:59 AM   [facility] [address], [address] [place], [place] [address] Phone # [phone] Fax [phone]

## 2023-07-24 ENCOUNTER — Ambulatory Visit: Payer: No Typology Code available for payment source

## 2023-07-24 DIAGNOSIS — M25662 Stiffness of left knee, not elsewhere classified: Secondary | ICD-10-CM

## 2023-07-24 DIAGNOSIS — M25562 Pain in left knee: Secondary | ICD-10-CM

## 2023-07-24 DIAGNOSIS — G8929 Other chronic pain: Secondary | ICD-10-CM

## 2023-07-24 DIAGNOSIS — M25612 Stiffness of left shoulder, not elsewhere classified: Secondary | ICD-10-CM

## 2023-07-24 DIAGNOSIS — M6281 Muscle weakness (generalized): Secondary | ICD-10-CM

## 2023-07-24 NOTE — Therapy (Signed)
 OUTPATIENT PHYSICAL THERAPY TREATMENT   Patient Name: Jesse Stewart MRN: [id number] DOB:Apr 15, 1966, 58 y.o., male Today's Date: 07/24/2023  END OF SESSION:  PT End of Session - 07/24/23 1101     Visit Number 6    Date for PT Re-Evaluation 08/28/23    Authorization Type [employer]-15 visits approved 06/11/23-10/09/23    Authorization - Visit Number 6    Authorization - Number of Visits 15    PT Start Time 1017    PT Stop Time 1100    PT Time Calculation (min) 43 min    Activity Tolerance Patient tolerated treatment well    Behavior During Therapy [facility] for tasks assessed/performed                  Past Medical History:  Diagnosis Date   Arthritis    Hypertension    Lung cancer (HCC) 03/13/2022   Past Surgical History:  Procedure Laterality Date   KNEE ARTHROSCOPY W/ MENISCAL REPAIR     LUNG REMOVAL, PARTIAL Right 03/27/2022   TOTAL KNEE ARTHROPLASTY Left 04/29/2023   Patient Active Problem List   Diagnosis Date Noted   Colon polyps 05/16/2019   Obesity (BMI 30.0-34.9) 05/13/2019   Chest tightness 10/06/2014   COVID 10/06/2014   Hematochezia 12/07/2012   Hernia of abdominal wall 12/07/2012   Well adult exam 04/23/2012   Neoplasm of uncertain behavior of skin 05/04/2010   ACTINIC KERATOSIS 05/04/2010   Dyslipidemia 04/04/2008   GERD 04/04/2008   Osteoarthritis 04/04/2008   KNEE PAIN 04/04/2008   ABNORMAL GLUCOSE NEC 04/04/2008   ABNORMAL LIVER FUNCTION TESTS 04/04/2008   Essential hypertension 12/29/2006    PCP: Jacinta Shoe, MD  REFERRING PROVIDER: Patrina Levering, MD  REFERRING DIAG: Lt frozen shoulder, Left TKA 04/29/2023   THERAPY DIAG:  Acute pain of left knee  Chronic left shoulder pain  Muscle weakness (generalized)  Stiffness of left shoulder, not elsewhere classified  Stiffness of left knee, not elsewhere classified  Rationale for Evaluation and Treatment: Rehabilitation  ONSET DATE: Lt TKA 04/29/23, Lt shoulder 3-4 month  history of pain and limited A/ROM  SUBJECTIVE:                                                                                                                                                                                      SUBJECTIVE STATEMENT: I'm using pulleys multiple times a day.  I am 40-50% better with my shoulder. I got my MRI results-partial tears in rotator cuff and moderate OA.  Will see orthopedic MD next week.  Hand dominance: Left  PERTINENT HISTORY: Lt TKA 04/29/23, Lung cancer with lung resection 03/2022   PAIN:  07/22/23 Are you having pain? Yes: NPRS scale: 1/10 Pain location: Lt shoulder  Pain description: brief, only when performing aggravating activity  Aggravating factors: reaching behind the back, sometimes reaching out Relieving factors: lasts a few minutes after aggravating activity and reduces when stopping the aggravating activity  PRECAUTIONS: None  RED FLAGS: None   WEIGHT BEARING RESTRICTIONS: No  FALLS:  Has patient fallen in last 6 months? 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  DIAGNOSTIC FINDINGS:  MRI on 07/25/23: Lt shoulder, no result yet  PATIENT SURVEYS :  07/02/23: [name] 18.2%  06/11/23: FOTO: 69   COGNITION: Overall cognitive status: Within functional limits for tasks assessed     SENSATION: WFL  POSTURE: Forward head and rounded shoulder posture   UPPER EXTREMITY ROM:   Active ROM Right eval  Left eval Left 07/15/23 Left 07/22/23  Shoulder flexion 162 125* 137 144  Shoulder extension full 90* 105 105  Shoulder abduction      Shoulder adduction      Shoulder internal rotation  Lacking 5" vs the Rt Lacking 5" vs the Rt  To L2  Shoulder external rotation  Lacking 3" vs Rt Lacking 2.5 inches vs Rt  ER lacking 2.5 inches   Elbow flexion      Elbow extension      Wrist flexion      Wrist extension      Wrist ulnar deviation      Wrist radial deviation      Wrist pronation      Wrist supination      (Blank rows = not tested) P/ROM: Rt IR 25, ER 46, flexion 140 Lt knee 0-86  07/10/23: Lt knee P/ROM supine 84 degrees  07/22/23: Lt knee flexion: 95 degrees   UPPER EXTREMITY MMT:  MMT Right eval Left eval  Shoulder flexion 5/5 throughout  4-  Shoulder extension    Shoulder abduction  4-  Shoulder adduction    Shoulder internal rotation  4  Shoulder external rotation  4  Middle trapezius    Lower trapezius    Elbow flexion    Elbow extension    Wrist flexion    Wrist extension    Wrist ulnar deviation    Wrist radial deviation    Wrist pronation    Wrist supination    Grip strength (lbs)    (Blank rows = not tested)  JOINT MOBILITY TESTING:  Limited joint mobility of the Lt glenohumeral joint  PALPATION:  Palpable tenderness over Lt deltoid, posterior glenohumeral joint and proximal biceps tendon Limited PA mobility in the thoracic spine  TREATMENT DATE:    07/24/23:  Arm Bike: Level 2.0 x 6 min (3/3)-PT present to discuss  Leg press: seat 6 Bil 100# x25, Lt only 60# x25 Finger ladder: 1.5# added flexion and abduction 2x10 each Shoulder 3 way raises: 2# 2x10 on Lt  Ball roll outs: 3 ways x10  Sidelying ER: 3# x20 Serratus punches: 5# x20 Knee flexion on step x10 Manual: P/ROM: Lt shoulder flexion, ER, abduction with mobs  07/22/23:  Arm Bike: Level  1.3x 6 min (3/3)-PT present to discuss  Leg press: seat 6 Bil 95# x25, Lt only 55# x25 Finger ladder: 1.5# added flexion and abduction 2x10 each Shoulder 3 way raises: 2x10 on Lt  Sidelying ER: 2# x20 Serratus punches: 4# x20 Knee flexion on step x10 Manual: P/ROM: Lt shoulder flexion, ER, abduction with mobs  07/17/23:  Arm Bike: Level 1.3x 6 min (3/3)-PT present to discuss  Pulleys: flexion x3 min, abduction x2 min, IR x2 min Leg press: seat 6 Bil 95# x25, Lt only 55# x25 Finger ladder: 1.5# added flexion and abduction x10 Bil shoulder extension and row with green band 2x10 bil each  Seated ER green band 2x10 Knee flexion on step x10 Manual: P/ROM: Lt shoulder flexion, ER, abduction with mobs  PATIENT EDUCATION: Education details: [email or web address]  Person educated: Patient Education method: Explanation, Demonstration, and Handouts Education comprehension: verbalized understanding and returned demonstration  HOME EXERCISE PROGRAM: For Knee: Access Code: [email or web address]  Access Code: [email or web address] URL: [email or web address] Date: 07/15/2023 Prepared by: Tresa Endo  Exercises  - Supine Shoulder Flexion Extension AAROM with Dowel  - 2-3 x daily - 7 x weekly - 1 sets - 5-10 reps - 10 hold - Supine Shoulder External Rotation with Dowel  - 2-3 x daily - 7 x weekly - 1 sets - 5-10 reps - 10 hold - Shoulder Flexion Wall Slide with Towel  - 2 x daily - 7 x weekly - 1 sets - 10 reps - 10 hold - Standing Shoulder Abduction Slides at Wall  - 2 x daily - 7 x weekly - 1 sets - 10 reps - 10 hold - Standing Shoulder Internal Rotation Stretch with Towel  - 2 x daily - 7 x weekly - 1 sets - 10 reps - 10 hold - Sidelying Open Book Thoracic Lumbar Rotation and Extension  - 2 x daily - 7 x weekly - 1 sets - 10 reps - Shoulder Extension with Resistance - Palms Forward  - 1 x daily - 7 x weekly - 2 sets - 10 reps - Standing Row with Anchored Resistance  - 1 x daily - 7 x weekly - 2 sets - 10 reps - Standing  Shoulder External Rotation with Resistance  - 1 x daily - 7 x weekly - 2 sets - 10 reps ASSESSMENT:  CLINICAL IMPRESSION: Pt continues to report 40-50% improvement in Lt shoulder pain since the start of care.  He reports significant decrease in pain with reaching overhead and out.Marland Kitchen  He did well with increased resistance on the arm bike and added weight on the leg press. Reduced fatigue with 3 way raises and improved scapular depression.  Lt knee is flexing more with swing through phase of gait which helps to normalize gait.  Patient will benefit from skilled PT to address the below impairments and improve overall function.   OBJECTIVE IMPAIRMENTS: Abnormal gait, decreased activity tolerance, decreased balance, decreased mobility, difficulty walking, decreased ROM, decreased strength, hypomobility, increased muscle spasms, impaired flexibility, impaired UE functional  use, postural dysfunction, and pain.   ACTIVITY LIMITATIONS: carrying, lifting, squatting, stairs, transfers, reach over head, and locomotion level  PARTICIPATION LIMITATIONS: meal prep, cleaning, community activity, and occupation  PERSONAL FACTORS: 1-2 comorbidities: Rt TKA and lung cancer   are also affecting patient's functional outcome.   REHAB POTENTIAL: Good  CLINICAL DECISION MAKING: Stable/uncomplicated  EVALUATION COMPLEXITY: Low  GOALS: Goals reviewed with patient? Yes  SHORT TERM GOALS: Target date: 07/31/2023    Be independent in initial HEP Baseline: Goal status: MET  2.  Demonstrate Lt shoulder A/ROM flexion to > or = to 135 degrees for overhead reaching  Baseline: 144 (07/22/23) Goal status: MET  3.  Demonstrate Lt shoulder IR to L3 to improve dressing and self care Baseline: L2 Goal status: INITIAL  4.  Report < or = to 4/10 Lt shoulder pain with overhead reaching Baseline: 5/10 Goal status: IN PROGRESS   LONG TERM GOALS: Target date: 08/28/2023    Be independent in advanced HEP for knee and  shoulder  Baseline:  Goal status: INITIAL  2.  Knee: Demonstrate Lt knee A/ROM flexion to > or = to 110 degrees to allow steps and squatting without compensation  Baseline:  Goal status: IN PROGRESS  3.  Knee: Improve strength to negotiate steps with step-over step gait with use of rail to facilitate return to work  Baseline:  Goal status: INITIAL  4.  Improve Lt shoulder A/ROM flexion to > or = to 150 degrees to improve overhead reaching  Baseline: 144 (07/22/23) Goal status: INITIAL  5.  Improve DASH to < or = to 6% to improve functional use  Baseline: 18% Goal status: INITIAL  6.  Report < or = to 3/10 Lt shoulder pain with lifting and overhead use Baseline:  Goal status: INITIAL  PLAN: PT FREQUENCY: 2x/week  PT DURATION: 8 weeks  PLANNED INTERVENTIONS: 97110-Therapeutic exercises, 97530- Therapeutic activity, 97112- Neuromuscular re-education, 97535- Self Care, 16109- Manual therapy, [phone]- Gait training, [phone]- Aquatic Therapy, [phone]- Electrical stimulation (unattended), [phone]- Electrical stimulation (manual), [id number]- Vasopneumatic device, [id number]- Ultrasound, [id number]- Traction (mechanical), [id number]- Ionotophoresis 4mg /ml Dexamethasone, Patient/Family education, Stair training, Taping, Dry Needling, Joint mobilization, Joint manipulation, Scar mobilization, Vestibular training, Cryotherapy, and Moist heat  PLAN FOR NEXT SESSION: Lt shoulder ROM, strength, thoracic mobility, Lt knee strength and flexibility    Lorrene Reid, PT 07/24/23 11:02 AM   [facility] [address], [address] [place], [place] [address] Phone # [phone] Fax [phone]

## 2023-07-29 ENCOUNTER — Ambulatory Visit: Payer: No Typology Code available for payment source

## 2023-07-29 DIAGNOSIS — M25562 Pain in left knee: Secondary | ICD-10-CM

## 2023-07-29 DIAGNOSIS — G8929 Other chronic pain: Secondary | ICD-10-CM

## 2023-07-29 DIAGNOSIS — R2689 Other abnormalities of gait and mobility: Secondary | ICD-10-CM

## 2023-07-29 DIAGNOSIS — M25662 Stiffness of left knee, not elsewhere classified: Secondary | ICD-10-CM

## 2023-07-29 DIAGNOSIS — M25612 Stiffness of left shoulder, not elsewhere classified: Secondary | ICD-10-CM

## 2023-07-29 DIAGNOSIS — M6281 Muscle weakness (generalized): Secondary | ICD-10-CM

## 2023-07-29 NOTE — Therapy (Signed)
 OUTPATIENT PHYSICAL THERAPY TREATMENT   Patient Name: Jesse Stewart MRN: [id number] DOB:04/15/1966, 58 y.o., male Today's Date: 07/29/2023  END OF SESSION:  PT End of Session - 07/29/23 1101     Visit Number 7    Date for PT Re-Evaluation 08/28/23    Authorization Type VA-15 visits approved 06/11/23-10/09/23    Authorization - Visit Number 7    Authorization - Number of Visits 15    PT Start Time 1017    PT Stop Time 1100    PT Time Calculation (min) 43 min    Activity Tolerance Patient tolerated treatment well    Behavior During Therapy [facility] for tasks assessed/performed                   Past Medical History:  Diagnosis Date   Arthritis    Hypertension    Lung cancer (HCC) 03/13/2022   Past Surgical History:  Procedure Laterality Date   KNEE ARTHROSCOPY W/ MENISCAL REPAIR     LUNG REMOVAL, PARTIAL Right 03/27/2022   TOTAL KNEE ARTHROPLASTY Left 04/29/2023   Patient Active Problem List   Diagnosis Date Noted   Colon polyps 05/16/2019   Obesity (BMI 30.0-34.9) 05/13/2019   Chest tightness 10/06/2014   COVID 10/06/2014   Hematochezia 12/07/2012   Hernia of abdominal wall 12/07/2012   Well adult exam 04/23/2012   Neoplasm of uncertain behavior of skin 05/04/2010   ACTINIC KERATOSIS 05/04/2010   Dyslipidemia 04/04/2008   GERD 04/04/2008   Osteoarthritis 04/04/2008   KNEE PAIN 04/04/2008   ABNORMAL GLUCOSE NEC 04/04/2008   ABNORMAL LIVER FUNCTION TESTS 04/04/2008   Essential hypertension 12/29/2006    PCP: Jacinta Shoe, MD  REFERRING PROVIDER: Patrina Levering, MD  REFERRING DIAG: Lt frozen shoulder, Left TKA 04/29/2023   THERAPY DIAG:  Acute pain of left knee  Chronic left shoulder pain  Muscle weakness (generalized)  Stiffness of left shoulder, not elsewhere classified  Stiffness of left knee, not elsewhere classified  Other abnormalities of gait and mobility  Rationale for Evaluation and Treatment: Rehabilitation  ONSET  DATE: Lt TKA 04/29/23, Lt shoulder 3-4 month history of pain and limited A/ROM  SUBJECTIVE:                                                                                                                                                                                      SUBJECTIVE STATEMENT: I'm using pulleys multiple times a day.  I am 55% better with my shoulder symptoms. I got my MRI results-partial tears in rotator cuff and moderate OA.  Will see orthopedic MD next week.  Hand dominance: Left  PERTINENT HISTORY: Lt TKA 04/29/23,  Lung cancer with lung resection 03/2022   PAIN: 07/29/23 Are you having pain? Yes: NPRS scale: 1/10 Pain location: Lt shoulder  Pain description: brief, only when performing aggravating activity  Aggravating factors: reaching behind the back, sometimes reaching out Relieving factors: lasts a few minutes after aggravating activity and reduces when stopping the aggravating activity  PRECAUTIONS: None  RED FLAGS: None   WEIGHT BEARING RESTRICTIONS: No  FALLS:  Has patient fallen in last 6 months? 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  DIAGNOSTIC FINDINGS:  MRI on 07/25/23: Lt shoulder, no result yet  PATIENT SURVEYS :  07/02/23: [name] 18.2%  06/11/23: FOTO: 69   COGNITION: Overall cognitive status: Within functional limits for tasks assessed     SENSATION: WFL  POSTURE: Forward head and rounded shoulder posture    UPPER EXTREMITY ROM:   Active ROM Right eval Left eval Left 07/15/23 Left 07/22/23  Shoulder flexion 162 125* 137 144  Shoulder extension full 90* 105 105  Shoulder abduction      Shoulder adduction      Shoulder internal rotation  Lacking 5" vs the Rt Lacking 5" vs the Rt  To L2  Shoulder external rotation  Lacking 3" vs Rt Lacking 2.5 inches vs Rt  ER lacking 2.5 inches   Elbow flexion      Elbow extension      Wrist flexion      Wrist extension      Wrist ulnar deviation      Wrist radial deviation      Wrist pronation      Wrist supination      (Blank rows = not tested) P/ROM: Rt IR 25, ER 46, flexion 140 Lt knee 0-86  07/10/23: Lt knee P/ROM supine 84 degrees  07/22/23: Lt knee flexion: 95 degrees   UPPER EXTREMITY MMT:  MMT Right eval Left eval  Shoulder flexion 5/5 throughout  4-  Shoulder extension    Shoulder abduction  4-  Shoulder adduction    Shoulder internal rotation  4  Shoulder external rotation  4  Middle trapezius    Lower trapezius    Elbow flexion    Elbow extension    Wrist flexion    Wrist extension    Wrist ulnar deviation    Wrist radial deviation    Wrist pronation    Wrist supination    Grip strength (lbs)    (Blank rows = not tested)  JOINT MOBILITY TESTING:  Limited joint mobility of the Lt glenohumeral joint  PALPATION:  Palpable tenderness over Lt deltoid, posterior glenohumeral joint and proximal biceps tendon Limited PA mobility in the thoracic spine  TREATMENT DATE:   07/29/23:  Arm Bike: Level 2.0 x 6 min (3/3)-PT present to discuss  Leg press: seat 6 Bil 100# x25, Lt only 60# x25 Finger ladder: 1.5# added flexion and abduction 2x10 each Shoulder 3 way raises: 3# 2x10 on Lt  Ball roll outs: 3 ways x10  Sidelying ER: 3# x20 Serratus punches: 5# x20 Knee flexion on step x10 Manual: P/ROM: Lt shoulder flexion, ER,  abduction with mobs  07/24/23:  Arm Bike: Level 2.0 x 6 min (3/3)-PT present to discuss  Leg press: seat 6 Bil 100# x25, Lt only 60# x25 Finger ladder: 1.5# added flexion and abduction 2x10 each Shoulder 3 way raises: 2# 2x10 on Lt  Ball roll outs: 3 ways x10  Sidelying ER: 3# x20 Serratus punches: 5# x20 Knee flexion on step x10 Manual: P/ROM: Lt shoulder flexion, ER, abduction with mobs  07/22/23:  Arm Bike: Level 1.3x 6 min (3/3)-PT present to discuss  Leg press: seat 6 Bil 95# x25, Lt only 55# x25 Finger ladder: 1.5# added flexion and abduction 2x10 each Shoulder 3 way raises: 2x10 on Lt  Sidelying ER: 2# x20 Serratus punches: 4# x20 Knee flexion on step x10 Manual: P/ROM: Lt shoulder flexion, ER, abduction with mobs   PATIENT EDUCATION: Education details: [email or web address]  Person educated: Patient Education method: Explanation, Demonstration, and Handouts Education comprehension: verbalized understanding and returned demonstration  HOME EXERCISE PROGRAM: For Knee: Access Code: [email or web address]  Access Code: [email or web address] URL: [email or web address] Date: 07/15/2023 Prepared by: Tresa Endo  Exercises  - Supine Shoulder Flexion Extension AAROM with Dowel  - 2-3 x daily - 7 x weekly - 1 sets - 5-10 reps - 10 hold - Supine Shoulder External Rotation with Dowel  - 2-3 x daily - 7 x weekly - 1 sets - 5-10 reps - 10 hold - Shoulder Flexion Wall Slide with Towel  - 2 x daily - 7 x weekly - 1 sets - 10 reps - 10 hold - Standing Shoulder Abduction Slides at Wall  - 2 x daily - 7 x weekly - 1 sets - 10 reps - 10 hold - Standing Shoulder Internal Rotation Stretch with Towel  - 2 x daily - 7 x weekly - 1 sets - 10 reps - 10 hold - Sidelying Open Book Thoracic Lumbar Rotation and Extension  - 2 x daily - 7 x weekly - 1 sets - 10 reps - Shoulder Extension with Resistance - Palms Forward  - 1 x daily - 7 x weekly - 2 sets - 10 reps - Standing Row with Anchored Resistance  - 1 x daily - 7 x  weekly - 2 sets - 10 reps - Standing Shoulder External Rotation with Resistance  - 1 x daily - 7 x weekly - 2 sets - 10 reps ASSESSMENT:  CLINICAL IMPRESSION: Pt continues to report 55% improvement in Lt shoulder pain since the start of care. He did well with increased weight with 3 way raises today.  Lt knee is flexing more with swing through phase of gait which helps to normalize gait and he is able to sit with knee at 90 degrees with increased comfort.  PT monitored throughout session for technique and pain.  Patient will benefit from skilled PT to address the below impairments and improve overall function.   OBJECTIVE IMPAIRMENTS: Abnormal gait, decreased activity tolerance, decreased balance, decreased mobility, difficulty walking, decreased ROM, decreased strength, hypomobility, increased muscle spasms, impaired flexibility, impaired UE functional use, postural dysfunction, and pain.  ACTIVITY LIMITATIONS: carrying, lifting, squatting, stairs, transfers, reach over head, and locomotion level  PARTICIPATION LIMITATIONS: meal prep, cleaning, community activity, and occupation  PERSONAL FACTORS: 1-2 comorbidities: Rt TKA and lung cancer   are also affecting patient's functional outcome.   REHAB POTENTIAL: Good  CLINICAL DECISION MAKING: Stable/uncomplicated  EVALUATION COMPLEXITY: Low  GOALS: Goals reviewed with patient? Yes  SHORT TERM GOALS: Target date: 07/31/2023    Be independent in initial HEP Baseline: Goal status: MET  2.  Demonstrate Lt shoulder A/ROM flexion to > or = to 135 degrees for overhead reaching  Baseline: 144 (07/22/23) Goal status: MET  3.  Demonstrate Lt shoulder IR to L3 to improve dressing and self care Baseline: L2 Goal status: INITIAL  4.  Report < or = to 4/10 Lt shoulder pain with overhead reaching Baseline: 5/10 Goal status: IN PROGRESS   LONG TERM GOALS: Target date: 08/28/2023    Be independent in advanced HEP for knee and shoulder   Baseline:  Goal status: INITIAL  2.  Knee: Demonstrate Lt knee A/ROM flexion to > or = to 110 degrees to allow steps and squatting without compensation  Baseline: 95 Goal status: IN PROGRESS  3.  Knee: Improve strength to negotiate steps with step-over step gait with use of rail to facilitate return to work  Baseline:  Goal status: IN PROGRESS  4.  Improve Lt shoulder A/ROM flexion to > or = to 150 degrees to improve overhead reaching  Baseline: 144 (07/22/23) Goal status: INITIAL  5.  Improve DASH to < or = to 6% to improve functional use  Baseline: 18% Goal status: INITIAL  6.  Report < or = to 3/10 Lt shoulder pain with lifting and overhead use Baseline:  Goal status: IN PROGRESS  PLAN: PT FREQUENCY: 2x/week  PT DURATION: 8 weeks  PLANNED INTERVENTIONS: 97110-Therapeutic exercises, 97530- Therapeutic activity, 97112- Neuromuscular re-education, 97535- Self Care, 56213- Manual therapy, [id number]- Gait training, [phone]- Aquatic Therapy, [phone]- Electrical stimulation (unattended), [phone]- Electrical stimulation (manual), [id number]- Vasopneumatic device, [id number]- Ultrasound, [id number]- Traction (mechanical), [id number]- Ionotophoresis 4mg /ml Dexamethasone, Patient/Family education, Stair training, Taping, Dry Needling, Joint mobilization, Joint manipulation, Scar mobilization, Vestibular training, Cryotherapy, and Moist heat  PLAN FOR NEXT SESSION: Lt shoulder ROM, strength, thoracic mobility, Lt knee strength and flexibility.  Measure Lt shoulder    Lorrene Reid, PT 07/29/23 11:02 AM   [facility] [address] [place], [place] [address] Phone # [phone] Fax [phone]

## 2023-08-05 ENCOUNTER — Ambulatory Visit: Payer: No Typology Code available for payment source

## 2023-08-05 DIAGNOSIS — G8929 Other chronic pain: Secondary | ICD-10-CM

## 2023-08-05 DIAGNOSIS — M25562 Pain in left knee: Secondary | ICD-10-CM | POA: Diagnosis not present

## 2023-08-05 DIAGNOSIS — M25612 Stiffness of left shoulder, not elsewhere classified: Secondary | ICD-10-CM

## 2023-08-05 DIAGNOSIS — M6281 Muscle weakness (generalized): Secondary | ICD-10-CM

## 2023-08-05 DIAGNOSIS — M25662 Stiffness of left knee, not elsewhere classified: Secondary | ICD-10-CM

## 2023-08-05 NOTE — Therapy (Signed)
 OUTPATIENT PHYSICAL THERAPY TREATMENT   Patient Name: Jesse Stewart MRN: [id number] DOB:[birth year], 58 y.o., male Today's Date: 08/05/2023  END OF SESSION:  PT End of Session - 08/05/23 1314     Visit Number 8    Date for PT Re-Evaluation 08/28/23    Authorization Type [employer]-15 visits approved 06/11/23-10/09/23    Authorization - Visit Number 8    Authorization - Number of Visits 15    PT Start Time 1233    PT Stop Time 1314    PT Time Calculation (min) 41 min    Activity Tolerance Patient tolerated treatment well    Behavior During Therapy [facility] for tasks assessed/performed                    Past Medical History:  Diagnosis Date   Arthritis    Hypertension    Lung cancer (HCC) 03/13/2022   Past Surgical History:  Procedure Laterality Date   KNEE ARTHROSCOPY W/ MENISCAL REPAIR     LUNG REMOVAL, PARTIAL Right 03/27/2022   TOTAL KNEE ARTHROPLASTY Left 04/29/2023   Patient Active Problem List   Diagnosis Date Noted   Colon polyps 05/16/2019   Obesity (BMI 30.0-34.9) 05/13/2019   Chest tightness 10/06/2014   COVID 10/06/2014   Hematochezia 12/07/2012   Hernia of abdominal wall 12/07/2012   Well adult exam 04/23/2012   Neoplasm of uncertain behavior of skin 05/04/2010   ACTINIC KERATOSIS 05/04/2010   Dyslipidemia 04/04/2008   GERD 04/04/2008   Osteoarthritis 04/04/2008   KNEE PAIN 04/04/2008   ABNORMAL GLUCOSE NEC 04/04/2008   ABNORMAL LIVER FUNCTION TESTS 04/04/2008   Essential hypertension 12/29/2006    PCP: Jacinta Shoe, MD  REFERRING PROVIDER: Patrina Levering, MD  REFERRING DIAG: Lt frozen shoulder, Left TKA 04/29/2023   THERAPY DIAG:  Acute pain of left knee  Chronic left shoulder pain  Muscle weakness (generalized)  Stiffness of left shoulder, not elsewhere classified  Stiffness of left knee, not elsewhere classified  Rationale for Evaluation and Treatment: Rehabilitation  ONSET DATE: Lt TKA 04/29/23, Lt shoulder 3-4  month history of pain and limited A/ROM  SUBJECTIVE:                                                                                                                                                                                      SUBJECTIVE STATEMENT: I saw the MD for my shoulder and my knee last week.  He is pleased with it and will recheck the shoulder in 4 weeks  Hand dominance: Left  PERTINENT HISTORY: Lt TKA 04/29/23, Lung cancer with lung resection 03/2022   PAIN: 07/29/23 Are you having pain? Yes:  NPRS scale: 1/10 Pain location: Lt shoulder  Pain description: brief, only when performing aggravating activity  Aggravating factors: reaching behind the back, sometimes reaching out Relieving factors: lasts a few minutes after aggravating activity and reduces when stopping the aggravating activity  PRECAUTIONS: None  RED FLAGS: None   WEIGHT BEARING RESTRICTIONS: No  FALLS:  Has patient fallen in last 6 months? 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  DIAGNOSTIC FINDINGS:  MRI on 07/25/23: Lt shoulder, no result yet  PATIENT SURVEYS :  07/02/23: [name] 18.2%  06/11/23: FOTO: 69   COGNITION: Overall cognitive status: Within functional limits for tasks assessed     SENSATION: WFL  POSTURE: Forward head and rounded shoulder posture   UPPER EXTREMITY ROM:   Active ROM Right eval Left eval Left 07/15/23 Left 07/22/23 Left 08/05/23  Shoulder  flexion 162 125* 137 144   Shoulder extension full 90* 105 105   Shoulder abduction       Shoulder adduction       Shoulder internal rotation  Lacking 5" vs the Rt Lacking 5" vs the Rt  To L2 To L1  Shoulder external rotation  Lacking 3" vs Rt Lacking 2.5 inches vs Rt  ER lacking 2.5 inches    Elbow flexion       Elbow extension       Wrist flexion       Wrist extension       Wrist ulnar deviation       Wrist radial deviation       Wrist pronation       Wrist supination       (Blank rows = not tested) P/ROM: Rt IR 25, ER 46, flexion 140 Lt knee 0-86  07/10/23: Lt knee P/ROM supine 84 degrees  07/22/23: Lt knee flexion: 95 degrees   UPPER EXTREMITY MMT:  MMT Right eval Left eval  Shoulder flexion 5/5 throughout  4-  Shoulder extension    Shoulder abduction  4-  Shoulder adduction    Shoulder internal rotation  4  Shoulder external rotation  4  Middle trapezius    Lower trapezius    Elbow flexion    Elbow extension    Wrist flexion    Wrist extension    Wrist ulnar deviation    Wrist radial deviation    Wrist pronation    Wrist supination    Grip strength (lbs)    (Blank rows = not tested)  JOINT MOBILITY TESTING:  Limited joint mobility of the Lt glenohumeral joint  PALPATION:  Palpable tenderness over Lt deltoid, posterior glenohumeral joint and proximal biceps tendon Limited PA mobility in the thoracic spine  TREATMENT DATE:   08/05/23:  Arm Bike: Level 2.0 x 6 min (3/3)-PT present to discuss  Leg press: seat 6 Bil 100# x25, Lt only 70# x25 Finger ladder: 1.5# added flexion and abduction 2x10 each Wall clocks: yellow loop 2x5 each Shoulder 3 way raises: 3# 2x10 on Lt  Ball roll outs: 3 ways x10  Knee flexion on step x10 Manual: P/ROM: Lt shoulder flexion, ER, abduction with mobs  07/29/23:  Arm Bike: Level 2.0 x 6 min (3/3)-PT present to discuss  Leg  press: seat 6 Bil 100# x25, Lt only 60# x25 Finger ladder: 1.5# added flexion and abduction 2x10 each Shoulder 3 way raises: 3# 2x10 on Lt  Ball roll outs: 3 ways x10  Sidelying ER: 3# x20 Serratus punches: 5# x20 Knee flexion on step x10 Manual: P/ROM: Lt shoulder flexion, ER, abduction with mobs  07/24/23:  Arm Bike: Level 2.0 x 6 min (3/3)-PT present to discuss  Leg press: seat 6 Bil 100# x25, Lt only 60# x25 Finger ladder: 1.5# added flexion and abduction 2x10 each Shoulder 3 way raises: 2# 2x10 on Lt  Ball roll outs: 3 ways x10  Sidelying ER: 3# x20 Serratus punches: 5# x20 Knee flexion on step x10 Manual: P/ROM: Lt shoulder flexion, ER, abduction with mobs  PATIENT EDUCATION: Education details: [email or web address]  Person educated: Patient Education method: Explanation, Demonstration, and Handouts Education comprehension: verbalized understanding and returned demonstration  HOME EXERCISE PROGRAM: For Knee: Access Code: [email or web address]  Access Code: [email or web address] URL: [email or web address] Date: 07/15/2023 Prepared by: Tresa Endo  Exercises  - Supine Shoulder Flexion Extension AAROM with Dowel  - 2-3 x daily - 7 x weekly - 1 sets - 5-10 reps - 10 hold - Supine Shoulder External Rotation with Dowel  - 2-3 x daily - 7 x weekly - 1 sets - 5-10 reps - 10 hold - Shoulder Flexion Wall Slide with Towel  - 2 x daily - 7 x weekly - 1 sets - 10 reps - 10 hold - Standing Shoulder Abduction Slides at Wall  - 2 x daily - 7 x weekly - 1 sets - 10 reps - 10 hold - Standing Shoulder Internal Rotation Stretch with Towel  - 2 x daily - 7 x weekly - 1 sets - 10 reps - 10 hold - Sidelying Open Book Thoracic Lumbar Rotation and Extension  - 2 x daily - 7 x weekly - 1 sets - 10 reps - Shoulder Extension with Resistance - Palms Forward  - 1 x daily - 7 x weekly - 2 sets - 10 reps - Standing Row with Anchored Resistance  - 1 x daily - 7 x weekly - 2 sets - 10 reps - Standing Shoulder External Rotation  with Resistance  - 1 x daily - 7 x weekly - 2 sets - 10 reps ASSESSMENT:  CLINICAL IMPRESSION: Pt denies significant limitation with use of Lt UE except with IR. IR is improved to L1 today actively.  Pain is significantly reduced with activity and only has pain with intermittent movements. Pt did well with stability exercises for Lt shoulder.  PT monitored throughout session for technique and pain.  Patient will benefit from skilled PT to address the below impairments and improve overall function.   OBJECTIVE IMPAIRMENTS: Abnormal gait, decreased activity tolerance, decreased balance, decreased mobility, difficulty walking, decreased ROM, decreased strength, hypomobility, increased muscle spasms, impaired flexibility, impaired UE functional use, postural dysfunction, and pain.   ACTIVITY LIMITATIONS: carrying, lifting, squatting, stairs, transfers, reach over  head, and locomotion level  PARTICIPATION LIMITATIONS: meal prep, cleaning, community activity, and occupation  PERSONAL FACTORS: 1-2 comorbidities: Rt TKA and lung cancer   are also affecting patient's functional outcome.   REHAB POTENTIAL: Good  CLINICAL DECISION MAKING: Stable/uncomplicated  EVALUATION COMPLEXITY: Low  GOALS: Goals reviewed with patient? Yes  SHORT TERM GOALS: Target date: 07/31/2023    Be independent in initial HEP Baseline: Goal status: MET  2.  Demonstrate Lt shoulder A/ROM flexion to > or = to 135 degrees for overhead reaching  Baseline: 144 (07/22/23) Goal status: MET  3.  Demonstrate Lt shoulder IR to L3 to improve dressing and self care Baseline: L1 Goal status: MET  4.  Report < or = to 4/10 Lt shoulder pain with overhead reaching Baseline: 5/10 Goal status: IN PROGRESS   LONG TERM GOALS: Target date: 08/28/2023    Be independent in advanced HEP for knee and shoulder  Baseline:  Goal status: INITIAL  2.  Knee: Demonstrate Lt knee A/ROM flexion to > or = to 110 degrees to allow steps and  squatting without compensation  Baseline: 95 Goal status: IN PROGRESS  3.  Knee: Improve strength to negotiate steps with step-over step gait with use of rail to facilitate return to work  Baseline:  Goal status: IN PROGRESS  4.  Improve Lt shoulder A/ROM flexion to > or = to 150 degrees to improve overhead reaching  Baseline: 144 (07/22/23) Goal status: INITIAL  5.  Improve DASH to < or = to 6% to improve functional use  Baseline: 18% Goal status: INITIAL  6.  Report < or = to 3/10 Lt shoulder pain with lifting and overhead use Baseline:  Goal status: IN PROGRESS  PLAN: PT FREQUENCY: 2x/week  PT DURATION: 8 weeks  PLANNED INTERVENTIONS: 97110-Therapeutic exercises, 97530- Therapeutic activity, 97112- Neuromuscular re-education, 97535- Self Care, 16109- Manual therapy, [id number]- Gait training, [phone]- Aquatic Therapy, [phone]- Electrical stimulation (unattended), [phone]- Electrical stimulation (manual), [id number]- Vasopneumatic device, [id number]- Ultrasound, [id number]- Traction (mechanical), [id number]- Ionotophoresis 4mg /ml Dexamethasone, Patient/Family education, Stair training, Taping, Dry Needling, Joint mobilization, Joint manipulation, Scar mobilization, Vestibular training, Cryotherapy, and Moist heat  PLAN FOR NEXT SESSION: Lt shoulder ROM, strength, thoracic mobility, Lt knee strength and flexibility.    Lorrene Reid, PT 08/05/23 1:15 PM   [facility] [address], [address] [place], [place] [address] Phone # [phone] Fax [phone]

## 2023-08-06 ENCOUNTER — Ambulatory Visit

## 2023-08-07 ENCOUNTER — Ambulatory Visit: Payer: No Typology Code available for payment source

## 2023-08-07 DIAGNOSIS — M25612 Stiffness of left shoulder, not elsewhere classified: Secondary | ICD-10-CM

## 2023-08-07 DIAGNOSIS — M25562 Pain in left knee: Secondary | ICD-10-CM

## 2023-08-07 DIAGNOSIS — M25662 Stiffness of left knee, not elsewhere classified: Secondary | ICD-10-CM

## 2023-08-07 DIAGNOSIS — G8929 Other chronic pain: Secondary | ICD-10-CM

## 2023-08-07 DIAGNOSIS — M6281 Muscle weakness (generalized): Secondary | ICD-10-CM

## 2023-08-07 NOTE — Therapy (Signed)
 OUTPATIENT PHYSICAL THERAPY TREATMENT   Patient Name: Jesse Stewart MRN: [id number] DOB:09-Nov-1965, 58 y.o., male Today's Date: 08/07/2023  END OF SESSION:  PT End of Session - 08/07/23 1103     Visit Number 9    Date for PT Re-Evaluation 08/28/23    Authorization Type VA-15 visits approved 06/11/23-10/09/23    Authorization - Visit Number 9    Authorization - Number of Visits 15    PT Start Time 1103    PT Stop Time 1142    PT Time Calculation (min) 39 min    Activity Tolerance Patient tolerated treatment well    Behavior During Therapy [facility] for tasks assessed/performed                     Past Medical History:  Diagnosis Date   Arthritis    Hypertension    Lung cancer (HCC) 03/13/2022   Past Surgical History:  Procedure Laterality Date   KNEE ARTHROSCOPY W/ MENISCAL REPAIR     LUNG REMOVAL, PARTIAL Right 03/27/2022   TOTAL KNEE ARTHROPLASTY Left 04/29/2023   Patient Active Problem List   Diagnosis Date Noted   Colon polyps 05/16/2019   Obesity (BMI 30.0-34.9) 05/13/2019   Chest tightness 10/06/2014   COVID 10/06/2014   Hematochezia 12/07/2012   Hernia of abdominal wall 12/07/2012   Well adult exam 04/23/2012   Neoplasm of uncertain behavior of skin 05/04/2010   ACTINIC KERATOSIS 05/04/2010   Dyslipidemia 04/04/2008   GERD 04/04/2008   Osteoarthritis 04/04/2008   KNEE PAIN 04/04/2008   ABNORMAL GLUCOSE NEC 04/04/2008   ABNORMAL LIVER FUNCTION TESTS 04/04/2008   Essential hypertension 12/29/2006    PCP: Jacinta Shoe, MD  REFERRING PROVIDER: Patrina Levering, MD  REFERRING DIAG: Lt frozen shoulder, Left TKA 04/29/2023   THERAPY DIAG:  Acute pain of left knee  Chronic left shoulder pain  Muscle weakness (generalized)  Stiffness of left shoulder, not elsewhere classified  Stiffness of left knee, not elsewhere classified  Rationale for Evaluation and Treatment: Rehabilitation  ONSET DATE: Lt TKA 04/29/23, Lt shoulder 3-4  month history of pain and limited A/ROM  SUBJECTIVE:                                                                                                                                                                                      SUBJECTIVE STATEMENT: My Lt shoulder feels 80% better.   Hand dominance: Left  PERTINENT HISTORY: Lt TKA 04/29/23, Lung cancer with lung resection 03/2022   PAIN: 08/07/23 Are you having pain? Yes: NPRS scale: 1/10 Pain location: Lt shoulder  Pain description: brief, only when performing aggravating activity  Aggravating  factors: reaching behind the back, sometimes reaching out Relieving factors: lasts a few minutes after aggravating activity and reduces when stopping the aggravating activity  PRECAUTIONS: None  RED FLAGS: None   WEIGHT BEARING RESTRICTIONS: No  FALLS:  Has patient fallen in last 6 months? 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  DIAGNOSTIC FINDINGS:  MRI on 07/25/23: Lt shoulder, no result yet  PATIENT SURVEYS :  07/02/23: [name] 18.2%  06/11/23: FOTO: 69   COGNITION: Overall cognitive status: Within functional limits for tasks assessed     SENSATION: WFL  POSTURE: Forward head and rounded shoulder posture   UPPER EXTREMITY ROM:   Active ROM Right eval Left eval Left 07/15/23 Left 07/22/23 Left 08/05/23  Shoulder flexion 162 125* 137 144   Shoulder extension full 90* 105 105   Shoulder  abduction       Shoulder adduction       Shoulder internal rotation  Lacking 5" vs the Rt Lacking 5" vs the Rt  To L2 To L1  Shoulder external rotation  Lacking 3" vs Rt Lacking 2.5 inches vs Rt  ER lacking 2.5 inches    Elbow flexion       Elbow extension       Wrist flexion       Wrist extension       Wrist ulnar deviation       Wrist radial deviation       Wrist pronation       Wrist supination       (Blank rows = not tested) P/ROM: Rt IR 25, ER 46, flexion 140 Lt knee 0-86  07/10/23: Lt knee P/ROM supine 84 degrees  07/22/23: Lt knee flexion: 95 degrees   UPPER EXTREMITY MMT:  MMT Right eval Left eval  Shoulder flexion 5/5 throughout  4-  Shoulder extension    Shoulder abduction  4-  Shoulder adduction    Shoulder internal rotation  4  Shoulder external rotation  4  Middle trapezius    Lower trapezius    Elbow flexion    Elbow extension    Wrist flexion    Wrist extension    Wrist ulnar deviation    Wrist radial deviation    Wrist pronation    Wrist supination    Grip strength (lbs)    (Blank rows = not tested)  JOINT MOBILITY TESTING:  Limited joint mobility of the Lt glenohumeral joint  PALPATION:  Palpable tenderness over Lt deltoid, posterior glenohumeral joint and proximal biceps tendon Limited PA mobility in the thoracic spine  TREATMENT DATE:   08/07/23:  Arm Bike: Level 2.0 x 6 min (3/3)-PT present to discuss  Leg press: seat 6 Bil 100# x25, Lt only 70# x25 Finger ladder: 1.5# added flexion and abduction 2x10 each Wall clocks: yellow loop 2x5 each Weight to shelf: flexion and scaption x 1 min each 3# weight  4D ball rolls: blue weighted ball 2x5 on Lt Open book stretch on wall:  Shoulder 3 way raises: 3# 2x10 on Lt  Knee flexion on step x10 Manual: P/ROM: Lt shoulder flexion, ER, abduction with mobs  08/05/23:  Arm Bike: Level 2.0 x 6 min  (3/3)-PT present to discuss  Leg press: seat 6 Bil 100# x25, Lt only 70# x25 Finger ladder: 1.5# added flexion and abduction 2x10 each 4D ball rolls: unweighted ball 2x5 on Lt Wall clocks: yellow loop 2x5 each Shoulder 3 way raises: 3# 2x10 on Lt  Ball roll outs: 3 ways x10  Knee flexion on step x10 Manual: P/ROM: Lt shoulder flexion, ER, abduction with mobs  07/29/23:  Arm Bike: Level 2.0 x 6 min (3/3)-PT present to discuss  Leg press: seat 6 Bil 100# x25, Lt only 60# x25 Finger ladder: 1.5# added flexion and abduction 2x10 each Shoulder 3 way raises: 3# 2x10 on Lt  Ball roll outs: 3 ways x10  Sidelying ER: 3# x20 Serratus punches: 5# x20 Knee flexion on step x10 Manual: P/ROM: Lt shoulder flexion, ER, abduction with mobs   PATIENT EDUCATION: Education details: [email or web address]  Person educated: Patient Education method: Explanation, Demonstration, and Handouts Education comprehension: verbalized understanding and returned demonstration  HOME EXERCISE PROGRAM: For Knee: Access Code: [email or web address]  Access Code: [email or web address] URL: [email or web address] Date: 07/15/2023 Prepared by: Tresa Endo  Exercises  - Supine Shoulder Flexion Extension AAROM with Dowel  - 2-3 x daily - 7 x weekly - 1 sets - 5-10 reps - 10 hold - Supine Shoulder External Rotation with Dowel  - 2-3 x daily - 7 x weekly - 1 sets - 5-10 reps - 10 hold - Shoulder Flexion Wall Slide with Towel  - 2 x daily - 7 x weekly - 1 sets - 10 reps - 10 hold - Standing Shoulder Abduction Slides at Wall  - 2 x daily - 7 x weekly - 1 sets - 10 reps - 10 hold - Standing Shoulder Internal Rotation Stretch with Towel  - 2 x daily - 7 x weekly - 1 sets - 10 reps - 10 hold - Sidelying Open Book Thoracic Lumbar Rotation and Extension  - 2 x daily - 7 x weekly - 1 sets - 10 reps - Shoulder Extension with Resistance - Palms Forward  - 1 x daily - 7 x weekly - 2 sets - 10 reps - Standing Row with Anchored Resistance  - 1 x daily - 7 x  weekly - 2 sets - 10 reps - Standing Shoulder External Rotation with Resistance  - 1 x daily - 7 x weekly - 2 sets - 10 reps ASSESSMENT:  CLINICAL IMPRESSION: Pt doesn't experience much pain with Lt UE use and reports 85% improvement since the start of care.  IR is improved to L1 today actively this week.  Pain is significantly reduced with activity and only has pain with intermittent movements. Pt did well with stability and reaching exercises for Lt shoulder.  PT monitored throughout session for technique and pain.  Patient will benefit from skilled PT to address the below impairments and improve overall function.   OBJECTIVE IMPAIRMENTS: Abnormal  gait, decreased activity tolerance, decreased balance, decreased mobility, difficulty walking, decreased ROM, decreased strength, hypomobility, increased muscle spasms, impaired flexibility, impaired UE functional use, postural dysfunction, and pain.   ACTIVITY LIMITATIONS: carrying, lifting, squatting, stairs, transfers, reach over head, and locomotion level  PARTICIPATION LIMITATIONS: meal prep, cleaning, community activity, and occupation  PERSONAL FACTORS: 1-2 comorbidities: Rt TKA and lung cancer   are also affecting patient's functional outcome.   REHAB POTENTIAL: Good  CLINICAL DECISION MAKING: Stable/uncomplicated  EVALUATION COMPLEXITY: Low  GOALS: Goals reviewed with patient? Yes  SHORT TERM GOALS: Target date: 07/31/2023    Be independent in initial HEP Baseline: Goal status: MET  2.  Demonstrate Lt shoulder A/ROM flexion to > or = to 135 degrees for overhead reaching  Baseline: 144 (07/22/23) Goal status: MET  3.  Demonstrate Lt shoulder IR to L3 to improve dressing and self care Baseline: L1 Goal status: MET  4.  Report < or = to 4/10 Lt shoulder pain with overhead reaching Baseline: 5/10 Goal status: IN PROGRESS   LONG TERM GOALS: Target date: 08/28/2023    Be independent in advanced HEP for knee and shoulder   Baseline:  Goal status: INITIAL  2.  Knee: Demonstrate Lt knee A/ROM flexion to > or = to 110 degrees to allow steps and squatting without compensation  Baseline: 95 Goal status: IN PROGRESS  3.  Knee: Improve strength to negotiate steps with step-over step gait with use of rail to facilitate return to work  Baseline:  Goal status: IN PROGRESS  4.  Improve Lt shoulder A/ROM flexion to > or = to 150 degrees to improve overhead reaching  Baseline: 144 (07/22/23) Goal status: INITIAL  5.  Improve DASH to < or = to 6% to improve functional use  Baseline: 18% Goal status: INITIAL  6.  Report < or = to 3/10 Lt shoulder pain with lifting and overhead use Baseline: no significant pain (08/07/23) Goal status: MET  PLAN: PT FREQUENCY: 2x/week  PT DURATION: 8 weeks  PLANNED INTERVENTIONS: 97110-Therapeutic exercises, 97530- Therapeutic activity, 97112- Neuromuscular re-education, 97535- Self Care, 10272- Manual therapy, [id number]- Gait training, [phone]- Aquatic Therapy, [phone]- Electrical stimulation (unattended), [phone]- Electrical stimulation (manual), [id number]- Vasopneumatic device, [id number]- Ultrasound, [id number]- Traction (mechanical), [id number]- Ionotophoresis 4mg /ml Dexamethasone, Patient/Family education, Stair training, Taping, Dry Needling, Joint mobilization, Joint manipulation, Scar mobilization, Vestibular training, Cryotherapy, and Moist heat  PLAN FOR NEXT SESSION: Lt shoulder ROM, strength, thoracic mobility, Lt knee strength and flexibility.    Lorrene Reid, PT 08/07/23 11:52 AM   [facility] [address] [place], [place] [address] Phone # [phone] Fax [phone]

## 2023-08-12 ENCOUNTER — Ambulatory Visit: Payer: No Typology Code available for payment source | Attending: Physician Assistant

## 2023-08-12 DIAGNOSIS — M25512 Pain in left shoulder: Secondary | ICD-10-CM | POA: Insufficient documentation

## 2023-08-12 DIAGNOSIS — M25562 Pain in left knee: Secondary | ICD-10-CM

## 2023-08-12 DIAGNOSIS — R2689 Other abnormalities of gait and mobility: Secondary | ICD-10-CM

## 2023-08-12 DIAGNOSIS — M25612 Stiffness of left shoulder, not elsewhere classified: Secondary | ICD-10-CM | POA: Diagnosis present

## 2023-08-12 DIAGNOSIS — M6281 Muscle weakness (generalized): Secondary | ICD-10-CM

## 2023-08-12 DIAGNOSIS — M25662 Stiffness of left knee, not elsewhere classified: Secondary | ICD-10-CM

## 2023-08-12 DIAGNOSIS — G8929 Other chronic pain: Secondary | ICD-10-CM | POA: Diagnosis present

## 2023-08-12 NOTE — Therapy (Signed)
 OUTPATIENT PHYSICAL THERAPY TREATMENT   Patient Name: Jesse Stewart MRN: [id number] DOB:07-12-1965, 59 y.o., male Today's Date: 08/12/2023  END OF SESSION:  PT End of Session - 08/12/23 1146     Visit Number 10    Date for PT Re-Evaluation 08/28/23    Authorization Type [facility]-15 visits approved 06/11/23-10/09/23    Authorization - Visit Number 10    Authorization - Number of Visits 15    PT Start Time 1102    PT Stop Time 1144    PT Time Calculation (min) 42 min    Activity Tolerance Patient tolerated treatment well    Behavior During Therapy [facility] for tasks assessed/performed                      Past Medical History:  Diagnosis Date   Arthritis    Hypertension    Lung cancer (HCC) 03/13/2022   Past Surgical History:  Procedure Laterality Date   KNEE ARTHROSCOPY W/ MENISCAL REPAIR     LUNG REMOVAL, PARTIAL Right 03/27/2022   TOTAL KNEE ARTHROPLASTY Left 04/29/2023   Patient Active Problem List   Diagnosis Date Noted   Colon polyps 05/16/2019   Obesity (BMI 30.0-34.9) 05/13/2019   Chest tightness 10/06/2014   COVID 10/06/2014   Hematochezia 12/07/2012   Hernia of abdominal wall 12/07/2012   Well adult exam 04/23/2012   Neoplasm of uncertain behavior of skin 05/04/2010   ACTINIC KERATOSIS 05/04/2010   Dyslipidemia 04/04/2008   GERD 04/04/2008   Osteoarthritis 04/04/2008   KNEE PAIN 04/04/2008   ABNORMAL GLUCOSE NEC 04/04/2008   ABNORMAL LIVER FUNCTION TESTS 04/04/2008   Essential hypertension 12/29/2006    PCP: Jacinta Shoe, MD  REFERRING PROVIDER: Patrina Levering, MD  REFERRING DIAG: Lt frozen shoulder, Left TKA 04/29/2023   THERAPY DIAG:  Acute pain of left knee  Chronic left shoulder pain  Muscle weakness (generalized)  Stiffness of left shoulder, not elsewhere classified  Stiffness of left knee, not elsewhere classified  Other abnormalities of gait and mobility  Rationale for Evaluation and Treatment:  Rehabilitation  ONSET DATE: Lt TKA 04/29/23, Lt shoulder 3-4 month history of pain and limited A/ROM  SUBJECTIVE:                                                                                                                                                                                      SUBJECTIVE STATEMENT: My knee feels good. I've been walking a lot.  My shoulder only hurts when I move it a certain way.  Hand dominance: Left  PERTINENT HISTORY: Lt TKA 04/29/23, Lung cancer with lung resection 03/2022   PAIN: 08/12/23 Are  you having pain? Yes: NPRS scale: 1/10 Pain location: Lt shoulder  Pain description: brief, only when performing aggravating activity  Aggravating factors: reaching behind the back, sometimes reaching out Relieving factors: lasts a few minutes after aggravating activity and reduces when stopping the aggravating activity  PRECAUTIONS: None  RED FLAGS: None   WEIGHT BEARING RESTRICTIONS: No  FALLS:  Has patient fallen in last 6 months? 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  DIAGNOSTIC FINDINGS:  MRI on 07/25/23: Lt shoulder, no result yet  PATIENT SURVEYS :  07/02/23: [name] 18.2%  06/11/23: FOTO: 69   COGNITION: Overall cognitive status: Within functional limits for tasks assessed     SENSATION: WFL  POSTURE: Forward head and rounded shoulder posture   UPPER EXTREMITY ROM:   Active ROM Right eval Left eval  Left 07/15/23 Left 07/22/23 Left 08/05/23 Left  08/12/23  Shoulder flexion 162 125* 137 144  145  Shoulder extension full 90* 105 105    Shoulder abduction        Shoulder adduction        Shoulder internal rotation  Lacking 5" vs the Rt Lacking 5" vs the Rt  To L2 To L1 To T12  Shoulder external rotation  Lacking 3" vs Rt Lacking 2.5 inches vs Rt  ER lacking 2.5 inches     Elbow flexion        Elbow extension        Wrist flexion        Wrist extension        Wrist ulnar deviation        Wrist radial deviation        Wrist pronation        Wrist supination        (Blank rows = not tested) P/ROM: Rt IR 25, ER 46, flexion 140 Lt knee 0-86  07/10/23: Lt knee P/ROM supine 84 degrees  07/22/23: Lt knee flexion: 95 degrees  08/12/23: Lt knee flexion 97  UPPER EXTREMITY MMT:  MMT Right eval Left eval  Shoulder flexion 5/5 throughout  4-  Shoulder extension    Shoulder abduction  4-  Shoulder adduction    Shoulder internal rotation  4  Shoulder external rotation  4  Middle trapezius    Lower trapezius    Elbow flexion    Elbow extension    Wrist flexion    Wrist extension    Wrist ulnar deviation    Wrist radial deviation    Wrist pronation    Wrist supination    Grip strength (lbs)    (Blank rows = not tested)  JOINT MOBILITY TESTING:  Limited joint mobility of the Lt glenohumeral joint  PALPATION:  Palpable tenderness over Lt deltoid, posterior glenohumeral joint and proximal biceps tendon Limited PA mobility in the thoracic spine  TREATMENT DATE:   08/12/23:  Arm Bike: Level 2.0 x 6 min (3/3)-PT present to discuss  Leg press: seat 6 Bil 110# x25, Lt only 75# x25 Finger ladder: 1.5# added flexion and abduction 2x10 each Wall clocks: yellow loop 2x5 each Weight to shelf: flexion and scaption x 1 min each 3# weight  4D ball rolls: blue weighted ball 2x5 on Lt Open book  stretch on wall:  Shoulder 3 way raises: 3# 2x10 on Lt  Knee flexion on step x10   08/07/23:  Arm Bike: Level 2.0 x 6 min (3/3)-PT present to discuss  Leg press: seat 6 Bil 100# x25, Lt only 70# x25 Finger ladder: 1.5# added flexion and abduction 2x10 each Wall clocks: yellow loop 2x5 each Weight to shelf: flexion and scaption x 1 min each 3# weight  4D ball rolls: blue weighted ball 2x5 on Lt Open book stretch on wall:  Shoulder 3 way raises: 3# 2x10 on Lt  Knee flexion on step x10 Manual: P/ROM: Lt shoulder flexion, ER, abduction with mobs  08/05/23:  Arm Bike: Level 2.0 x 6 min (3/3)-PT present to discuss  Leg press: seat 6 Bil 100# x25, Lt only 70# x25 Finger ladder: 1.5# added flexion and abduction 2x10 each 4D ball rolls: unweighted ball 2x5 on Lt Wall clocks: yellow loop 2x5 each Shoulder 3 way raises: 3# 2x10 on Lt  Ball roll outs: 3 ways x10  Knee flexion on step x10 Manual: P/ROM: Lt shoulder flexion, ER, abduction with mobs   PATIENT EDUCATION: Education details: [email or web address]  Person educated: Patient Education method: Explanation, Demonstration, and Handouts Education comprehension: verbalized understanding and returned demonstration  HOME EXERCISE PROGRAM: For Knee: Access Code: [email or web address]  Access Code: [email or web address] URL: [email or web address] Date: 07/15/2023 Prepared by: Tresa Endo  Exercises  - Supine Shoulder Flexion Extension AAROM with Dowel  - 2-3 x daily - 7 x weekly - 1 sets - 5-10 reps - 10 hold - Supine Shoulder External Rotation with Dowel  - 2-3 x daily - 7 x weekly - 1 sets - 5-10 reps - 10 hold - Shoulder Flexion Wall Slide with Towel  - 2 x daily - 7 x weekly - 1 sets - 10 reps - 10 hold - Standing Shoulder Abduction Slides at Wall  - 2 x daily - 7 x weekly - 1 sets - 10 reps - 10 hold - Standing Shoulder Internal Rotation Stretch with Towel  - 2 x daily - 7 x weekly - 1 sets - 10 reps - 10 hold - Sidelying Open Book Thoracic Lumbar Rotation  and Extension  - 2 x daily - 7 x weekly - 1 sets - 10 reps - Shoulder Extension with Resistance - Palms Forward  - 1 x daily - 7 x weekly - 2 sets - 10 reps - Standing Row with Anchored Resistance  - 1 x daily - 7 x weekly - 2 sets - 10 reps - Standing Shoulder External Rotation with Resistance  - 1 x daily - 7 x weekly - 2 sets - 10 reps ASSESSMENT:  CLINICAL IMPRESSION: Pt doesn't experience much pain with Lt UE use except for reaching behind the back and reports 85% improvement since the start of care.  Pt tolerated increased weight on leg press today.  Pt did well with stability and reaching exercises for Lt shoulder.  IR A/ROM improved again today. Lt knee flexion limited to 97 degrees.  He does report that it is easier to sit with his foot flat  on the ground with knee bent when sitting in a chair.  PT monitored throughout session for technique and pain.  Patient will benefit from skilled PT to address the below impairments and improve overall function.   OBJECTIVE IMPAIRMENTS: Abnormal gait, decreased activity tolerance, decreased balance, decreased mobility, difficulty walking, decreased ROM, decreased strength, hypomobility, increased muscle spasms, impaired flexibility, impaired UE functional use, postural dysfunction, and pain.   ACTIVITY LIMITATIONS: carrying, lifting, squatting, stairs, transfers, reach over head, and locomotion level  PARTICIPATION LIMITATIONS: meal prep, cleaning, community activity, and occupation  PERSONAL FACTORS: 1-2 comorbidities: Rt TKA and lung cancer   are also affecting patient's functional outcome.   REHAB POTENTIAL: Good  CLINICAL DECISION MAKING: Stable/uncomplicated  EVALUATION COMPLEXITY: Low  GOALS: Goals reviewed with patient? Yes  SHORT TERM GOALS: Target date: 07/31/2023    Be independent in initial HEP Baseline: Goal status: MET  2.  Demonstrate Lt shoulder A/ROM flexion to > or = to 135 degrees for overhead reaching  Baseline: 144  (07/22/23) Goal status: MET  3.  Demonstrate Lt shoulder IR to L3 to improve dressing and self care Baseline: T12 (08/12/23) Goal status: MET  4.  Report < or = to 4/10 Lt shoulder pain with overhead reaching Baseline: 5/10 Goal status: IN PROGRESS   LONG TERM GOALS: Target date: 08/28/2023    Be independent in advanced HEP for knee and shoulder  Baseline:  Goal status: INITIAL  2.  Knee: Demonstrate Lt knee A/ROM flexion to > or = to 110 degrees to allow steps and squatting without compensation  Baseline: 97 (08/12/23) Goal status: IN PROGRESS  3.  Knee: Improve strength to negotiate steps with step-over step gait with use of rail to facilitate return to work  Baseline: step over step with ascending, step to with descending  (08/12/23) Goal status: IN PROGRESS  4.  Improve Lt shoulder A/ROM flexion to > or = to 150 degrees to improve overhead reaching  Baseline: 145 (08/12/23) Goal status: IN PROGRESS  5.  Improve DASH to < or = to 6% to improve functional use  Baseline: 18% Goal status: IN PROGRESS  6.  Report < or = to 3/10 Lt shoulder pain with lifting and overhead use Baseline: no significant pain (08/07/23) Goal status: MET  PLAN: PT FREQUENCY: 2x/week  PT DURATION: 8 weeks  PLANNED INTERVENTIONS: 97110-Therapeutic exercises, 97530- Therapeutic activity, 97112- Neuromuscular re-education, 97535- Self Care, 86578- Manual therapy, [id number]- Gait training, [phone]- Aquatic Therapy, [phone]- Electrical stimulation (unattended), [phone]- Electrical stimulation (manual), [id number]- Vasopneumatic device, [id number]- Ultrasound, [id number]- Traction (mechanical), [id number]- Ionotophoresis 4mg /ml Dexamethasone, Patient/Family education, Stair training, Taping, Dry Needling, Joint mobilization, Joint manipulation, Scar mobilization, Vestibular training, Cryotherapy, and Moist heat  PLAN FOR NEXT SESSION: Lt shoulder ROM, strength, thoracic mobility, Lt knee strength and flexibility.    Lorrene Reid,  PT 08/12/23 11:47 AM   [facility] [address], [address] [place], [place] [address] Phone # [phone] Fax [phone]

## 2023-08-14 ENCOUNTER — Ambulatory Visit: Payer: No Typology Code available for payment source

## 2023-08-14 DIAGNOSIS — M25562 Pain in left knee: Secondary | ICD-10-CM

## 2023-08-14 DIAGNOSIS — G8929 Other chronic pain: Secondary | ICD-10-CM

## 2023-08-14 DIAGNOSIS — M6281 Muscle weakness (generalized): Secondary | ICD-10-CM

## 2023-08-14 DIAGNOSIS — M25612 Stiffness of left shoulder, not elsewhere classified: Secondary | ICD-10-CM

## 2023-08-14 DIAGNOSIS — M25662 Stiffness of left knee, not elsewhere classified: Secondary | ICD-10-CM

## 2023-08-14 DIAGNOSIS — R2689 Other abnormalities of gait and mobility: Secondary | ICD-10-CM

## 2023-08-14 NOTE — Therapy (Signed)
 OUTPATIENT PHYSICAL THERAPY TREATMENT   Patient Name: Jesse Stewart MRN: [id number] DOB:June 03, 1965, 58 y.o., male Today's Date: 08/14/2023  END OF SESSION:  PT End of Session - 08/14/23 1153     Visit Number 11    Date for PT Re-Evaluation 08/28/23    Authorization Type VA-15 visits approved 06/11/23-10/09/23    Authorization - Visit Number 11    Authorization - Number of Visits 15    PT Start Time 1102    PT Stop Time 1144    PT Time Calculation (min) 42 min    Activity Tolerance Patient tolerated treatment well    Behavior During Therapy [facility] for tasks assessed/performed                       Past Medical History:  Diagnosis Date   Arthritis    Hypertension    Lung cancer (HCC) 03/13/2022   Past Surgical History:  Procedure Laterality Date   KNEE ARTHROSCOPY W/ MENISCAL REPAIR     LUNG REMOVAL, PARTIAL Right 03/27/2022   TOTAL KNEE ARTHROPLASTY Left 04/29/2023   Patient Active Problem List   Diagnosis Date Noted   Colon polyps 05/16/2019   Obesity (BMI 30.0-34.9) 05/13/2019   Chest tightness 10/06/2014   COVID 10/06/2014   Hematochezia 12/07/2012   Hernia of abdominal wall 12/07/2012   Well adult exam 04/23/2012   Neoplasm of uncertain behavior of skin 05/04/2010   ACTINIC KERATOSIS 05/04/2010   Dyslipidemia 04/04/2008   GERD 04/04/2008   Osteoarthritis 04/04/2008   KNEE PAIN 04/04/2008   ABNORMAL GLUCOSE NEC 04/04/2008   ABNORMAL LIVER FUNCTION TESTS 04/04/2008   Essential hypertension 12/29/2006    PCP: Jacinta Shoe, MD  REFERRING PROVIDER: Patrina Levering, MD  REFERRING DIAG: Lt frozen shoulder, Left TKA 04/29/2023   THERAPY DIAG:  Acute pain of left knee  Chronic left shoulder pain  Muscle weakness (generalized)  Stiffness of left shoulder, not elsewhere classified  Stiffness of left knee, not elsewhere classified  Other abnormalities of gait and mobility  Rationale for Evaluation and Treatment:  Rehabilitation  ONSET DATE: Lt TKA 04/29/23, Lt shoulder 3-4 month history of pain and limited A/ROM  SUBJECTIVE:                                                                                                                                                                                      SUBJECTIVE STATEMENT: Shoulder and knee feel good. Therapy has helped a ton.   Hand dominance: Left  PERTINENT HISTORY: Lt TKA 04/29/23, Lung cancer with lung resection 03/2022   PAIN: 08/12/23 Are you having pain? Yes: NPRS scale: 1/10 Pain location:  Lt shoulder  Pain description: brief, only when performing aggravating activity  Aggravating factors: reaching behind the back, sometimes reaching out Relieving factors: lasts a few minutes after aggravating activity and reduces when stopping the aggravating activity  PRECAUTIONS: None  RED FLAGS: None   WEIGHT BEARING RESTRICTIONS: No  FALLS:  Has patient fallen in last 6 months? 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  DIAGNOSTIC FINDINGS:  MRI on 07/25/23: Lt shoulder, no result yet  PATIENT SURVEYS :  07/02/23: [name] 18.2%  06/11/23: FOTO: 69   COGNITION: Overall cognitive status: Within functional limits for tasks assessed     SENSATION: WFL  POSTURE: Forward head and rounded shoulder posture   UPPER EXTREMITY ROM:   Active ROM Right eval Left eval Left 07/15/23 Left 07/22/23 Left 08/05/23 Left   08/12/23  Shoulder flexion 162 125* 137 144  145  Shoulder extension full 90* 105 105    Shoulder abduction        Shoulder adduction        Shoulder internal rotation  Lacking 5" vs the Rt Lacking 5" vs the Rt  To L2 To L1 To T12  Shoulder external rotation  Lacking 3" vs Rt Lacking 2.5 inches vs Rt  ER lacking 2.5 inches     Elbow flexion        Elbow extension        Wrist flexion        Wrist extension        Wrist ulnar deviation        Wrist radial deviation        Wrist pronation        Wrist supination        (Blank rows = not tested) P/ROM: Rt IR 25, ER 46, flexion 140 Lt knee 0-86  07/10/23: Lt knee P/ROM supine 84 degrees  07/22/23: Lt knee flexion: 95 degrees  08/12/23: Lt knee flexion 97  UPPER EXTREMITY MMT:  MMT Right eval Left eval  Shoulder flexion 5/5 throughout  4-  Shoulder extension    Shoulder abduction  4-  Shoulder adduction    Shoulder internal rotation  4  Shoulder external rotation  4  Middle trapezius    Lower trapezius    Elbow flexion    Elbow extension    Wrist flexion    Wrist extension    Wrist ulnar deviation    Wrist radial deviation    Wrist pronation    Wrist supination    Grip strength (lbs)    (Blank rows = not tested)  JOINT MOBILITY TESTING:  Limited joint mobility of the Lt glenohumeral joint  PALPATION:  Palpable tenderness over Lt deltoid, posterior glenohumeral joint and proximal biceps tendon Limited PA mobility in the thoracic spine  TREATMENT DATE:    08/12/23:  Arm Bike: Level 2.0 x 8 min (3/3)-PT present to discuss  Leg press: seat 6 Bil 110# x25, Lt only 75# x25 Finger ladder: 1.5# added flexion and abduction 2x10 each Wall clocks: yellow loop 2x5 each Weight to shelf: flexion and scaption x 1 min each 3# weight  Seated rows: 40# x10, 45# x15  4D ball rolls: blue weighted ball 2x5 on Lt Open book stretch on  wall: x10 Shoulder 3 way raises: 4# 2x10 on Lt  Knee flexion on step x10 08/12/23:  Arm Bike: Level 2.0 x 6 min (3/3)-PT present to discuss  Leg press: seat 6 Bil 110# x25, Lt only 75# x25 Finger ladder: 1.5# added flexion and abduction 2x10 each Wall clocks: yellow loop 2x5 each Weight to shelf: flexion and scaption x 1 min each 3# weight  4D ball rolls: blue weighted ball 2x5 on Lt Open book stretch on wall:  Shoulder 3 way raises: 3# 2x10 on Lt  Knee flexion on step x10   08/07/23:  Arm Bike: Level 2.0 x 6 min (3/3)-PT present to discuss  Leg press: seat 6 Bil 100# x25, Lt only 70# x25 Finger ladder: 1.5# added flexion and abduction 2x10 each Wall clocks: yellow loop 2x5 each Weight to shelf: flexion and scaption x 1 min each 3# weight  4D ball rolls: blue weighted ball 2x5 on Lt Open book stretch on wall:  Shoulder 3 way raises: 3# 2x10 on Lt  Knee flexion on step x10 Manual: P/ROM: Lt shoulder flexion, ER, abduction with mobs  08/05/23:  Arm Bike: Level 2.0 x 6 min (3/3)-PT present to discuss  Leg press: seat 6 Bil 100# x25, Lt only 70# x25 Finger ladder: 1.5# added flexion and abduction 2x10 each 4D ball rolls: unweighted ball 2x5 on Lt Wall clocks: yellow loop 2x5 each Shoulder 3 way raises: 3# 2x10 on Lt  Ball roll outs: 3 ways x10  Knee flexion on step x10 Manual: P/ROM: Lt shoulder flexion, ER, abduction with mobs   PATIENT EDUCATION: Education details: [email or web address]  Person educated: Patient Education method: Explanation, Demonstration, and Handouts Education comprehension: verbalized understanding and returned demonstration  HOME EXERCISE PROGRAM: For Knee: Access Code: [email or web address]  Access Code: [email or web address] URL: [email or web address] Date: 07/15/2023 Prepared by: Tresa Endo  Exercises  - Supine Shoulder Flexion Extension AAROM with Dowel  - 2-3 x daily - 7 x weekly - 1 sets - 5-10 reps - 10 hold - Supine Shoulder External Rotation with Dowel  - 2-3 x  daily - 7 x weekly - 1 sets - 5-10 reps - 10 hold - Shoulder Flexion Wall Slide with Towel  - 2 x daily - 7 x weekly - 1 sets - 10 reps - 10 hold - Standing Shoulder Abduction Slides at Wall  - 2 x daily - 7 x weekly - 1 sets - 10 reps - 10 hold - Standing Shoulder Internal Rotation Stretch with Towel  - 2 x daily - 7 x weekly - 1 sets - 10 reps - 10 hold - Sidelying Open Book Thoracic Lumbar Rotation and Extension  - 2 x daily - 7 x weekly - 1 sets - 10 reps - Shoulder Extension with Resistance - Palms Forward  - 1 x daily - 7 x weekly - 2 sets - 10 reps - Standing Row with Anchored Resistance  - 1 x daily - 7 x weekly - 2 sets - 10 reps - Standing Shoulder External Rotation with Resistance  -  1 x daily - 7 x weekly - 2 sets - 10 reps ASSESSMENT:  CLINICAL IMPRESSION: Pt continues to do well.  Lt UE strength, endurance and flexibility have all improved.   Pt tolerated increased weight on leg press this week  Pt did well with stability and reaching exercises for Lt shoulder.  IR A/ROM improved again today. Lt knee ROM has plateaued although he is functional with his movement.   PT monitored throughout session for technique and pain. Advanced weight with 3 way raises on Lt to 4# today.   Patient will benefit from skilled PT to address the below impairments and improve overall function.   OBJECTIVE IMPAIRMENTS: Abnormal gait, decreased activity tolerance, decreased balance, decreased mobility, difficulty walking, decreased ROM, decreased strength, hypomobility, increased muscle spasms, impaired flexibility, impaired UE functional use, postural dysfunction, and pain.   ACTIVITY LIMITATIONS: carrying, lifting, squatting, stairs, transfers, reach over head, and locomotion level  PARTICIPATION LIMITATIONS: meal prep, cleaning, community activity, and occupation  PERSONAL FACTORS: 1-2 comorbidities: Rt TKA and lung cancer   are also affecting patient's functional outcome.   REHAB POTENTIAL:  Good  CLINICAL DECISION MAKING: Stable/uncomplicated  EVALUATION COMPLEXITY: Low  GOALS: Goals reviewed with patient? Yes  SHORT TERM GOALS: Target date: 07/31/2023    Be independent in initial HEP Baseline: Goal status: MET  2.  Demonstrate Lt shoulder A/ROM flexion to > or = to 135 degrees for overhead reaching  Baseline: 144 (07/22/23) Goal status: MET  3.  Demonstrate Lt shoulder IR to L3 to improve dressing and self care Baseline: T12 (08/12/23) Goal status: MET  4.  Report < or = to 4/10 Lt shoulder pain with overhead reaching Baseline: 5/10 Goal status: IN PROGRESS   LONG TERM GOALS: Target date: 08/28/2023    Be independent in advanced HEP for knee and shoulder  Baseline:  Goal status: INITIAL  2.  Knee: Demonstrate Lt knee A/ROM flexion to > or = to 110 degrees to allow steps and squatting without compensation  Baseline: 97 (08/12/23) Goal status: IN PROGRESS  3.  Knee: Improve strength to negotiate steps with step-over step gait with use of rail to facilitate return to work  Baseline: step over step with ascending, step to with descending  (08/12/23) Goal status: IN PROGRESS  4.  Improve Lt shoulder A/ROM flexion to > or = to 150 degrees to improve overhead reaching  Baseline: 145 (08/12/23) Goal status: IN PROGRESS  5.  Improve DASH to < or = to 6% to improve functional use  Baseline: 18% Goal status: IN PROGRESS  6.  Report < or = to 3/10 Lt shoulder pain with lifting and overhead use Baseline: no significant pain (08/07/23) Goal status: MET  PLAN: PT FREQUENCY: 2x/week  PT DURATION: 8 weeks  PLANNED INTERVENTIONS: 97110-Therapeutic exercises, 97530- Therapeutic activity, 97112- Neuromuscular re-education, 97535- Self Care, 98119- Manual therapy, [id number]- Gait training, [phone]- Aquatic Therapy, [phone]- Electrical stimulation (unattended), [phone]- Electrical stimulation (manual), [id number]- Vasopneumatic device, [id number]- Ultrasound, [id number]- Traction (mechanical),  [id number]- Ionotophoresis 4mg /ml Dexamethasone, Patient/Family education, Stair training, Taping, Dry Needling, Joint mobilization, Joint manipulation, Scar mobilization, Vestibular training, Cryotherapy, and Moist heat  PLAN FOR NEXT SESSION: Lt shoulder ROM, strength, thoracic mobility, Lt knee strength and flexibility.  2 more sessions probable.    Lorrene Reid, PT 08/14/23 11:57 AM   [facility] [address], [address] [place], [place] [address] Phone # [phone] Fax [phone]

## 2023-08-19 ENCOUNTER — Ambulatory Visit: Payer: No Typology Code available for payment source

## 2023-08-19 ENCOUNTER — Telehealth: Payer: Self-pay

## 2023-08-19 NOTE — Telephone Encounter (Signed)
PT called pt due to no-show appt.  Left voicemail.

## 2023-08-21 ENCOUNTER — Ambulatory Visit: Payer: No Typology Code available for payment source

## 2023-08-21 DIAGNOSIS — M25562 Pain in left knee: Secondary | ICD-10-CM | POA: Diagnosis not present

## 2023-08-21 DIAGNOSIS — M25662 Stiffness of left knee, not elsewhere classified: Secondary | ICD-10-CM

## 2023-08-21 DIAGNOSIS — M6281 Muscle weakness (generalized): Secondary | ICD-10-CM

## 2023-08-21 DIAGNOSIS — M25612 Stiffness of left shoulder, not elsewhere classified: Secondary | ICD-10-CM

## 2023-08-21 DIAGNOSIS — R2689 Other abnormalities of gait and mobility: Secondary | ICD-10-CM

## 2023-08-21 DIAGNOSIS — G8929 Other chronic pain: Secondary | ICD-10-CM

## 2023-08-21 NOTE — Therapy (Signed)
 OUTPATIENT PHYSICAL THERAPY TREATMENT   Patient Name: Jesse Stewart MRN: [id number] DOB:[birth year], 58 y.o., male Today's Date: 08/21/2023  END OF SESSION:  PT End of Session - 08/21/23 1106     Visit Number 12    Date for PT Re-Evaluation 08/28/23    PT Start Time 1102    PT Stop Time 1140    PT Time Calculation (min) 38 min    Activity Tolerance Patient tolerated treatment well    Behavior During Therapy [facility] for tasks assessed/performed                        Past Medical History:  Diagnosis Date   Arthritis    Hypertension    Lung cancer (HCC) 03/13/2022   Past Surgical History:  Procedure Laterality Date   KNEE ARTHROSCOPY W/ MENISCAL REPAIR     LUNG REMOVAL, PARTIAL Right 03/27/2022   TOTAL KNEE ARTHROPLASTY Left 04/29/2023   Patient Active Problem List   Diagnosis Date Noted   Colon polyps 05/16/2019   Obesity (BMI 30.0-34.9) 05/13/2019   Chest tightness 10/06/2014   COVID 10/06/2014   Hematochezia 12/07/2012   Hernia of abdominal wall 12/07/2012   Well adult exam 04/23/2012   Neoplasm of uncertain behavior of skin 05/04/2010   ACTINIC KERATOSIS 05/04/2010   Dyslipidemia 04/04/2008   GERD 04/04/2008   Osteoarthritis 04/04/2008   KNEE PAIN 04/04/2008   ABNORMAL GLUCOSE NEC 04/04/2008   ABNORMAL LIVER FUNCTION TESTS 04/04/2008   Essential hypertension 12/29/2006    PCP: Jacinta Shoe, MD  REFERRING PROVIDER: Patrina Levering, MD  REFERRING DIAG: Lt frozen shoulder, Left TKA 04/29/2023   THERAPY DIAG:  Acute pain of left knee  Chronic left shoulder pain  Muscle weakness (generalized)  Stiffness of left shoulder, not elsewhere classified  Stiffness of left knee, not elsewhere classified  Other abnormalities of gait and mobility  Rationale for Evaluation and Treatment: Rehabilitation  ONSET DATE: Lt TKA 04/29/23, Lt shoulder 3-4 month history of pain and limited A/ROM  SUBJECTIVE:                                                                                                                                                                                       SUBJECTIVE STATEMENT: I am ready to D/C.  Everything feels good.    Hand dominance: Left  PERTINENT HISTORY: Lt TKA 04/29/23, Lung cancer with lung resection 03/2022   PAIN: 08/21/23 Are you having pain? Yes: NPRS scale: 1/10 Pain location: Lt shoulder  Pain description: brief, only when performing aggravating activity  Aggravating factors: reaching behind the back, sometimes reaching out Relieving factors: lasts a  few minutes after aggravating activity and reduces when stopping the aggravating activity  PRECAUTIONS: None  RED FLAGS: None   WEIGHT BEARING RESTRICTIONS: No  FALLS:  Has patient fallen in last 6 months? 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  DIAGNOSTIC FINDINGS:  MRI on 07/25/23: Lt shoulder, no result yet  PATIENT SURVEYS :  07/02/23: [name] 18.2%  06/11/23: FOTO: 69   COGNITION: Overall cognitive status: Within functional limits for tasks assessed     SENSATION: WFL  POSTURE: Forward head and rounded shoulder posture   UPPER EXTREMITY ROM:   Active ROM Right eval Left eval Left 07/15/23 Left 07/22/23 Left 08/05/23 Left  08/12/23  Shoulder flexion 162 125* 137 144  145  Shoulder extension full 90* 105 105    Shoulder abduction        Shoulder adduction        Shoulder internal  rotation  Lacking 5" vs the Rt Lacking 5" vs the Rt  To L2 To L1 To T12  Shoulder external rotation  Lacking 3" vs Rt Lacking 2.5 inches vs Rt  ER lacking 2.5 inches     Elbow flexion        Elbow extension        Wrist flexion        Wrist extension        Wrist ulnar deviation        Wrist radial deviation        Wrist pronation        Wrist supination        (Blank rows = not tested) P/ROM: Rt IR 25, ER 46, flexion 140 Lt knee 0-86  07/10/23: Lt knee P/ROM supine 84 degrees  07/22/23: Lt knee flexion: 95 degrees  08/12/23: Lt knee flexion 97  UPPER EXTREMITY MMT:  MMT Right eval Left eval  Shoulder flexion 5/5 throughout  4-  Shoulder extension    Shoulder abduction  4-  Shoulder adduction    Shoulder internal rotation  4  Shoulder external rotation  4  Middle trapezius    Lower trapezius    Elbow flexion    Elbow extension    Wrist flexion    Wrist extension    Wrist ulnar deviation    Wrist radial deviation    Wrist pronation    Wrist supination    Grip strength (lbs)    (Blank rows = not tested)  JOINT MOBILITY TESTING:  Limited joint mobility of the Lt glenohumeral joint  PALPATION:  Palpable tenderness over Lt deltoid, posterior glenohumeral joint and proximal biceps tendon Limited PA mobility in the thoracic spine  TREATMENT DATE:   08/21/23:  Arm Bike: Level 2.0 x 8 min (3/3)-PT present to discuss  Leg press: seat 6 Bil 110# x25, Lt only 75# x25 Finger ladder: 1.5# added flexion and abduction 2x10 each Wall clocks: yellow loop 2x5 each Weight to shelf: flexion and scaption x 1 min each 3# weight  Seated rows: 45# 2x10  4D ball rolls: blue weighted ball 2x5 on Lt Shoulder 3 way raises: 4# 2x10 on Lt  Knee flexion on step x10  08/14/23:  Arm Bike: Level 2.0 x 8 min (3/3)-PT present to discuss  Leg press: seat 6 Bil 110# x25, Lt only 75# x25 Finger  ladder: 1.5# added flexion and abduction 2x10 each Wall clocks: yellow loop 2x5 each Weight to shelf: flexion and scaption x 1 min each 3# weight  Seated rows: 40# x10, 45# x15  4D ball rolls: blue weighted ball 2x5 on Lt Open book stretch on wall: x10 Shoulder 3 way raises: 4# 2x10 on Lt  Knee flexion on step x10 08/12/23:  Arm Bike: Level 2.0 x 6 min (3/3)-PT present to discuss  Leg press: seat 6 Bil 110# x25, Lt only 75# x25 Finger ladder: 1.5# added flexion and abduction 2x10 each Wall clocks: yellow loop 2x5 each Weight to shelf: flexion and scaption x 1 min each 3# weight  4D ball rolls: blue weighted ball 2x5 on Lt Open book stretch on wall:  Shoulder 3 way raises: 3# 2x10 on Lt  Knee flexion on step x10    PATIENT EDUCATION: Education details: [email or web address]  Person educated: Patient Education method: Explanation, Demonstration, and Handouts Education comprehension: verbalized understanding and returned demonstration  HOME EXERCISE PROGRAM: For Knee: Access Code: [email or web address]  Access Code: [email or web address] URL: [email or web address] Date: 07/15/2023 Prepared by: Tresa Endo  Exercises  - Supine Shoulder Flexion Extension AAROM with Dowel  - 2-3 x daily - 7 x weekly - 1 sets - 5-10 reps - 10 hold - Supine Shoulder External Rotation with Dowel  - 2-3 x daily - 7 x weekly - 1 sets - 5-10 reps - 10 hold - Shoulder Flexion Wall Slide with Towel  - 2 x daily - 7 x weekly - 1 sets - 10 reps - 10 hold - Standing Shoulder Abduction Slides at Wall  - 2 x daily - 7 x weekly - 1 sets - 10 reps - 10 hold - Standing Shoulder Internal Rotation Stretch with Towel  - 2 x daily - 7 x weekly - 1 sets - 10 reps - 10 hold - Sidelying Open Book Thoracic Lumbar Rotation and Extension  - 2 x daily - 7 x weekly - 1 sets - 10 reps - Shoulder Extension with Resistance - Palms Forward  - 1 x daily - 7 x weekly - 2 sets - 10 reps - Standing Row with Anchored Resistance  - 1 x daily - 7 x weekly - 2 sets -  10 reps - Standing Shoulder External Rotation with Resistance  - 1 x daily - 7 x weekly - 2 sets - 10 reps ASSESSMENT:  CLINICAL IMPRESSION: Pt is ready to D/C to HEP today.  He is independent and compliant with HEP.   DASH is improved to 9% and pt denies any limitation in use of Lt UE except  reaching behind the back. He continues to work on this at home with pulleys. IR is limited to T11. Lt knee A/ROM remains limited to 95 degrees and he will continue to work on this at home  and follow-up with MD as needed.    OBJECTIVE IMPAIRMENTS: Abnormal gait, decreased activity tolerance, decreased balance, decreased mobility, difficulty walking, decreased ROM, decreased strength, hypomobility, increased muscle spasms, impaired flexibility, impaired UE functional use, postural dysfunction, and pain.   ACTIVITY LIMITATIONS: carrying, lifting, squatting, stairs, transfers, reach over head, and locomotion level  PARTICIPATION LIMITATIONS: meal prep, cleaning, community activity, and occupation  PERSONAL FACTORS: 1-2 comorbidities: Rt TKA and lung cancer   are also affecting patient's functional outcome.   REHAB POTENTIAL: Good  CLINICAL DECISION MAKING: Stable/uncomplicated  EVALUATION COMPLEXITY: Low  GOALS: Goals reviewed with patient? Yes  SHORT TERM GOALS: Target date: 07/31/2023    Be independent in initial HEP Baseline: Goal status: MET  2.  Demonstrate Lt shoulder A/ROM flexion to > or = to 135 degrees for overhead reaching  Baseline: 144 (07/22/23) Goal status: MET  3.  Demonstrate Lt shoulder IR to L3 to improve dressing and self care Baseline: T12 (08/12/23) Goal status: MET  4.  Report < or = to 4/10 Lt shoulder pain with overhead reaching Baseline: 2-3/10 Goal status: MET   LONG TERM GOALS: Target date: 08/28/2023    Be independent in advanced HEP for knee and shoulder  Baseline:  Goal status: MET  2.  Knee: Demonstrate Lt knee A/ROM flexion to > or = to 110 degrees to  allow steps and squatting without compensation  Baseline: 97 (08/12/23) Goal status: Partially met  3.  Knee: Improve strength to negotiate steps with step-over step gait with use of rail to facilitate return to work  Baseline: step over step with ascending, step to with descending  (08/12/23) Goal status:partially met   4.  Improve Lt shoulder A/ROM flexion to > or = to 150 degrees to improve overhead reaching  Baseline: 145 (08/21/23) Goal status: partially met  5.  Improve DASH to < or = to 6% to improve functional use  Baseline: 9% (08/21/23) Goal status: partially met   6.  Report < or = to 3/10 Lt shoulder pain with lifting and overhead use Baseline: no significant pain (08/07/23) Goal status: MET  PLAN: PHYSICAL THERAPY DISCHARGE SUMMARY  Visits from Start of Care: 12  Current functional level related to goals / functional outcomes: See above for goal status.    Remaining deficits: Limited Lt knee flexion and Lt shoulder IR.  Pt has HEP to address remaining deficits.    Education / Equipment: HEP    Patient agrees to discharge. Patient goals were met. Patient is being discharged due to being pleased with the current functional level.    Lorrene Reid, PT 08/21/23 12:24 PM   [facility] [address], [address] [place], [place] [address] Phone # [phone] Fax [phone]

## 2023-12-24 ENCOUNTER — Ambulatory Visit: Attending: Internal Medicine

## 2023-12-24 ENCOUNTER — Other Ambulatory Visit: Payer: Self-pay

## 2023-12-24 DIAGNOSIS — M7502 Adhesive capsulitis of left shoulder: Secondary | ICD-10-CM | POA: Insufficient documentation

## 2023-12-24 DIAGNOSIS — R293 Abnormal posture: Secondary | ICD-10-CM

## 2023-12-24 DIAGNOSIS — G8929 Other chronic pain: Secondary | ICD-10-CM

## 2023-12-24 DIAGNOSIS — M25512 Pain in left shoulder: Secondary | ICD-10-CM | POA: Insufficient documentation

## 2023-12-24 DIAGNOSIS — M62838 Other muscle spasm: Secondary | ICD-10-CM | POA: Diagnosis not present

## 2023-12-24 DIAGNOSIS — M6281 Muscle weakness (generalized): Secondary | ICD-10-CM

## 2023-12-24 DIAGNOSIS — M25612 Stiffness of left shoulder, not elsewhere classified: Secondary | ICD-10-CM

## 2023-12-24 NOTE — Therapy (Signed)
 OUTPATIENT PHYSICAL THERAPY UPPER EXTREMITY EVALUATION   Patient Name: Jesse Stewart MRN: [id number] DOB:Sep 14, 1965, 58 y.o., male Today's Date: 12/24/2023  END OF SESSION:  PT End of Session - 12/24/23 1325     Visit Number 1    Number of Visits --    Authorization Type [facility]- 15 visits approved    Authorization - Visit Number 1    Authorization - Number of Visits 15    PT Start Time 1145    PT Stop Time 1225    PT Time Calculation (min) 40 min    Activity Tolerance Patient tolerated treatment well    Behavior During Therapy WFL for tasks assessed/performed           Past Medical History:  Diagnosis Date   Arthritis    Hypertension    Lung cancer (HCC) 03/13/2022   Past Surgical History:  Procedure Laterality Date   KNEE ARTHROSCOPY W/ MENISCAL REPAIR     LUNG REMOVAL, PARTIAL Right 03/27/2022   TOTAL KNEE ARTHROPLASTY Left 04/29/2023   Patient Active Problem List   Diagnosis Date Noted   Colon polyps 05/16/2019   Obesity (BMI 30.0-34.9) 05/13/2019   Chest tightness 10/06/2014   COVID 10/06/2014   Hematochezia 12/07/2012   Hernia of abdominal wall 12/07/2012   Well adult exam 04/23/2012   Neoplasm of uncertain behavior of skin 05/04/2010   ACTINIC KERATOSIS 05/04/2010   Dyslipidemia 04/04/2008   GERD 04/04/2008   Osteoarthritis 04/04/2008   KNEE PAIN 04/04/2008   ABNORMAL GLUCOSE NEC 04/04/2008   ABNORMAL LIVER FUNCTION TESTS 04/04/2008   Essential hypertension 12/29/2006    PCP: Garald Karlynn GAILS, MD   REFERRING PROVIDER: Lanell Royals, PA  REFERRING DIAG: Left Adhesive capsulitis  THERAPY DIAG:  Pain in Left shoulder  Rationale for Evaluation and Treatment: Rehabilitation  ONSET DATE: 1 month ago  SUBJECTIVE:   SUBJECTIVE STATEMENT:  Pt states he received PT for his left shoulder earlier this year, that improved his mobility and strength. However, recently within past month pt is experiencing a flare up of pain along with  significantly decreased left shoulder ROM. Pt returned back to his PCP for a check up. PCP recommended he return back to PT in hopes pt does not require surgery. Pt stated he does not want surgery. He states he has difficulty and pain reaching left arm behind his back and has night pain that wakes him up. He states he sleeps on his left side.    PERTINENT HISTORY: Lt TKA 04/29/23, Lung cancer with lung resection 03/2022 PAIN: 12/24/23:  Are you having pain? Yes: NPRS scale: 0-3/10 Pain location: Anterior shoulder, shoots down  Pain description: sharp, shooting  Aggravating factors: trying to stretch out, overhead like painting  Relieving factors: ibuprofen , aloxican hasn't taken, cream doc gave for post op knee   PRECAUTIONS: Other: history of lung cancer   RED FLAGS: None   WEIGHT BEARING RESTRICTIONS: No  FALLS:  Has patient fallen in last 6 months? 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   PLOF: Independent, Vocation/Vocational requirements: see above, and Leisure: hike, walk, beach   PATIENT GOALS: reach all the way over and get rid of the pain as much as  possible  Diagnostic: MRI on 06/27/23:  IMPRESSION:  1. Articular-sided partial tear of the supraspinatus tendon approaching 50% thickness, measuring 2 cm anterior-posterior. Background of tendinosis and surface fraying.  2. Subscapularis tendinosis and low-grade partial tear.  3. Intra-articular biceps tendinosis, possible intrasubstance partial tear near the biceps-labral complex.  4. Imaging findings which may be seen with adhesive capsulitis.  5. Moderate AC joint arthrosis.   NEXT MD VISIT: November  2025  OBJECTIVE:  Note: Objective measures were completed at Evaluation unless otherwise noted.  COGNITION: Overall cognitive status: Within functional limits for tasks assessed     SENSATION: WFL   POSTURE: Rounded shoulders and forward head posture  PALPATION: Muscle tightness in bilateral upper traps, rotator cuff muscles on left side  joint hypomobility with A-P glides, and inferior glides to left shoulder Decreased scapular mobility on left side UPPER EXTREMITY ROM:  AROM Left  Right  Flexion  128 WNL  Abduction* 90 WNL  IR Lacking 8 inches vs Rt WNL  ER Lacking 6 inches vs Rt WNL   PROM   Left: Flexion=116, Abduction*=80    (Blank rows = not tested)  UPPER EXTREMITY MMT:  MMT Right eval Left eval  Shoulder flexion   4 4  Shoulder Abd* 4 4-  ER 4 4-  IR 4 4-  Extension 4 4-                               (Blank rows = not tested)                                                                                                                    TREATMENT DATE:  12/24/23 HEP established- see below   Dry needling pt education (handout not provided) AROM shoulder retraction   PATIENT EDUCATION:  Education details: Access Code: [email or web address] Person educated: Patient Education method: Explanation, Demonstration, and Handouts Education comprehension: verbalized understanding and returned demonstration  HOME EXERCISE PROGRAM:  Access Code: [email or web address] URL: [email or web address] Date: 12/24/2023 Prepared by: Burnard  Exercises - Seated Shoulder Flexion AAROM with Pulley Behind  - 3 x daily - 7 x weekly - 3 sets - 10 reps - Seated Shoulder Scaption AAROM with Pulley at Side  - 3 x daily - 7 x weekly - 3 sets - 10 reps - Seated Shoulder Internal Rotation AAROM with Pulley  - 1 x daily - 7 x weekly - 3 sets - 10 reps - Shoulder Flexion Wall Slide with Towel  - 2 x daily - 7 x weekly - 1 sets - 10 reps - 10 hold - Standing Shoulder Abduction  Slides at Wall  - 1 x daily - 7 x weekly - 1 sets - 10 reps - 10 hold - Supine Shoulder External Rotation with Dowel  - 2 x daily - 7 x weekly - 2 sets - 10 reps     ASSESSMENT:  CLINICAL IMPRESSION: Patient is 58 year old male  presenting to PT with complaints of left shoulder pain and significantly decreased left shoulder ROM. Pt states he recently retired and hasn't been working consistently, however stays active by performing house working tasks. Pt demonstrated muscle tightness in upper traps, and decreased scapular mobility on left side. Pt demonstrated painful arc throughout today's assessment, and stated his doctor  and MRI indicated he has 50% partial rotator cuff tear. Pt demonstrates joint hypomobility with A-P glides, and inferior glides to left shoulder, and also decreased left shoulder strength in rotator cuff muscles and deltoids. Pt would benefit from starting skilled PT back up again from his previous visits to address the deficits below and move closer to his goals.   OBJECTIVE IMPAIRMENTS: Muscle spasm, decreased shoulder mobility, decreased shoulder strength, postural dysfunction, left shoulder pain, decreased functional capacity, pain with reaching behind back,   ACTIVITY LIMITATIONS: Reaching over head, abducting left shoulder beyond 90 deg, reaching behind back, sleeping  PARTICIPATION LIMITATIONS: cleaning, driving, community activity, occupation, and yard work  PERSONAL FACTORS: 1-2 comorbidities: Lt TKA, lung cancer  are also affecting patient's functional outcome.   REHAB POTENTIAL: Good  CLINICAL DECISION MAKING: Stable/uncomplicated  EVALUATION COMPLEXITY: Low   GOALS: Goals reviewed with patient? Yes  SHORT TERM GOALS: Target date: 01/23/2024   Be independent in initial HEP Baseline: Goal status: INITIAL  2.  Demonstrate Lt shouler A/ROM flexion to > or = to 140 degrees to allow for reaching overhead to paint and put dishes away in cabinets.  Baseline:  128 Goal status: INITIAL  3.  Improve Lt shoulder A/ROM IR to lacking < or = to 5 inches vs the Rt to improve self-care Baseline: 8 difference Goal status: INITIAL  4.   Improve strength 4+/5 to lift and carry objects overhead pain free.  Baseline: 4- Goal status: INITIAL    LONG TERM GOALS: Target date: 02/18/2024     Be independent in advanced HEP Baseline:  Goal status: INITIAL  2.  Improve Lt shoulder A/ROM ER to lacking < or = to 3 inches vs the Rt to improve self-care Baseline:  Goal status: INITIAL  3.  Demonstrate Lt Shouler A/ROM flexion to > or = to 150 degrees to allow reaching overhead to paint without compensation  Baseline: 128 Goal status: INITIAL  4.  Demonstrate symmetry with bilateral AROM Abduction without compensation due to increased strength and ROM Baseline:  Goal status: INITIAL  5.  Improve strength in left shoulder to complete house work such as fixing overhead lights, paint, etc with > or = to 50% less Lt UE pain Baseline: 4-  and 3/10 pain Goal status: INITIAL  6.  Reach behind back WNL to be able to reach for seat behind while driving, reach for wallet in back pocket.  Baseline: lacking 8 inches Goal status: INITIAL   PLAN:  PT FREQUENCY: 2x/week  PT DURATION: 8 weeks  PLANNED INTERVENTIONS: 97110-Therapeutic exercises, 97530- Therapeutic activity, [id number]- Neuromuscular re-education, 97535- Self Care, [id number]- Manual therapy, [id number]- Gait training, [phone]- Aquatic Therapy, 97014- Electrical stimulation (unattended), [phone]- Electrical stimulation (manual), 97016- Vasopneumatic device, 97035- Ultrasound, Patient/Family education, Balance training, Taping, Joint mobilization, Joint manipulation, Scar mobilization, Vestibular training, and Cryotherapy  PLAN FOR NEXT SESSION: Lt shoulder ROM, strength, manual for ROM gains, dry needling to Lt scapular and shoulder musculature, postural strength, game ready  Lavanda Cleverly, SPT 12/24/23 2:09  PM I agree with the following treatment note after reviewing documentation. This session was performed under the supervision of a licensed clinician.  Burnard Joy, PT 12/24/23 2:09 PM   [facility] [address], [address] [place], [place] [address] Phone # [phone] Fax [phone]

## 2023-12-30 NOTE — Therapy (Signed)
 OUTPATIENT PHYSICAL THERAPY UPPER EXTREMITY TREATMENT    Patient Name: Jesse Stewart MRN: [id number] DOB:[birth year], 58 y.o., male Today's Date: 12/31/2023  END OF SESSION:  PT End of Session - 12/31/23 1014     Visit Number 2    Authorization Type [facility]- 15 visits approved    Authorization - Visit Number 2    Authorization - Number of Visits 15    PT Start Time 1014    PT Stop Time 1058    PT Time Calculation (min) 44 min    Activity Tolerance Patient tolerated treatment well    Behavior During Therapy WFL for tasks assessed/performed            Past Medical History:  Diagnosis Date   Arthritis    Hypertension    Lung cancer (HCC) 03/13/2022   Past Surgical History:  Procedure Laterality Date   KNEE ARTHROSCOPY W/ MENISCAL REPAIR     LUNG REMOVAL, PARTIAL Right 03/27/2022   TOTAL KNEE ARTHROPLASTY Left 04/29/2023   Patient Active Problem List   Diagnosis Date Noted   Colon polyps 05/16/2019   Obesity (BMI 30.0-34.9) 05/13/2019   Chest tightness 10/06/2014   COVID 10/06/2014   Hematochezia 12/07/2012   Hernia of abdominal wall 12/07/2012   Well adult exam 04/23/2012   Neoplasm of uncertain behavior of skin 05/04/2010   ACTINIC KERATOSIS 05/04/2010   Dyslipidemia 04/04/2008   GERD 04/04/2008   Osteoarthritis 04/04/2008   KNEE PAIN 04/04/2008   ABNORMAL GLUCOSE NEC 04/04/2008   ABNORMAL LIVER FUNCTION TESTS 04/04/2008   Essential hypertension 12/29/2006    PCP: Garald Karlynn GAILS, MD   REFERRING PROVIDER: Lanell Royals, PA  REFERRING DIAG: Left Adhesive capsulitis  THERAPY DIAG:  Pain in Left shoulder  Rationale for Evaluation and Treatment: Rehabilitation  ONSET DATE: 1 month ago  SUBJECTIVE:   SUBJECTIVE STATEMENT: Most of my pain is at night when I'm sleeping.   Eval: Pt states he received PT for his left shoulder earlier this year, that improved his mobility and strength. However, recently within past month pt is  experiencing a flare up of pain along with significantly decreased left shoulder ROM. Pt returned back to his PCP for a check up. PCP recommended he return back to PT in hopes pt does not require surgery. Pt stated he does not want surgery. He states he has difficulty and pain reaching left arm behind his back and has night pain that wakes him up. He states he sleeps on his left side.    PERTINENT HISTORY: Lt TKA 04/29/23, Lung cancer with lung resection 03/2022 PAIN: 12/24/23:  Are you having pain? Yes: NPRS scale: 0-3/10 Pain location: Anterior shoulder, shoots down  Pain description: sharp, shooting  Aggravating factors: trying to stretch out, overhead like painting  Relieving factors: ibuprofen , aloxican hasn't taken, cream doc gave for post op knee   PRECAUTIONS: Other: history of lung cancer   RED FLAGS: None   WEIGHT BEARING RESTRICTIONS: No  FALLS:  Has patient fallen in last 6 months? 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   PLOF: Independent, Vocation/Vocational requirements: see above, and Leisure: hike, walk, beach   PATIENT GOALS: reach all the way over and get  rid of the pain as much as possible  Diagnostic: MRI on 06/27/23:  IMPRESSION:  1. Articular-sided partial tear of the supraspinatus tendon approaching 50% thickness, measuring 2 cm anterior-posterior. Background of tendinosis and surface fraying.  2. Subscapularis tendinosis and low-grade partial tear.  3. Intra-articular biceps tendinosis, possible intrasubstance partial tear near the biceps-labral complex.  4. Imaging findings which may be seen with adhesive capsulitis.  5. Moderate AC joint  arthrosis.   NEXT MD VISIT: November 2025  OBJECTIVE:  Note: Objective measures were completed at Evaluation unless otherwise noted.  COGNITION: Overall cognitive status: Within functional limits for tasks assessed     SENSATION: WFL   POSTURE: Rounded shoulders and forward head posture  PALPATION: Muscle tightness in bilateral upper traps, rotator cuff muscles on left side  joint hypomobility with A-P glides, and inferior glides to left shoulder Decreased scapular mobility on left side UPPER EXTREMITY ROM:  AROM Left  Right Left  8/20 post manual (seated)  Flexion  128 WNL 134  Abduction* 90 WNL 95  IR Lacking 8 inches vs Rt WNL   ER Lacking 6 inches vs Rt WNL    PROM   Left: Flexion=116, Abduction*=80    (Blank rows = not tested)  UPPER EXTREMITY MMT:  MMT Right eval Left eval  Shoulder flexion   4 4  Shoulder Abd* 4 4-  ER 4 4-  IR 4 4-  Extension 4 4-                               (Blank rows = not tested)                                                                                                                    TREATMENT DATE:  12/31/23 Flex/ABD wall slides x 10 ea Standing 3 way raises 2# x 10 ea some pain with ABD Sleeper stretch x 30 sec ea at wall and in SL   Trigger Point Dry Needling  Initial Treatment: Pt instructed on Dry Needling rational, procedures, and possible side effects. Pt instructed to expect mild to moderate muscle soreness later in the day and/or into the next day.  Pt instructed in methods to reduce muscle soreness. Pt instructed to continue prescribed HEP. Because Dry Needling was performed over or adjacent to a lung field, pt was educated on S/S of pneumothorax and to seek immediate medical attention should they occur.  Patient was educated on signs and symptoms of infection and other risk factors and advised to seek medical attention should they occur.  Patient verbalized understanding of these instructions and  education.   Patient Verbal Consent Given: Yes Education Handout Provided: Yes Muscles Treated: L UT, LS, infraspinatus, teres, lats, subscap and pecs Electrical Stimulation Performed: No Treatment Response/Outcome: Utilized skilled palpation to identify bony landmarks and trigger points.  Able to illicit twitch response and muscle elongation.  Soft tissue mobilization to muscles needled to further  promote tissue elongation and decreased pain.   \  Inferior, PA and AP mobs to left shoulder post DN with PROM into flexion and and IR.       12/24/23 HEP established- see below   Dry needling pt education (handout not provided) AROM shoulder retraction   PATIENT EDUCATION:  Education details: Access Code: [email or web address] Person educated: Patient Education method: Explanation, Demonstration, and Handouts Education comprehension: verbalized understanding and returned demonstration  HOME EXERCISE PROGRAM:  Access Code: [email or web address] URL: [email or web address] Date: 12/31/2023 Prepared by: Mliss  Exercises - Seated Shoulder Flexion AAROM with Pulley Behind  - 3 x daily - 7 x weekly - 3 sets - 10 reps - Seated Shoulder Scaption AAROM with Pulley at Side  - 3 x daily - 7 x weekly - 3 sets - 10 reps - Seated Shoulder Internal Rotation AAROM with Pulley  - 1 x daily - 7 x weekly - 3 sets - 10 reps - Shoulder Flexion Wall Slide with Towel  - 2 x daily - 7 x weekly - 1 sets - 10 reps - 10 hold - Standing Shoulder Abduction Slides at Wall  - 1 x daily - 7 x weekly - 1 sets - 10 reps - 10 hold - Supine Shoulder External Rotation with Dowel  - 2 x daily - 7 x weekly - 2 sets - 10 reps - Standing Sleeper Stretch at [employer]  - 1 x daily - 3 x weekly - 1 sets - 3 reps - 30 hold - Sleeper Stretch  - 2 x daily - 7 x weekly - 1 sets - 3 reps - 30-60 sec hold     ASSESSMENT:  CLINICAL IMPRESSION: Initial trial of DN completed today with excellent twitch responses in subscapularis, lats and  teres primarily. Good responses elsewhere with decreased tissue tension noted. Patient demonstrated improved active flexion and ABD immediately following. Added sleeper stretch to HEP to address IR deficits.    OBJECTIVE IMPAIRMENTS: Muscle spasm, decreased shoulder mobility, decreased shoulder strength, postural dysfunction, left shoulder pain, decreased functional capacity, pain with reaching behind back,   ACTIVITY LIMITATIONS: Reaching over head, abducting left shoulder beyond 90 deg, reaching behind back, sleeping  PARTICIPATION LIMITATIONS: cleaning, driving, community activity, occupation, and yard work  PERSONAL FACTORS: 1-2 comorbidities: Lt TKA, lung cancer  are also affecting patient's functional outcome.   REHAB POTENTIAL: Good  CLINICAL DECISION MAKING: Stable/uncomplicated  EVALUATION COMPLEXITY: Low   GOALS: Goals reviewed with patient? Yes  SHORT TERM GOALS: Target date: 01/23/2024   Be independent in initial HEP Baseline: Goal status: INITIAL  2.  Demonstrate Lt shouler A/ROM flexion to > or = to 140 degrees to allow for reaching overhead to paint and put dishes away in cabinets.  Baseline: 128 Goal status: INITIAL  3.  Improve Lt shoulder A/ROM IR to lacking < or = to 5 inches vs the Rt to improve self-care Baseline: 8 difference Goal status: INITIAL  4.   Improve strength 4+/5 to lift and carry objects overhead pain free.  Baseline: 4- Goal status: INITIAL    LONG TERM GOALS: Target date: 02/18/2024     Be independent in advanced HEP Baseline:  Goal status: INITIAL  2.  Improve Lt shoulder A/ROM ER to lacking < or = to 3 inches vs the Rt to improve self-care Baseline:  Goal status: INITIAL  3.  Demonstrate Lt Shouler A/ROM flexion to > or = to 150 degrees to allow reaching overhead to paint without compensation  Baseline:  128 Goal status: INITIAL  4.  Demonstrate symmetry with bilateral AROM Abduction without compensation due to increased  strength and ROM Baseline:  Goal status: INITIAL  5.  Improve strength in left shoulder to complete house work such as fixing overhead lights, paint, etc with > or = to 50% less Lt UE pain Baseline: 4-  and 3/10 pain Goal status: INITIAL  6.  Reach behind back WNL to be able to reach for seat behind while driving, reach for wallet in back pocket.  Baseline: lacking 8 inches Goal status: INITIAL   PLAN:  PT FREQUENCY: 2x/week  PT DURATION: 8 weeks  PLANNED INTERVENTIONS: 97110-Therapeutic exercises, 97530- Therapeutic activity, 97112- Neuromuscular re-education, 97535- Self Care, 02859- Manual therapy, [phone]- Gait training, [phone]- Aquatic Therapy, 97014- Electrical stimulation (unattended), [phone]- Electrical stimulation (manual), 97016- Vasopneumatic device, 97035- Ultrasound, Patient/Family education, Balance training, Taping, Joint mobilization, Joint manipulation, Scar mobilization, Vestibular training, and Cryotherapy  PLAN FOR NEXT SESSION: Assess DN, Lt shoulder ROM, strength, manual for ROM gains, dry needling to Lt scapular and shoulder musculature, postural strength, game ready  Mliss Cummins, PT 12/31/23 2:09 PM   [facility] [address], [address] [place], [place] [address] Phone # [phone] Fax [phone]

## 2023-12-31 ENCOUNTER — Ambulatory Visit: Admitting: Physical Therapy

## 2023-12-31 ENCOUNTER — Encounter: Payer: Self-pay | Admitting: Physical Therapy

## 2023-12-31 DIAGNOSIS — M7502 Adhesive capsulitis of left shoulder: Secondary | ICD-10-CM | POA: Diagnosis not present

## 2023-12-31 DIAGNOSIS — M25612 Stiffness of left shoulder, not elsewhere classified: Secondary | ICD-10-CM

## 2023-12-31 DIAGNOSIS — G8929 Other chronic pain: Secondary | ICD-10-CM

## 2023-12-31 DIAGNOSIS — R293 Abnormal posture: Secondary | ICD-10-CM

## 2023-12-31 DIAGNOSIS — M6281 Muscle weakness (generalized): Secondary | ICD-10-CM

## 2023-12-31 NOTE — Patient Instructions (Addendum)

## 2024-01-01 ENCOUNTER — Ambulatory Visit

## 2024-01-01 DIAGNOSIS — M6281 Muscle weakness (generalized): Secondary | ICD-10-CM

## 2024-01-01 DIAGNOSIS — M7502 Adhesive capsulitis of left shoulder: Secondary | ICD-10-CM | POA: Diagnosis not present

## 2024-01-01 DIAGNOSIS — M25612 Stiffness of left shoulder, not elsewhere classified: Secondary | ICD-10-CM

## 2024-01-01 DIAGNOSIS — G8929 Other chronic pain: Secondary | ICD-10-CM

## 2024-01-01 DIAGNOSIS — R252 Cramp and spasm: Secondary | ICD-10-CM

## 2024-01-01 NOTE — Therapy (Signed)
 OUTPATIENT PHYSICAL THERAPY UPPER EXTREMITY TREATMENT    Patient Name: Jesse Stewart MRN: [id number] DOB:[birth year], 58 y.o., male Today's Date: 01/01/2024  END OF SESSION:  PT End of Session - 01/01/24 0855     Visit Number 3    Number of Visits 15    Authorization Type VA- 15 visits approved    Authorization - Visit Number 3    Authorization - Number of Visits 15    PT Start Time 0850    PT Stop Time 0938    PT Time Calculation (min) 48 min    Activity Tolerance Patient tolerated treatment well    Behavior During Therapy [facility] for tasks assessed/performed            Past Medical History:  Diagnosis Date   Arthritis    Hypertension    Lung cancer (HCC) 03/13/2022   Past Surgical History:  Procedure Laterality Date   KNEE ARTHROSCOPY W/ MENISCAL REPAIR     LUNG REMOVAL, PARTIAL Right 03/27/2022   TOTAL KNEE ARTHROPLASTY Left 04/29/2023   Patient Active Problem List   Diagnosis Date Noted   Colon polyps 05/16/2019   Obesity (BMI 30.0-34.9) 05/13/2019   Chest tightness 10/06/2014   COVID 10/06/2014   Hematochezia 12/07/2012   Hernia of abdominal wall 12/07/2012   Well adult exam 04/23/2012   Neoplasm of uncertain behavior of skin 05/04/2010   ACTINIC KERATOSIS 05/04/2010   Dyslipidemia 04/04/2008   GERD 04/04/2008   Osteoarthritis 04/04/2008   KNEE PAIN 04/04/2008   ABNORMAL GLUCOSE NEC 04/04/2008   ABNORMAL LIVER FUNCTION TESTS 04/04/2008   Essential hypertension 12/29/2006    PCP: Garald Karlynn GAILS, MD   REFERRING PROVIDER: Lanell Royals, PA  REFERRING DIAG: Left Adhesive capsulitis  THERAPY DIAG:  Pain in Left shoulder  Rationale for Evaluation and Treatment: Rehabilitation  ONSET DATE: 1 month ago  SUBJECTIVE:   SUBJECTIVE STATEMENT: Most of my pain is at night when I'm sleeping.   Eval: Pt states he received PT for his left shoulder earlier this year, that improved his mobility and strength. However, recently within  past month pt is experiencing a flare up of pain along with significantly decreased left shoulder ROM. Pt returned back to his PCP for a check up. PCP recommended he return back to PT in hopes pt does not require surgery. Pt stated he does not want surgery. He states he has difficulty and pain reaching left arm behind his back and has night pain that wakes him up. He states he sleeps on his left side.    PERTINENT HISTORY: Lt TKA 04/29/23, Lung cancer with lung resection 03/2022 PAIN: 12/24/23:  Are you having pain? Yes: NPRS scale: 0-3/10 Pain location: Anterior shoulder, shoots down  Pain description: sharp, shooting  Aggravating factors: trying to stretch out, overhead like painting  Relieving factors: ibuprofen , aloxican hasn't taken, cream doc gave for post op knee   PRECAUTIONS: Other: history of lung cancer   RED FLAGS: None   WEIGHT BEARING RESTRICTIONS: No  FALLS:  Has patient fallen in last 6 months? 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   PLOF: Independent, Vocation/Vocational requirements: see above, and Leisure: hike, walk, beach   PATIENT GOALS:  reach all the way over and get rid of the pain as much as possible  Diagnostic: MRI on 06/27/23:  IMPRESSION:  1. Articular-sided partial tear of the supraspinatus tendon approaching 50% thickness, measuring 2 cm anterior-posterior. Background of tendinosis and surface fraying.  2. Subscapularis tendinosis and low-grade partial tear.  3. Intra-articular biceps tendinosis, possible intrasubstance partial tear near the biceps-labral complex.  4. Imaging findings which may be seen with adhesive capsulitis.  5.  Moderate AC joint arthrosis.   NEXT MD VISIT: November 2025  OBJECTIVE:  Note: Objective measures were completed at Evaluation unless otherwise noted.  COGNITION: Overall cognitive status: Within functional limits for tasks assessed     SENSATION: WFL   POSTURE: Rounded shoulders and forward head posture  PALPATION: Muscle tightness in bilateral upper traps, rotator cuff muscles on left side  joint hypomobility with A-P glides, and inferior glides to left shoulder Decreased scapular mobility on left side UPPER EXTREMITY ROM:  AROM Left  Right Left  8/20 post manual (seated)  Flexion  128 WNL 134  Abduction* 90 WNL 95  IR Lacking 8 inches vs Rt WNL   ER Lacking 6 inches vs Rt WNL    PROM   Left: Flexion=116, Abduction*=80    (Blank rows = not tested)  UPPER EXTREMITY MMT:  MMT Right eval Left eval  Shoulder flexion   4 4  Shoulder Abd* 4 4-  ER 4 4-  IR 4 4-  Extension 4 4-                               (Blank rows = not tested)                                                                                                                    TREATMENT DATE:  01/01/24 Visual check of AROM seated Initiated prone shoulder ext, rows and horizontal abduction x 20 each with 3 lb left Side lying ER with 3 lbs x 20 left Supine serratus punch with 3 lbs x 20 left Added side lying sleeper and cross body stretch lying on left side x 5 each holding 10 sec each Reviewed AAROM with dowel x 5-10 each vc's for correct technique  12/31/23 Flex/ABD wall slides x 10 ea Standing 3 way raises 2# x 10 ea some pain with ABD Sleeper stretch x 30 sec ea at wall and in SL   Trigger Point Dry Needling  Initial Treatment: Pt instructed on Dry Needling rational, procedures, and possible side effects. Pt instructed to expect mild to moderate muscle soreness later in the day and/or into the next day.  Pt instructed in methods to reduce muscle soreness. Pt instructed to  continue prescribed HEP. Because Dry Needling was performed over or adjacent to a lung field, pt was educated on S/S of pneumothorax and to seek immediate medical attention should they occur.  Patient was educated on signs and symptoms of infection and  other risk factors and advised to seek medical attention should they occur.  Patient verbalized understanding of these instructions and education.   Patient Verbal Consent Given: Yes Education Handout Provided: Yes Muscles Treated: L UT, LS, infraspinatus, teres, lats, subscap and pecs Electrical Stimulation Performed: No Treatment Response/Outcome: Utilized skilled palpation to identify bony landmarks and trigger points.  Able to illicit twitch response and muscle elongation.  Soft tissue mobilization to muscles needled to further promote tissue elongation and decreased pain.   \  Inferior, PA and AP mobs to left shoulder post DN with PROM into flexion and and IR.       12/24/23 HEP established- see below   Dry needling pt education (handout not provided) AROM shoulder retraction   PATIENT EDUCATION:  Education details: Access Code: [email or web address] Person educated: Patient Education method: Explanation, Demonstration, and Handouts Education comprehension: verbalized understanding and returned demonstration  HOME EXERCISE PROGRAM:  Access Code: [email or web address] URL: [email or web address] Date: 12/31/2023 Prepared by: Mliss  Exercises - Seated Shoulder Flexion AAROM with Pulley Behind  - 3 x daily - 7 x weekly - 3 sets - 10 reps - Seated Shoulder Scaption AAROM with Pulley at Side  - 3 x daily - 7 x weekly - 3 sets - 10 reps - Seated Shoulder Internal Rotation AAROM with Pulley  - 1 x daily - 7 x weekly - 3 sets - 10 reps - Shoulder Flexion Wall Slide with Towel  - 2 x daily - 7 x weekly - 1 sets - 10 reps - 10 hold - Standing Shoulder Abduction Slides at Wall  - 1 x daily - 7 x weekly - 1 sets - 10 reps - 10 hold - Supine  Shoulder External Rotation with Dowel  - 2 x daily - 7 x weekly - 2 sets - 10 reps - Standing Sleeper Stretch at [employer]  - 1 x daily - 3 x weekly - 1 sets - 3 reps - 30 hold - Sleeper Stretch  - 2 x daily - 7 x weekly - 1 sets - 3 reps - 30-60 sec hold     ASSESSMENT:  CLINICAL IMPRESSION: Initial trial of DN completed today with excellent twitch responses in subscapularis, lats and teres primarily. Good responses elsewhere with decreased tissue tension noted. Patient demonstrated improved active flexion and ABD immediately following. Added sleeper stretch to HEP to address IR deficits.    OBJECTIVE IMPAIRMENTS: Muscle spasm, decreased shoulder mobility, decreased shoulder strength, postural dysfunction, left shoulder pain, decreased functional capacity, pain with reaching behind back,   ACTIVITY LIMITATIONS: Reaching over head, abducting left shoulder beyond 90 deg, reaching behind back, sleeping  PARTICIPATION LIMITATIONS: cleaning, driving, community activity, occupation, and yard work  PERSONAL FACTORS: 1-2 comorbidities: Lt TKA, lung cancer  are also affecting patient's functional outcome.   REHAB POTENTIAL: Good  CLINICAL DECISION MAKING: Stable/uncomplicated  EVALUATION COMPLEXITY: Low   GOALS: Goals reviewed with patient? Yes  SHORT TERM GOALS: Target date: 01/23/2024   Be independent in initial HEP Baseline: Goal status: INITIAL  2.  Demonstrate Lt shouler A/ROM flexion to > or = to 140 degrees to allow for reaching overhead to paint and put dishes away in cabinets.  Baseline: 128 Goal status: INITIAL  3.  Improve Lt shoulder A/ROM IR to lacking < or = to 5 inches vs the Rt to improve self-care Baseline: 8 difference Goal status: INITIAL  4.   Improve strength 4+/5 to lift and carry objects overhead pain free.  Baseline: 4- Goal  status: INITIAL    LONG TERM GOALS: Target date: 02/18/2024     Be independent in advanced HEP Baseline:  Goal status:  INITIAL  2.  Improve Lt shoulder A/ROM ER to lacking < or = to 3 inches vs the Rt to improve self-care Baseline:  Goal status: INITIAL  3.  Demonstrate Lt Shouler A/ROM flexion to > or = to 150 degrees to allow reaching overhead to paint without compensation  Baseline: 128 Goal status: INITIAL  4.  Demonstrate symmetry with bilateral AROM Abduction without compensation due to increased strength and ROM Baseline:  Goal status: INITIAL  5.  Improve strength in left shoulder to complete house work such as fixing overhead lights, paint, etc with > or = to 50% less Lt UE pain Baseline: 4-  and 3/10 pain Goal status: INITIAL  6.  Reach behind back WNL to be able to reach for seat behind while driving, reach for wallet in back pocket.  Baseline: lacking 8 inches Goal status: INITIAL   PLAN:  PT FREQUENCY: 2x/week  PT DURATION: 8 weeks  PLANNED INTERVENTIONS: 97110-Therapeutic exercises, 97530- Therapeutic activity, 97112- Neuromuscular re-education, 97535- Self Care, 02859- Manual therapy, [phone]- Gait training, [phone]- Aquatic Therapy, 97014- Electrical stimulation (unattended), [phone]- Electrical stimulation (manual), 97016- Vasopneumatic device, 97035- Ultrasound, Patient/Family education, Balance training, Taping, Joint mobilization, Joint manipulation, Scar mobilization, Vestibular training, and Cryotherapy  PLAN FOR NEXT SESSION: Assess DN, Lt shoulder ROM, strength, manual for ROM gains, dry needling to Lt scapular and shoulder musculature, postural strength, game ready  Mliss Cummins, PT 01/01/24 9:31 AM   [facility] [address], [address] [place], [place] [address] Phone # [phone] Fax [phone]

## 2024-01-07 ENCOUNTER — Ambulatory Visit

## 2024-01-07 DIAGNOSIS — R252 Cramp and spasm: Secondary | ICD-10-CM

## 2024-01-07 DIAGNOSIS — R293 Abnormal posture: Secondary | ICD-10-CM

## 2024-01-07 DIAGNOSIS — M7502 Adhesive capsulitis of left shoulder: Secondary | ICD-10-CM | POA: Diagnosis not present

## 2024-01-07 DIAGNOSIS — M25612 Stiffness of left shoulder, not elsewhere classified: Secondary | ICD-10-CM

## 2024-01-07 DIAGNOSIS — M6281 Muscle weakness (generalized): Secondary | ICD-10-CM

## 2024-01-07 DIAGNOSIS — G8929 Other chronic pain: Secondary | ICD-10-CM

## 2024-01-07 NOTE — Therapy (Signed)
 OUTPATIENT PHYSICAL THERAPY UPPER EXTREMITY TREATMENT    Patient Name: Jesse Stewart MRN: [id number] DOB:03-31-1966, 58 y.o., male Today's Date: 01/07/2024  END OF SESSION:  PT End of Session - 01/07/24 1020     Visit Number 4    Date for PT Re-Evaluation 02/18/24    Authorization Type [facility]- 15 visits approved    Authorization - Visit Number 4    Authorization - Number of Visits 15    PT Start Time 0935    PT Stop Time 1015    PT Time Calculation (min) 40 min    Activity Tolerance Patient tolerated treatment well    Behavior During Therapy [facility] for tasks assessed/performed             Past Medical History:  Diagnosis Date   Arthritis    Hypertension    Lung cancer (HCC) 03/13/2022   Past Surgical History:  Procedure Laterality Date   KNEE ARTHROSCOPY W/ MENISCAL REPAIR     LUNG REMOVAL, PARTIAL Right 03/27/2022   TOTAL KNEE ARTHROPLASTY Left 04/29/2023   Patient Active Problem List   Diagnosis Date Noted   Colon polyps 05/16/2019   Obesity (BMI 30.0-34.9) 05/13/2019   Chest tightness 10/06/2014   COVID 10/06/2014   Hematochezia 12/07/2012   Hernia of abdominal wall 12/07/2012   Well adult exam 04/23/2012   Neoplasm of uncertain behavior of skin 05/04/2010   ACTINIC KERATOSIS 05/04/2010   Dyslipidemia 04/04/2008   GERD 04/04/2008   Osteoarthritis 04/04/2008   KNEE PAIN 04/04/2008   ABNORMAL GLUCOSE NEC 04/04/2008   ABNORMAL LIVER FUNCTION TESTS 04/04/2008   Essential hypertension 12/29/2006    PCP: Garald Karlynn GAILS, MD   REFERRING PROVIDER: Lanell Royals, PA  REFERRING DIAG: Left Adhesive capsulitis  THERAPY DIAG:  Pain in Left shoulder  Rationale for Evaluation and Treatment: Rehabilitation  ONSET DATE: 1 month ago  SUBJECTIVE:   SUBJECTIVE STATEMENT: I feel better after dry needling.  I've been doing my pulleys more frequently,   Eval: Pt states he received PT for his left shoulder earlier this year, that improved his  mobility and strength. However, recently within past month pt is experiencing a flare up of pain along with significantly decreased left shoulder ROM. Pt returned back to his PCP for a check up. PCP recommended he return back to PT in hopes pt does not require surgery. Pt stated he does not want surgery. He states he has difficulty and pain reaching left arm behind his back and has night pain that wakes him up. He states he sleeps on his left side.    PERTINENT HISTORY: Lt TKA 04/29/23, Lung cancer with lung resection 03/2022 PAIN: 01/07/24:  Are you having pain? Yes: NPRS scale: 0-7/10 Pain location: Anterior shoulder, shoots down  Pain description: sharp, shooting  Aggravating factors: trying to stretch out, overhead like painting  Relieving factors: ibuprofen , aloxican hasn't taken, cream doc gave for post op knee   PRECAUTIONS: Other: history of lung cancer   RED FLAGS: None   WEIGHT BEARING RESTRICTIONS: No  FALLS:  Has patient fallen in last 6 months? 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   PLOF: Independent, Vocation/Vocational requirements: see above, and Leisure: hike,  walk, beach   PATIENT GOALS: reach all the way over and get rid of the pain as much as possible  Diagnostic: MRI on 06/27/23:  IMPRESSION:  1. Articular-sided partial tear of the supraspinatus tendon approaching 50% thickness, measuring 2 cm anterior-posterior. Background of tendinosis and surface fraying.  2. Subscapularis tendinosis and low-grade partial tear.  3. Intra-articular biceps tendinosis, possible intrasubstance partial tear near the biceps-labral complex.  4. Imaging findings which  may be seen with adhesive capsulitis.  5. Moderate AC joint arthrosis.   NEXT MD VISIT: November 2025  OBJECTIVE:  Note: Objective measures were completed at Evaluation unless otherwise noted.  COGNITION: Overall cognitive status: Within functional limits for tasks assessed     SENSATION: WFL   POSTURE: Rounded shoulders and forward head posture  PALPATION: Muscle tightness in bilateral upper traps, rotator cuff muscles on left side  joint hypomobility with A-P glides, and inferior glides to left shoulder Decreased scapular mobility on left side UPPER EXTREMITY ROM:  AROM Left  Right Left  8/20 post manual (seated) Lt  01/07/24  Flexion  128 WNL 134 130- reduced scapular elevation  Abduction* 90 WNL 95 105  IR Lacking 8 inches vs Rt WNL    ER Lacking 6 inches vs Rt WNL     PROM   Left: Flexion=116, Abduction*=80    (Blank rows = not tested)  UPPER EXTREMITY MMT:  MMT Right eval Left eval  Shoulder flexion   4 4  Shoulder Abd* 4 4-  ER 4 4-  IR 4 4-  Extension 4 4-                               (Blank rows = not tested)                                                                                                                    TREATMENT DATE:  01/07/24 Initiated prone shoulder ext, rows and horizontal abduction x 20 each with 3 lb left Side lying ER with 3 lbs x 20 left Supine serratus punch with 3 lbs x 20 left Arm bike: level 1.2x 8 min (4/4) Finger ladder: flexion and scaption x10 Trigger Point Dry Needling  Subsequent Treatment: Instructions provided previously at initial dry needling treatment.   Patient Verbal Consent Given: Yes Education Handout Provided: Previously Provided Muscles Treated: L UT, LS, infraspinatus, teres, lats, subscap and pecs, Lt rhomboids Electrical Stimulation Performed: No Treatment Response/Outcome: Utilized skilled palpation to identify bony landmarks and trigger points.  Able to illicit twitch response and  muscle elongation.  Soft tissue mobilization to muscles needled following DN to further promote tissue elongation and decreased pain.   Scapular mobs in all directions.      01/01/24 Visual check of AROM seated Initiated prone shoulder ext, rows and horizontal abduction x 20 each with 3 lb left Side lying ER with 3 lbs x 20 left Supine serratus punch with 3  lbs x 20 left Added side lying sleeper and cross body stretch lying on left side x 5 each holding 10 sec each Reviewed AAROM with dowel x 5-10 each vc's for correct technique  12/31/23 Flex/ABD wall slides x 10 ea Standing 3 way raises 2# x 10 ea some pain with ABD Sleeper stretch x 30 sec ea at wall and in SL   Trigger Point Dry Needling  Initial Treatment: Pt instructed on Dry Needling rational, procedures, and possible side effects. Pt instructed to expect mild to moderate muscle soreness later in the day and/or into the next day.  Pt instructed in methods to reduce muscle soreness. Pt instructed to continue prescribed HEP. Because Dry Needling was performed over or adjacent to a lung field, pt was educated on S/S of pneumothorax and to seek immediate medical attention should they occur.  Patient was educated on signs and symptoms of infection and other risk factors and advised to seek medical attention should they occur.  Patient verbalized understanding of these instructions and education.   Patient Verbal Consent Given: Yes Education Handout Provided: Yes Muscles Treated: L UT, LS, infraspinatus, teres, lats, subscap and pecs Electrical Stimulation Performed: No Treatment Response/Outcome: Utilized skilled palpation to identify bony landmarks and trigger points.  Able to illicit twitch response and muscle elongation.  Soft tissue mobilization to muscles needled to further promote tissue elongation and decreased pain.   \  Inferior, PA and AP mobs to left shoulder post DN with PROM into flexion and and IR.    PATIENT  EDUCATION:  Education details: Access Code: [email or web address] Person educated: Patient Education method: Explanation, Demonstration, and Handouts Education comprehension: verbalized understanding and returned demonstration  HOME EXERCISE PROGRAM:  Access Code: [email or web address] URL: [email or web address] Date: 12/31/2023 Prepared by: Mliss  Exercises - Seated Shoulder Flexion AAROM with Pulley Behind  - 3 x daily - 7 x weekly - 3 sets - 10 reps - Seated Shoulder Scaption AAROM with Pulley at Side  - 3 x daily - 7 x weekly - 3 sets - 10 reps - Seated Shoulder Internal Rotation AAROM with Pulley  - 1 x daily - 7 x weekly - 3 sets - 10 reps - Shoulder Flexion Wall Slide with Towel  - 2 x daily - 7 x weekly - 1 sets - 10 reps - 10 hold - Standing Shoulder Abduction Slides at Wall  - 1 x daily - 7 x weekly - 1 sets - 10 reps - 10 hold - Supine Shoulder External Rotation with Dowel  - 2 x daily - 7 x weekly - 2 sets - 10 reps - Standing Sleeper Stretch at [employer]  - 1 x daily - 3 x weekly - 1 sets - 3 reps - 30 hold - Sleeper Stretch  - 2 x daily - 7 x weekly - 1 sets - 3 reps - 30-60 sec hold     ASSESSMENT:  CLINICAL IMPRESSION: Pt with increased ease of movement with Lt shoulder flexion and abduction with less scapular elevation.  He is consistent with regular flexibility at home.  PT provided verbal and tactile cues throughout session.  Good response to dry needling with improved tissue mobility and twitch response.  Improved scapular mobility overall.  Patient will benefit from skilled PT to address the below impairments and improve overall function.     OBJECTIVE IMPAIRMENTS: Muscle spasm, decreased shoulder mobility, decreased shoulder strength, postural dysfunction, left shoulder pain, decreased functional capacity, pain with reaching behind back,   ACTIVITY  LIMITATIONS: Reaching over head, abducting left shoulder beyond 90 deg, reaching behind back, sleeping  PARTICIPATION  LIMITATIONS: cleaning, driving, community activity, occupation, and yard work  PERSONAL FACTORS: 1-2 comorbidities: Lt TKA, lung cancer  are also affecting patient's functional outcome.   REHAB POTENTIAL: Good  CLINICAL DECISION MAKING: Stable/uncomplicated  EVALUATION COMPLEXITY: Low   GOALS: Goals reviewed with patient? Yes  SHORT TERM GOALS: Target date: 01/23/2024   Be independent in initial HEP Baseline: Goal status: MET  2.  Demonstrate Lt shouler A/ROM flexion to > or = to 140 degrees to allow for reaching overhead to paint and put dishes away in cabinets.  Baseline: 128 Goal status: INITIAL  3.  Improve Lt shoulder A/ROM IR to lacking < or = to 5 inches vs the Rt to improve self-care Baseline: 8 difference Goal status: INITIAL  4.   Improve strength 4+/5 to lift and carry objects overhead pain free.  Baseline: 4- Goal status: INITIAL    LONG TERM GOALS: Target date: 02/18/2024     Be independent in advanced HEP Baseline:  Goal status: INITIAL  2.  Improve Lt shoulder A/ROM ER to lacking < or = to 3 inches vs the Rt to improve self-care Baseline:  Goal status: INITIAL  3.  Demonstrate Lt Shouler A/ROM flexion to > or = to 150 degrees to allow reaching overhead to paint without compensation  Baseline: 128 Goal status: INITIAL  4.  Demonstrate symmetry with bilateral AROM Abduction without compensation due to increased strength and ROM Baseline:  Goal status: INITIAL  5.  Improve strength in left shoulder to complete house work such as fixing overhead lights, paint, etc with > or = to 50% less Lt UE pain Baseline: 4-  and 3/10 pain Goal status: INITIAL  6.  Reach behind back WNL to be able to reach for seat behind while driving, reach for wallet in back pocket.  Baseline: lacking 8 inches Goal status: INITIAL   PLAN:  PT FREQUENCY: 2x/week  PT DURATION: 8 weeks  PLANNED INTERVENTIONS: 97110-Therapeutic exercises, 97530- Therapeutic activity,  97112- Neuromuscular re-education, 97535- Self Care, 02859- Manual therapy, [phone]- Gait training, [phone]- Aquatic Therapy, 97014- Electrical stimulation (unattended), [phone]- Electrical stimulation (manual), 97016- Vasopneumatic device, 97035- Ultrasound, Patient/Family education, Balance training, Taping, Joint mobilization, Joint manipulation, Scar mobilization, Vestibular training, and Cryotherapy  PLAN FOR NEXT SESSION: Assess DN, Lt shoulder ROM, strength, manual for ROM gains, dry needling to Lt scapular and shoulder musculature, postural strength, measure Lt shoulder IR  Burnard Joy, PT 01/07/24 10:26 AM   [facility] [address] [place], [place] [address] Phone # [phone] Fax [phone]

## 2024-01-08 NOTE — Therapy (Signed)
 OUTPATIENT PHYSICAL THERAPY UPPER EXTREMITY TREATMENT    Patient Name: Jesse Stewart MRN: [id number] DOB:09-Apr-1966, 58 y.o., male Today's Date: 01/09/2024  END OF SESSION:  PT End of Session - 01/09/24 0802     Visit Number 5    Number of Visits 15    Date for PT Re-Evaluation 02/18/24    Authorization Type [facility]- 15 visits approved    Authorization - Visit Number 5    Authorization - Number of Visits 15    PT Start Time 0802    PT Stop Time 0842    PT Time Calculation (min) 40 min              Past Medical History:  Diagnosis Date   Arthritis    Hypertension    Lung cancer (HCC) 03/13/2022   Past Surgical History:  Procedure Laterality Date   KNEE ARTHROSCOPY W/ MENISCAL REPAIR     LUNG REMOVAL, PARTIAL Right 03/27/2022   TOTAL KNEE ARTHROPLASTY Left 04/29/2023   Patient Active Problem List   Diagnosis Date Noted   Colon polyps 05/16/2019   Obesity (BMI 30.0-34.9) 05/13/2019   Chest tightness 10/06/2014   COVID 10/06/2014   Hematochezia 12/07/2012   Hernia of abdominal wall 12/07/2012   Well adult exam 04/23/2012   Neoplasm of uncertain behavior of skin 05/04/2010   ACTINIC KERATOSIS 05/04/2010   Dyslipidemia 04/04/2008   GERD 04/04/2008   Osteoarthritis 04/04/2008   KNEE PAIN 04/04/2008   ABNORMAL GLUCOSE NEC 04/04/2008   ABNORMAL LIVER FUNCTION TESTS 04/04/2008   Essential hypertension 12/29/2006    PCP: Garald Karlynn GAILS, MD   REFERRING PROVIDER: Lanell Royals, PA  REFERRING DIAG: Left Adhesive capsulitis  THERAPY DIAG:  Pain in Left shoulder  Rationale for Evaluation and Treatment: Rehabilitation  ONSET DATE: 1 month ago  SUBJECTIVE:   SUBJECTIVE STATEMENT: Last night I was reaching into the dryer and that's one of the most painful thing. It's a sharp pain going all the way down.   Eval: Pt states he received PT for his left shoulder earlier this year, that improved his mobility and strength. However, recently within  past month pt is experiencing a flare up of pain along with significantly decreased left shoulder ROM. Pt returned back to his PCP for a check up. PCP recommended he return back to PT in hopes pt does not require surgery. Pt stated he does not want surgery. He states he has difficulty and pain reaching left arm behind his back and has night pain that wakes him up. He states he sleeps on his left side.    PERTINENT HISTORY: Lt TKA 04/29/23, Lung cancer with lung resection 03/2022 PAIN: 01/07/24:  Are you having pain? Yes: NPRS scale: 0-7/10 Pain location: Anterior shoulder, shoots down  Pain description: sharp, shooting  Aggravating factors: trying to stretch out, overhead like painting  Relieving factors: ibuprofen , aloxican hasn't taken, cream doc gave for post op knee   PRECAUTIONS: Other: history of lung cancer   RED FLAGS: None   WEIGHT BEARING RESTRICTIONS: No  FALLS:  Has patient fallen in last 6 months? 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   PLOF: Independent, Vocation/Vocational requirements: see above, and Leisure: hike,  walk, beach   PATIENT GOALS: reach all the way over and get rid of the pain as much as possible  Diagnostic: MRI on 06/27/23:  IMPRESSION:  1. Articular-sided partial tear of the supraspinatus tendon approaching 50% thickness, measuring 2 cm anterior-posterior. Background of tendinosis and surface fraying.  2. Subscapularis tendinosis and low-grade partial tear.  3. Intra-articular biceps tendinosis, possible intrasubstance partial tear near the biceps-labral complex.  4. Imaging findings which may be seen with adhesive capsulitis.  5.  Moderate AC joint arthrosis.   NEXT MD VISIT: November 2025  OBJECTIVE:  Note: Objective measures were completed at Evaluation unless otherwise noted.  COGNITION: Overall cognitive status: Within functional limits for tasks assessed     SENSATION: WFL   POSTURE: Rounded shoulders and forward head posture  PALPATION: Muscle tightness in bilateral upper traps, rotator cuff muscles on left side  joint hypomobility with A-P glides, and inferior glides to left shoulder Decreased scapular mobility on left side UPPER EXTREMITY ROM:  AROM Left  Right Left  8/20 post manual (seated) Lt  01/07/24 Lt  Flexion  128 WNL 134 130- reduced scapular elevation 135 sitting 146 supine  Abduction* 90 WNL 95 105   IR Lacking 8 inches vs Rt WNL   Lacking 6 inch vs Rt (post stretches) about L5  ER Lacking 6 inches vs Rt WNL      PROM   Left: Flexion=116, Abduction*=80    (Blank rows = not tested)  UPPER EXTREMITY MMT:  MMT Right eval Left eval  Shoulder flexion   4 4  Shoulder Abd* 4 4-  ER 4 4-  IR 4 4-  Extension 4 4-                               (Blank rows = not tested)                                                                                                                    TREATMENT DATE:  01/09/24  Arm bike: level 1.2x 8 min (4/4) Lats tested for tightness  OH flexion stretch with dowel in 90/90 x 10 SL bent arm horizontal Adduction passive stretch with scapula stabilized by PT (down toward table) Open books x 10 B (bent arm when on R side), then another 10 lying on R side Standing IR with dowel behind back horizontally x 10 cues for upright posture  prone shoulder ext, rows and horizontal abduction x 20 each with 3 lb left Side lying ER, flex and horizontal ABD with 3 lbs 2 x 10 left    01/07/24 Initiated prone shoulder ext, rows and horizontal abduction x 20 each with 3 lb left Side lying ER with 3 lbs x 20 left Supine serratus punch with 3 lbs x 20  left Arm bike: level 1.2x 8 min (4/4) Finger ladder: flexion and scaption x10 Trigger Point Dry Needling  Subsequent Treatment: Instructions provided previously at initial  dry needling treatment.   Patient Verbal Consent Given: Yes Education Handout Provided: Previously Provided Muscles Treated: L UT, LS, infraspinatus, teres, lats, subscap and pecs, Lt rhomboids Electrical Stimulation Performed: No Treatment Response/Outcome: Utilized skilled palpation to identify bony landmarks and trigger points.  Able to illicit twitch response and muscle elongation.  Soft tissue mobilization to muscles needled following DN to further promote tissue elongation and decreased pain.   Scapular mobs in all directions.      01/01/24 Visual check of AROM seated Initiated prone shoulder ext, rows and horizontal abduction x 20 each with 3 lb left Side lying ER with 3 lbs x 20 left Supine serratus punch with 3 lbs x 20 left Added side lying sleeper and cross body stretch lying on left side x 5 each holding 10 sec each Reviewed AAROM with dowel x 5-10 each vc's for correct technique  12/31/23 Flex/ABD wall slides x 10 ea Standing 3 way raises 2# x 10 ea some pain with ABD Sleeper stretch x 30 sec ea at wall and in SL   Trigger Point Dry Needling  Initial Treatment: Pt instructed on Dry Needling rational, procedures, and possible side effects. Pt instructed to expect mild to moderate muscle soreness later in the day and/or into the next day.  Pt instructed in methods to reduce muscle soreness. Pt instructed to continue prescribed HEP. Because Dry Needling was performed over or adjacent to a lung field, pt was educated on S/S of pneumothorax and to seek immediate medical attention should they occur.  Patient was educated on signs and symptoms of infection and other risk factors and advised to seek medical attention should they occur.  Patient verbalized understanding of these instructions and education.    Patient Verbal Consent Given: Yes Education Handout Provided: Yes Muscles Treated: L UT, LS, infraspinatus, teres, lats, subscap and pecs Electrical Stimulation Performed: No Treatment Response/Outcome: Utilized skilled palpation to identify bony landmarks and trigger points.  Able to illicit twitch response and muscle elongation.  Soft tissue mobilization to muscles needled to further promote tissue elongation and decreased pain.   \  Inferior, PA and AP mobs to left shoulder post DN with PROM into flexion and and IR.    PATIENT EDUCATION:  Education details: Access Code: [email or web address] Person educated: Patient Education method: Explanation, Demonstration, and Handouts Education comprehension: verbalized understanding and returned demonstration  HOME EXERCISE PROGRAM:  Access Code: [email or web address] URL: [email or web address] Date: 01/09/2024 Prepared by: Mliss  Exercises - Seated Shoulder Flexion AAROM with Pulley Behind  - 3 x daily - 7 x weekly - 3 sets - 10 reps - Seated Shoulder Scaption AAROM with Pulley at Side  - 3 x daily - 7 x weekly - 3 sets - 10 reps - Seated Shoulder Internal Rotation AAROM with Pulley  - 1 x daily - 7 x weekly - 3 sets - 10 reps - Shoulder Flexion Wall Slide with Towel  - 2 x daily - 7 x weekly - 1 sets - 10 reps - 10 hold - Standing Shoulder Abduction Slides at Wall  - 1 x daily - 7 x weekly - 1 sets - 10 reps - 10 hold - Supine Shoulder External Rotation with Dowel  - 2 x daily - 7 x weekly - 2 sets - 10 reps - Standing Sleeper Stretch at [employer]  - 1 x daily - 3 x weekly - 1 sets - 3 reps - 30 hold - Sleeper Stretch  - 2 x daily - 7 x  weekly - 1 sets - 3 reps - 30-60 sec hold - Sidelying Posterior Cuff Cross Body Stretch - 1/4 Turn Back  - 1 x daily - 7 x weekly - 1 sets - 5 reps - 10 sec hold - Prone Shoulder Extension - Single Arm  - 2 x daily - 7 x weekly - 2 sets - 10 reps - Prone Shoulder Row  - 2 x daily - 7 x weekly - 2 sets - 10 reps -  Prone Single Arm Shoulder Horizontal Abduction with Scapular Retraction and Palm Down  - 2 x daily - 7 x weekly - 2 sets - 10 reps - Sidelying Shoulder External Rotation  - 2 x daily - 7 x weekly - 2 sets - 10 reps - Single Arm Serratus Punches in Supine with Dumbbell  - 2 x daily - 7 x weekly - 2 sets - 10 reps - Standing Bilateral Shoulder Internal Rotation AAROM with Dowel  - 2 x daily - 7 x weekly - 2 sets - 10 reps - Sidelying Open Book  - 2 x daily - 7 x weekly - 1 sets - 10 reps - 5 sec hold     ASSESSMENT:  CLINICAL IMPRESSION: Patient responded well to lat and post shoulder stretches in SL with increased IR immediately after. Progressed HEP with open books as patient demonstrated marked thoracic immobility initially. This improved with second set. Also added IR with dowel as patient felt good ant stretch with this. Functional IR still limited by about 6 inches compared to R shoulder.    OBJECTIVE IMPAIRMENTS: Muscle spasm, decreased shoulder mobility, decreased shoulder strength, postural dysfunction, left shoulder pain, decreased functional capacity, pain with reaching behind back,   ACTIVITY LIMITATIONS: Reaching over head, abducting left shoulder beyond 90 deg, reaching behind back, sleeping  PARTICIPATION LIMITATIONS: cleaning, driving, community activity, occupation, and yard work  PERSONAL FACTORS: 1-2 comorbidities: Lt TKA, lung cancer  are also affecting patient's functional outcome.   REHAB POTENTIAL: Good  CLINICAL DECISION MAKING: Stable/uncomplicated  EVALUATION COMPLEXITY: Low   GOALS: Goals reviewed with patient? Yes  SHORT TERM GOALS: Target date: 01/23/2024   Be independent in initial HEP Baseline: Goal status: MET  2.  Demonstrate Lt shouler A/ROM flexion to > or = to 140 degrees to allow for reaching overhead to paint and put dishes away in cabinets.  Baseline: 128 Goal status: INITIAL  3.  Improve Lt shoulder A/ROM IR to lacking < or = to 5  inches vs the Rt to improve self-care Baseline: 8 difference Goal status: INITIAL  4.   Improve strength 4+/5 to lift and carry objects overhead pain free.  Baseline: 4- Goal status: INITIAL    LONG TERM GOALS: Target date: 02/18/2024     Be independent in advanced HEP Baseline:  Goal status: INITIAL  2.  Improve Lt shoulder A/ROM ER to lacking < or = to 3 inches vs the Rt to improve self-care Baseline:  Goal status: INITIAL  3.  Demonstrate Lt Shouler A/ROM flexion to > or = to 150 degrees to allow reaching overhead to paint without compensation  Baseline: 128 Goal status: INITIAL  4.  Demonstrate symmetry with bilateral AROM Abduction without compensation due to increased strength and ROM Baseline:  Goal status: INITIAL  5.  Improve strength in left shoulder to complete house work such as fixing overhead lights, paint, etc with > or = to 50% less Lt UE pain Baseline: 4-  and 3/10 pain Goal status:  INITIAL  6.  Reach behind back WNL to be able to reach for seat behind while driving, reach for wallet in back pocket.  Baseline: lacking 8 inches Goal status: INITIAL   PLAN:  PT FREQUENCY: 2x/week  PT DURATION: 8 weeks  PLANNED INTERVENTIONS: 97110-Therapeutic exercises, 97530- Therapeutic activity, 97112- Neuromuscular re-education, 97535- Self Care, [id number]- Manual therapy, [phone]- Gait training, [phone]- Aquatic Therapy, 97014- Electrical stimulation (unattended), [phone]- Electrical stimulation (manual), 97016- Vasopneumatic device, 97035- Ultrasound, Patient/Family education, Balance training, Taping, Joint mobilization, Joint manipulation, Scar mobilization, Vestibular training, and Cryotherapy  PLAN FOR NEXT SESSION: Assess DN, Lt shoulder ROM, strength, manual for ROM gains, dry needling to Lt scapular and shoulder musculature, postural strength, measure Lt shoulder IR  Mliss Cummins, PT  01/09/24 8:45 AM   [facility] [address]  [address] [place], [place] [address] Phone # [phone] Fax [phone]

## 2024-01-09 ENCOUNTER — Ambulatory Visit: Admitting: Physical Therapy

## 2024-01-09 ENCOUNTER — Encounter: Payer: Self-pay | Admitting: Physical Therapy

## 2024-01-09 DIAGNOSIS — M6281 Muscle weakness (generalized): Secondary | ICD-10-CM

## 2024-01-09 DIAGNOSIS — M7502 Adhesive capsulitis of left shoulder: Secondary | ICD-10-CM | POA: Diagnosis not present

## 2024-01-09 DIAGNOSIS — G8929 Other chronic pain: Secondary | ICD-10-CM

## 2024-01-09 DIAGNOSIS — M25612 Stiffness of left shoulder, not elsewhere classified: Secondary | ICD-10-CM

## 2024-01-09 DIAGNOSIS — R252 Cramp and spasm: Secondary | ICD-10-CM

## 2024-01-14 ENCOUNTER — Ambulatory Visit: Attending: Internal Medicine

## 2024-01-14 DIAGNOSIS — M6281 Muscle weakness (generalized): Secondary | ICD-10-CM | POA: Diagnosis present

## 2024-01-14 DIAGNOSIS — G8929 Other chronic pain: Secondary | ICD-10-CM | POA: Insufficient documentation

## 2024-01-14 DIAGNOSIS — M25612 Stiffness of left shoulder, not elsewhere classified: Secondary | ICD-10-CM | POA: Diagnosis present

## 2024-01-14 DIAGNOSIS — R293 Abnormal posture: Secondary | ICD-10-CM | POA: Diagnosis present

## 2024-01-14 DIAGNOSIS — R252 Cramp and spasm: Secondary | ICD-10-CM | POA: Diagnosis present

## 2024-01-14 DIAGNOSIS — M25512 Pain in left shoulder: Secondary | ICD-10-CM | POA: Insufficient documentation

## 2024-01-14 NOTE — Therapy (Signed)
 OUTPATIENT PHYSICAL THERAPY UPPER EXTREMITY TREATMENT    Patient Name: Jesse Stewart MRN: [id number] DOB:12-06-1965, 58 y.o., male Today's Date: 01/14/2024  END OF SESSION:  PT End of Session - 01/14/24 0941     Visit Number 6    Date for PT Re-Evaluation 02/18/24    Authorization Type [facility]- 15 visits approved    Authorization - Visit Number 6    Authorization - Number of Visits 15    PT Start Time 0847    PT Stop Time 0929    PT Time Calculation (min) 42 min    Activity Tolerance Patient tolerated treatment well    Behavior During Therapy [facility] for tasks assessed/performed               Past Medical History:  Diagnosis Date   Arthritis    Hypertension    Lung cancer (HCC) 03/13/2022   Past Surgical History:  Procedure Laterality Date   KNEE ARTHROSCOPY W/ MENISCAL REPAIR     LUNG REMOVAL, PARTIAL Right 03/27/2022   TOTAL KNEE ARTHROPLASTY Left 04/29/2023   Patient Active Problem List   Diagnosis Date Noted   Colon polyps 05/16/2019   Obesity (BMI 30.0-34.9) 05/13/2019   Chest tightness 10/06/2014   COVID 10/06/2014   Hematochezia 12/07/2012   Hernia of abdominal wall 12/07/2012   Well adult exam 04/23/2012   Neoplasm of uncertain behavior of skin 05/04/2010   ACTINIC KERATOSIS 05/04/2010   Dyslipidemia 04/04/2008   GERD 04/04/2008   Osteoarthritis 04/04/2008   KNEE PAIN 04/04/2008   ABNORMAL GLUCOSE NEC 04/04/2008   ABNORMAL LIVER FUNCTION TESTS 04/04/2008   Essential hypertension 12/29/2006    PCP: Garald Karlynn GAILS, MD   REFERRING PROVIDER: Lanell Royals, PA  REFERRING DIAG: Left Adhesive capsulitis  THERAPY DIAG:  Pain in Left shoulder  Rationale for Evaluation and Treatment: Rehabilitation  ONSET DATE: 1 month ago  SUBJECTIVE:   SUBJECTIVE STATEMENT: Last night I was reaching into the dryer and that's one of the most painful thing. It's a sharp pain going all the way down.   Eval: Pt states he received PT for his left  shoulder earlier this year, that improved his mobility and strength. However, recently within past month pt is experiencing a flare up of pain along with significantly decreased left shoulder ROM. Pt returned back to his PCP for a check up. PCP recommended he return back to PT in hopes pt does not require surgery. Pt stated he does not want surgery. He states he has difficulty and pain reaching left arm behind his back and has night pain that wakes him up. He states he sleeps on his left side.    PERTINENT HISTORY: Lt TKA 04/29/23, Lung cancer with lung resection 03/2022 PAIN: 01/07/24:  Are you having pain? Yes: NPRS scale: 0-7/10 Pain location: Anterior shoulder, shoots down  Pain description: sharp, shooting  Aggravating factors: trying to stretch out, overhead like painting  Relieving factors: ibuprofen , aloxican hasn't taken, cream doc gave for post op knee   PRECAUTIONS: Other: history of lung cancer   RED FLAGS: None   WEIGHT BEARING RESTRICTIONS: No  FALLS:  Has patient fallen in last 6 months? 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   PLOF: Independent, Vocation/Vocational requirements: see above, and Leisure: hike, walk, beach   PATIENT GOALS: reach all the way over and get rid of the pain as much as possible  Diagnostic: MRI on 06/27/23:  IMPRESSION:  1. Articular-sided partial tear of the supraspinatus tendon approaching 50% thickness, measuring 2 cm anterior-posterior. Background of tendinosis and surface fraying.  2. Subscapularis tendinosis and low-grade partial tear.  3. Intra-articular biceps tendinosis, possible intrasubstance partial tear near the  biceps-labral complex.  4. Imaging findings which may be seen with adhesive capsulitis.  5. Moderate AC joint arthrosis.   NEXT MD VISIT: November 2025  OBJECTIVE:  Note: Objective measures were completed at Evaluation unless otherwise noted.  COGNITION: Overall cognitive status: Within functional limits for tasks assessed     SENSATION: WFL   POSTURE: Rounded shoulders and forward head posture  PALPATION: Muscle tightness in bilateral upper traps, rotator cuff muscles on left side  joint hypomobility with A-P glides, and inferior glides to left shoulder Decreased scapular mobility on left side UPPER EXTREMITY ROM:  AROM Left  Right Left  8/20 post manual (seated) Lt  01/07/24 Lt  Flexion  128 WNL 134 130- reduced scapular elevation 135 sitting 146 supine  Abduction* 90 WNL 95 105   IR Lacking 8 inches vs Rt WNL   Lacking 6 inch vs Rt (post stretches) about L5  ER Lacking 6 inches vs Rt WNL      PROM   Left: Flexion=116, Abduction*=80    (Blank rows = not tested)  UPPER EXTREMITY MMT:  MMT Right eval Left eval  Shoulder flexion   4 4  Shoulder Abd* 4 4-  ER 4 4-  IR 4 4-  Extension 4 4-                               (Blank rows = not tested)                                                                                                                    TREATMENT DATE:  01/14/24 Arm bike: level 1.5x 8 min (4/4)-PT present to discuss progress  Wall ladder: flexion and scaption x10  OH flexion stretch with dowel in 90/90 x 10 Open books x 10 B (bent arm when on R side), then another 10 lying on R side Standing IR with dowel behind back horizontally x 10 cues for upright posture Standing flexion and scaption with 2# weight- tactile cues for scapular depression 2x10 on Lt Side lying ER, flex and horizontal ABD with 3 lbs 2 x 10 left Trigger Point Dry Needling  Subsequent Treatment: Instructions provided previously at initial dry needling treatment.    Patient Verbal Consent Given: Yes Education Handout Provided: Previously Provided Muscles Treated: L UT, LS, infraspinatus, teres, lats, subscap and pecs, Lt rhomboids Electrical Stimulation Performed: No Treatment Response/Outcome: Utilized skilled palpation to identify bony landmarks and trigger points.  Able  to illicit twitch response and muscle elongation.  Soft tissue mobilization to muscles needled following DN to further promote tissue elongation and decreased pain.   Scapular mobs in all directions.   01/09/24 Arm bike: level 1.2x 8 min (4/4) Lats tested for tightness  OH flexion stretch with dowel in 90/90 x 10 SL bent arm horizontal Adduction passive stretch with scapula stabilized by PT (down toward table) Open books x 10 B (bent arm when on R side), then another 10 lying on R side Standing IR with dowel behind back horizontally x 10 cues for upright posture  prone shoulder ext, rows and horizontal abduction x 20 each with 3 lb left Side lying ER, flex and horizontal ABD with 3 lbs 2 x 10 left    01/07/24 Initiated prone shoulder ext, rows and horizontal abduction x 20 each with 3 lb left Side lying ER with 3 lbs x 20 left Supine serratus punch with 3 lbs x 20 left Arm bike: level 1.2x 8 min (4/4) Finger ladder: flexion and scaption x10 Trigger Point Dry Needling  Subsequent Treatment: Instructions provided previously at initial dry needling treatment.   Patient Verbal Consent Given: Yes Education Handout Provided: Previously Provided Muscles Treated: L UT, LS, infraspinatus, teres, lats, subscap and pecs, Lt rhomboids Electrical Stimulation Performed: No Treatment Response/Outcome: Utilized skilled palpation to identify bony landmarks and trigger points.  Able to illicit twitch response and muscle elongation.  Soft tissue mobilization to muscles needled following DN to further promote tissue elongation and decreased pain.   Scapular mobs in all directions.      PATIENT EDUCATION:  Education details: Access Code: [email or web address] Person educated: Patient Education method: Explanation, Demonstration, and Handouts Education comprehension: verbalized understanding and returned demonstration  HOME EXERCISE PROGRAM:  Access Code: [email or web address] URL: [email or web address] Date: 01/09/2024 Prepared by: Mliss  Exercises - Seated Shoulder Flexion AAROM with Pulley Behind  - 3 x daily - 7 x weekly - 3 sets - 10 reps - Seated Shoulder Scaption AAROM with Pulley at Side  - 3 x daily - 7 x weekly - 3 sets - 10 reps - Seated Shoulder Internal Rotation AAROM with Pulley  - 1 x daily - 7 x weekly - 3 sets - 10 reps - Shoulder Flexion Wall Slide with Towel  - 2 x daily - 7 x weekly - 1 sets - 10 reps - 10 hold - Standing Shoulder Abduction Slides at Wall  - 1 x daily - 7 x weekly - 1 sets - 10 reps - 10 hold - Supine Shoulder External Rotation with Dowel  - 2 x daily - 7 x weekly - 2 sets - 10 reps - Standing Sleeper Stretch at [employer]  - 1 x daily - 3 x weekly - 1 sets - 3 reps - 30 hold - Sleeper Stretch  - 2 x daily - 7 x weekly - 1 sets - 3 reps - 30-60 sec hold - Sidelying Posterior Cuff Cross Body Stretch - 1/4 Turn Back  - 1 x daily - 7 x weekly - 1 sets - 5 reps - 10 sec hold - Prone Shoulder Extension - Single Arm  - 2 x daily - 7 x weekly - 2 sets - 10 reps - Prone Shoulder Row  - 2 x daily - 7 x weekly - 2 sets - 10 reps - Prone Single Arm Shoulder Horizontal Abduction with Scapular Retraction and Palm Down  - 2 x daily - 7 x weekly - 2 sets -  10 reps - Sidelying Shoulder External Rotation  - 2 x daily - 7 x weekly - 2 sets - 10 reps - Single Arm Serratus Punches in Supine with Dumbbell  - 2 x daily - 7 x weekly - 2 sets - 10 reps - Standing Bilateral Shoulder Internal Rotation AAROM with Dowel  - 2 x daily - 7 x weekly - 2 sets - 10 reps - Sidelying Open Book  - 2 x daily - 7 x weekly - 1 sets - 10 reps - 5 sec  hold     ASSESSMENT:  CLINICAL IMPRESSION: Pt with overall improved functional mobility of the Lt UE with reduced scapular substitution.  Pt is stretching regularly at home.  Session focused on flexibility and functional strength in addition to dry needling for tissue mobility.  PT monitored throughout session for posture, alignment and pain.  Good response to dry needling with improved tissue mobility and twitch response. Patient will benefit from skilled PT to address the below impairments and improve overall function.    OBJECTIVE IMPAIRMENTS: Muscle spasm, decreased shoulder mobility, decreased shoulder strength, postural dysfunction, left shoulder pain, decreased functional capacity, pain with reaching behind back,   ACTIVITY LIMITATIONS: Reaching over head, abducting left shoulder beyond 90 deg, reaching behind back, sleeping  PARTICIPATION LIMITATIONS: cleaning, driving, community activity, occupation, and yard work  PERSONAL FACTORS: 1-2 comorbidities: Lt TKA, lung cancer  are also affecting patient's functional outcome.   REHAB POTENTIAL: Good  CLINICAL DECISION MAKING: Stable/uncomplicated  EVALUATION COMPLEXITY: Low   GOALS: Goals reviewed with patient? Yes  SHORT TERM GOALS: Target date: 01/23/2024   Be independent in initial HEP Baseline: Goal status: MET  2.  Demonstrate Lt shouler A/ROM flexion to > or = to 140 degrees to allow for reaching overhead to paint and put dishes away in cabinets.  Baseline: 128 Goal status: INITIAL  3.  Improve Lt shoulder A/ROM IR to lacking < or = to 5 inches vs the Rt to improve self-care Baseline: 6 difference (01/14/24) Goal status: In progress   4.   Improve strength 4+/5 to lift and carry objects overhead pain free.  Baseline: 4- Goal status: INITIAL    LONG TERM GOALS: Target date: 02/18/2024     Be independent in advanced HEP Baseline:  Goal status: In progress   2.  Improve Lt shoulder A/ROM ER to lacking < or  = to 3 inches vs the Rt to improve self-care Baseline:  Goal status: INITIAL  3.  Demonstrate Lt Shouler A/ROM flexion to > or = to 150 degrees to allow reaching overhead to paint without compensation  Baseline: 128 Goal status: INITIAL  4.  Demonstrate symmetry with bilateral AROM Abduction without compensation due to increased strength and ROM Baseline:  Goal status: INITIAL  5.  Improve strength in left shoulder to complete house work such as fixing overhead lights, paint, etc with > or = to 50% less Lt UE pain Baseline: 4-  and 3/10 pain Goal status: INITIAL  6.  Reach behind back WNL to be able to reach for seat behind while driving, reach for wallet in back pocket.  Baseline: lacking 8 inches Goal status: INITIAL   PLAN:  PT FREQUENCY: 2x/week  PT DURATION: 8 weeks  PLANNED INTERVENTIONS: 97110-Therapeutic exercises, 97530- Therapeutic activity, [id number]- Neuromuscular re-education, 97535- Self Care, [id number]- Manual therapy, [id number]- Gait training, [phone]- Aquatic Therapy, [id number]- Electrical stimulation (unattended), [id number]- Electrical stimulation (manual), [id number]- Vasopneumatic device, [id number]- Ultrasound, Patient/Family education, Balance training, Taping, Joint  mobilization, Joint manipulation, Scar mobilization, Vestibular training, and Cryotherapy  PLAN FOR NEXT SESSION: Assess dry needling and continue weekly as needed, Lt shoulder ROM, strength, manual for ROM gains, dry needling to Lt scapular and shoulder musculature, postural strength, measure ROM  Burnard Joy, PT 01/14/24 9:46 AM   [facility] [address] [place], [place] [address] Phone # [phone] Fax [phone]

## 2024-01-15 ENCOUNTER — Ambulatory Visit

## 2024-01-15 DIAGNOSIS — R252 Cramp and spasm: Secondary | ICD-10-CM

## 2024-01-15 DIAGNOSIS — M25512 Pain in left shoulder: Secondary | ICD-10-CM | POA: Diagnosis not present

## 2024-01-15 DIAGNOSIS — R293 Abnormal posture: Secondary | ICD-10-CM

## 2024-01-15 DIAGNOSIS — M6281 Muscle weakness (generalized): Secondary | ICD-10-CM

## 2024-01-15 DIAGNOSIS — G8929 Other chronic pain: Secondary | ICD-10-CM

## 2024-01-15 DIAGNOSIS — M25612 Stiffness of left shoulder, not elsewhere classified: Secondary | ICD-10-CM

## 2024-01-15 NOTE — Therapy (Signed)
 OUTPATIENT PHYSICAL THERAPY UPPER EXTREMITY TREATMENT    Patient Name: Jesse Stewart MRN: [id number] DOB:May 25, 1965, 58 y.o., male Today's Date: 01/15/2024  END OF SESSION:  PT End of Session - 01/15/24 1014     Visit Number 7    Date for PT Re-Evaluation 02/18/24    Authorization Type [facility]- 15 visits approved    Authorization - Visit Number 7    Authorization - Number of Visits 15    PT Start Time [phone]    PT Stop Time 1011    PT Time Calculation (min) 44 min    Activity Tolerance Patient tolerated treatment well    Behavior During Therapy [facility] for tasks assessed/performed                Past Medical History:  Diagnosis Date   Arthritis    Hypertension    Lung cancer (HCC) 03/13/2022   Past Surgical History:  Procedure Laterality Date   KNEE ARTHROSCOPY W/ MENISCAL REPAIR     LUNG REMOVAL, PARTIAL Right 03/27/2022   TOTAL KNEE ARTHROPLASTY Left 04/29/2023   Patient Active Problem List   Diagnosis Date Noted   Colon polyps 05/16/2019   Obesity (BMI 30.0-34.9) 05/13/2019   Chest tightness 10/06/2014   COVID 10/06/2014   Hematochezia 12/07/2012   Hernia of abdominal wall 12/07/2012   Well adult exam 04/23/2012   Neoplasm of uncertain behavior of skin 05/04/2010   ACTINIC KERATOSIS 05/04/2010   Dyslipidemia 04/04/2008   GERD 04/04/2008   Osteoarthritis 04/04/2008   KNEE PAIN 04/04/2008   ABNORMAL GLUCOSE NEC 04/04/2008   ABNORMAL LIVER FUNCTION TESTS 04/04/2008   Essential hypertension 12/29/2006    PCP: Garald Karlynn GAILS, MD   REFERRING PROVIDER: Lanell Royals, PA  REFERRING DIAG: Left Adhesive capsulitis  THERAPY DIAG:  Pain in Left shoulder  Rationale for Evaluation and Treatment: Rehabilitation  ONSET DATE: 1 month ago  SUBJECTIVE:   SUBJECTIVE STATEMENT: Shoulder is really feeling better.  I am able to move it more easily.   Eval: Pt states he received PT for his left shoulder earlier this year, that improved his  mobility and strength. However, recently within past month pt is experiencing a flare up of pain along with significantly decreased left shoulder ROM. Pt returned back to his PCP for a check up. PCP recommended he return back to PT in hopes pt does not require surgery. Pt stated he does not want surgery. He states he has difficulty and pain reaching left arm behind his back and has night pain that wakes him up. He states he sleeps on his left side.    PERTINENT HISTORY: Lt TKA 04/29/23, Lung cancer with lung resection 03/2022 PAIN: 01/15/24:  Are you having pain? Yes: NPRS scale: 0-6/10 Pain location: Anterior shoulder, shoots down  Pain description: sharp, shooting  Aggravating factors: sleep at night, stretching arm Relieving factors: ibuprofen , aloxican hasn't taken, cream doc gave for post op knee   PRECAUTIONS: Other: history of lung cancer   RED FLAGS: None   WEIGHT BEARING RESTRICTIONS: No  FALLS:  Has patient fallen in last 6 months? 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   PLOF: Independent, Vocation/Vocational requirements: see above, and Leisure: hike,  walk, beach   PATIENT GOALS: reach all the way over and get rid of the pain as much as possible  Diagnostic: MRI on 06/27/23:  IMPRESSION:  1. Articular-sided partial tear of the supraspinatus tendon approaching 50% thickness, measuring 2 cm anterior-posterior. Background of tendinosis and surface fraying.  2. Subscapularis tendinosis and low-grade partial tear.  3. Intra-articular biceps tendinosis, possible intrasubstance partial tear near the biceps-labral complex.  4. Imaging findings which may be seen with  adhesive capsulitis.  5. Moderate AC joint arthrosis.   NEXT MD VISIT: November 2025  OBJECTIVE:  Note: Objective measures were completed at Evaluation unless otherwise noted.  COGNITION: Overall cognitive status: Within functional limits for tasks assessed     SENSATION: WFL   POSTURE: Rounded shoulders and forward head posture  PALPATION: Muscle tightness in bilateral upper traps, rotator cuff muscles on left side  joint hypomobility with A-P glides, and inferior glides to left shoulder Decreased scapular mobility on left side UPPER EXTREMITY ROM:  AROM Left  Left  8/20 post manual (seated) Lt  01/07/24 Lt Lt  01/15/24  Flexion  128 134 130- reduced scapular elevation 135 sitting 146 supine Sitting 135  Abduction* 90 95 105    IR Lacking 8 inches vs Rt   Lacking 6 inch vs Rt (post stretches) about L5 Lacking 6 inches vs Rt   ER Lacking 6 inches vs Rt       PROM   Left: Flexion=116, Abduction*=80    (Blank rows = not tested)  UPPER EXTREMITY MMT:  MMT Right eval Left eval  Shoulder flexion   4 4  Shoulder Abd* 4 4-  ER 4 4-  IR 4 4-  Extension 4 4-                               (Blank rows = not tested)                                                                                                                    TREATMENT DATE:  01/15/24 Arm bike: level 2x 8 min (4/4)-PT present to discuss progress   OH flexion stretch with dowel in 90/90 x 10 SL bent arm horizontal Adduction passive stretch with scapula stabilized by PT (down toward table) Standing IR with dowel behind back horizontally x 10 cues for upright posture  prone shoulder ext, rows and horizontal abduction x 20 each with 3 lb left Side lying ER, flex and ABD with 3 lbs 2 x 10 left Manual: P/ROM and joint mobs to Lt shoulder in all directions   01/14/24 Arm bike: level 1.5x 8 min (4/4)-PT present to discuss progress  Wall ladder: flexion and scaption x10  OH flexion stretch with dowel in  90/90 x 10 Open books x 10 B (bent arm when on R side), then another 10 lying on R side Standing IR with dowel behind back horizontally x 10 cues for upright posture  Standing flexion and scaption with 2# weight- tactile cues for scapular depression 2x10 on Lt Side lying ER, flex and horizontal ABD with 3 lbs 2 x 10 left Trigger Point Dry Needling  Subsequent Treatment: Instructions provided previously at initial dry needling treatment.   Patient Verbal Consent Given: Yes Education Handout Provided: Previously Provided Muscles Treated: L UT, LS, infraspinatus, teres, lats, subscap and pecs, Lt rhomboids Electrical Stimulation Performed: No Treatment Response/Outcome: Utilized skilled palpation to identify bony landmarks and trigger points.  Able to illicit twitch response and muscle elongation.  Soft tissue mobilization to muscles needled following DN to further promote tissue elongation and decreased pain.   Scapular mobs in all directions.   01/09/24 Arm bike: level 1.2x 8 min (4/4) Lats tested for tightness  OH flexion stretch with dowel in 90/90 x 10 SL bent arm horizontal Adduction passive stretch with scapula stabilized by PT (down toward table) Open books x 10 B (bent arm when on R side), then another 10 lying on R side Standing IR with dowel behind back horizontally x 10 cues for upright posture  prone shoulder ext, rows and horizontal abduction x 20 each with 3 lb left Side lying ER, flex and horizontal ABD with 3 lbs 2 x 10 left  PATIENT EDUCATION:  Education details: Access Code: [email or web address] Person educated: Patient Education method: Explanation, Demonstration, and Handouts Education comprehension: verbalized understanding and returned demonstration  HOME EXERCISE PROGRAM:  Access Code: [email or web address] URL: [email or web address] Date: 01/09/2024 Prepared by: Mliss  Exercises - Seated Shoulder Flexion AAROM with Pulley Behind  - 3 x daily - 7 x weekly - 3 sets - 10  reps - Seated Shoulder Scaption AAROM with Pulley at Side  - 3 x daily - 7 x weekly - 3 sets - 10 reps - Seated Shoulder Internal Rotation AAROM with Pulley  - 1 x daily - 7 x weekly - 3 sets - 10 reps - Shoulder Flexion Wall Slide with Towel  - 2 x daily - 7 x weekly - 1 sets - 10 reps - 10 hold - Standing Shoulder Abduction Slides at Wall  - 1 x daily - 7 x weekly - 1 sets - 10 reps - 10 hold - Supine Shoulder External Rotation with Dowel  - 2 x daily - 7 x weekly - 2 sets - 10 reps - Standing Sleeper Stretch at [employer]  - 1 x daily - 3 x weekly - 1 sets - 3 reps - 30 hold - Sleeper Stretch  - 2 x daily - 7 x weekly - 1 sets - 3 reps - 30-60 sec hold - Sidelying Posterior Cuff Cross Body Stretch - 1/4 Turn Back  - 1 x daily - 7 x weekly - 1 sets - 5 reps - 10 sec hold - Prone Shoulder Extension - Single Arm  - 2 x daily - 7 x weekly - 2 sets - 10 reps - Prone Shoulder Row  - 2 x daily - 7 x weekly - 2 sets - 10 reps - Prone Single Arm Shoulder Horizontal Abduction with Scapular Retraction and Palm Down  - 2 x daily - 7 x weekly - 2 sets - 10 reps - Sidelying Shoulder External Rotation  - 2 x daily - 7 x weekly - 2 sets - 10 reps - Single Arm Serratus Punches in Supine with Dumbbell  - 2 x daily - 7 x weekly - 2 sets - 10 reps - Standing Bilateral Shoulder Internal Rotation AAROM with Dowel  -  2 x daily - 7 x weekly - 2 sets - 10 reps - Sidelying Open Book  - 2 x daily - 7 x weekly - 1 sets - 10 reps - 5 sec hold     ASSESSMENT:  CLINICAL IMPRESSION: Pt with overall improved functional mobility of the Lt UE with reduced scapular substitution.  Pt is stretching regularly at home and ROM has improved overall with reduced scapular substitution. Pain with daily tasks has reduced with 6/10 max pain that is brief.  Session focused on flexibility and functional strength in addition to P/ROM and mobs to improve motion.  PT monitored throughout session for posture, alignment and pain.  Good response to  dry needling with improved tissue mobility and twitch response. Patient will benefit from skilled PT to address the below impairments and improve overall function.    OBJECTIVE IMPAIRMENTS: Muscle spasm, decreased shoulder mobility, decreased shoulder strength, postural dysfunction, left shoulder pain, decreased functional capacity, pain with reaching behind back,   ACTIVITY LIMITATIONS: Reaching over head, abducting left shoulder beyond 90 deg, reaching behind back, sleeping  PARTICIPATION LIMITATIONS: cleaning, driving, community activity, occupation, and yard work  PERSONAL FACTORS: 1-2 comorbidities: Lt TKA, lung cancer  are also affecting patient's functional outcome.   REHAB POTENTIAL: Good  CLINICAL DECISION MAKING: Stable/uncomplicated  EVALUATION COMPLEXITY: Low   GOALS: Goals reviewed with patient? Yes  SHORT TERM GOALS: Target date: 01/23/2024   Be independent in initial HEP Baseline: Goal status: MET  2.  Demonstrate Lt shouler A/ROM flexion to > or = to 140 degrees to allow for reaching overhead to paint and put dishes away in cabinets.  Baseline: 128 Goal status: INITIAL  3.  Improve Lt shoulder A/ROM IR to lacking < or = to 5 inches vs the Rt to improve self-care Baseline: 6 difference (01/14/24) Goal status: In progress   4.   Improve strength 4+/5 to lift and carry objects overhead pain free.  Baseline: 4- Goal status: INITIAL    LONG TERM GOALS: Target date: 02/18/2024     Be independent in advanced HEP Baseline:  Goal status: In progress   2.  Improve Lt shoulder A/ROM ER to lacking < or = to 3 inches vs the Rt to improve self-care Baseline: 6 inches (01/15/24) Goal status: In progress   3.  Demonstrate Lt Shouler A/ROM flexion to > or = to 150 degrees to allow reaching overhead to paint without compensation  Baseline: 135 (01/15/24) Goal status: In progress   4.  Demonstrate symmetry with bilateral AROM Abduction without compensation due to  increased strength and ROM Baseline:  Goal status: INITIAL  5.  Improve strength in left shoulder to complete house work such as fixing overhead lights, paint, etc with > or = to 50% less Lt UE pain Baseline: 4-  and 3/10 pain Goal status: INITIAL  6.  Reach behind back WNL to be able to reach for seat behind while driving, reach for wallet in back pocket.  Baseline: lacking 8 inches Goal status: INITIAL   PLAN:  PT FREQUENCY: 2x/week  PT DURATION: 8 weeks  PLANNED INTERVENTIONS: 97110-Therapeutic exercises, 97530- Therapeutic activity, [id number]- Neuromuscular re-education, 97535- Self Care, [id number]- Manual therapy, [id number]- Gait training, [phone]- Aquatic Therapy, 97014- Electrical stimulation (unattended), [id number]- Electrical stimulation (manual), 97016- Vasopneumatic device, 97035- Ultrasound, Patient/Family education, Balance training, Taping, Joint mobilization, Joint manipulation, Scar mobilization, Vestibular training, and Cryotherapy  PLAN FOR NEXT SESSION: Assess dry needling and continue weekly as needed, Lt shoulder ROM, strength, manual  for ROM gains,  postural strength, measure ROM  Burnard Joy, PT 01/15/24 10:15 AM   [facility] [address], [address] [place], [place] [address] Phone # [phone] Fax [phone]

## 2024-01-21 ENCOUNTER — Ambulatory Visit

## 2024-01-21 DIAGNOSIS — M25612 Stiffness of left shoulder, not elsewhere classified: Secondary | ICD-10-CM

## 2024-01-21 DIAGNOSIS — G8929 Other chronic pain: Secondary | ICD-10-CM

## 2024-01-21 DIAGNOSIS — M6281 Muscle weakness (generalized): Secondary | ICD-10-CM

## 2024-01-21 DIAGNOSIS — R252 Cramp and spasm: Secondary | ICD-10-CM

## 2024-01-21 DIAGNOSIS — M25512 Pain in left shoulder: Secondary | ICD-10-CM | POA: Diagnosis not present

## 2024-01-21 DIAGNOSIS — R293 Abnormal posture: Secondary | ICD-10-CM

## 2024-01-21 NOTE — Therapy (Signed)
 OUTPATIENT PHYSICAL THERAPY UPPER EXTREMITY TREATMENT    Patient Name: Jesse Stewart MRN: [id number] DOB:05/28/1965, 59 y.o., male Today's Date: 01/21/2024  END OF SESSION:  PT End of Session - 01/21/24 0935     Visit Number 8    Date for PT Re-Evaluation 02/18/24    Authorization Type [facility]- 15 visits approved    Authorization - Visit Number 8    Authorization - Number of Visits 15    PT Start Time 0846    PT Stop Time 0928    PT Time Calculation (min) 42 min    Activity Tolerance Patient tolerated treatment well    Behavior During Therapy [facility] for tasks assessed/performed                 Past Medical History:  Diagnosis Date   Arthritis    Hypertension    Lung cancer (HCC) 03/13/2022   Past Surgical History:  Procedure Laterality Date   KNEE ARTHROSCOPY W/ MENISCAL REPAIR     LUNG REMOVAL, PARTIAL Right 03/27/2022   TOTAL KNEE ARTHROPLASTY Left 04/29/2023   Patient Active Problem List   Diagnosis Date Noted   Colon polyps 05/16/2019   Obesity (BMI 30.0-34.9) 05/13/2019   Chest tightness 10/06/2014   COVID 10/06/2014   Hematochezia 12/07/2012   Hernia of abdominal wall 12/07/2012   Well adult exam 04/23/2012   Neoplasm of uncertain behavior of skin 05/04/2010   ACTINIC KERATOSIS 05/04/2010   Dyslipidemia 04/04/2008   GERD 04/04/2008   Osteoarthritis 04/04/2008   KNEE PAIN 04/04/2008   ABNORMAL GLUCOSE NEC 04/04/2008   ABNORMAL LIVER FUNCTION TESTS 04/04/2008   Essential hypertension 12/29/2006    PCP: Garald Karlynn GAILS, MD   REFERRING PROVIDER: Lanell Royals, PA  REFERRING DIAG: Left Adhesive capsulitis  THERAPY DIAG:  Pain in Left shoulder  Rationale for Evaluation and Treatment: Rehabilitation  ONSET DATE: 1 month ago  SUBJECTIVE:   SUBJECTIVE STATEMENT: Pt reports improved shoulder flexibility and less pain.  Pain only with reaching out that is occasional.   Eval: Pt states he received PT for his left shoulder  earlier this year, that improved his mobility and strength. However, recently within past month pt is experiencing a flare up of pain along with significantly decreased left shoulder ROM. Pt returned back to his PCP for a check up. PCP recommended he return back to PT in hopes pt does not require surgery. Pt stated he does not want surgery. He states he has difficulty and pain reaching left arm behind his back and has night pain that wakes him up. He states he sleeps on his left side.    PERTINENT HISTORY: Lt TKA 04/29/23, Lung cancer with lung resection 03/2022 PAIN: 01/21/24:  Are you having pain? Yes: NPRS scale: 0-5/10 Pain location: Anterior shoulder, shoots down  Pain description: sharp, shooting  Aggravating factors: sleep at night, stretching arm Relieving factors: ibuprofen , aloxican hasn't taken, cream doc gave for post op knee   PRECAUTIONS: Other: history of lung cancer   RED FLAGS: None   WEIGHT BEARING RESTRICTIONS: No  FALLS:  Has patient fallen in last 6 months? 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   PLOF: Independent, Vocation/Vocational requirements: see  above, and Leisure: hike, walk, beach   PATIENT GOALS: reach all the way over and get rid of the pain as much as possible  Diagnostic: MRI on 06/27/23:  IMPRESSION:  1. Articular-sided partial tear of the supraspinatus tendon approaching 50% thickness, measuring 2 cm anterior-posterior. Background of tendinosis and surface fraying.  2. Subscapularis tendinosis and low-grade partial tear.  3. Intra-articular biceps tendinosis, possible intrasubstance partial tear near the biceps-labral complex.  4.  Imaging findings which may be seen with adhesive capsulitis.  5. Moderate AC joint arthrosis.   NEXT MD VISIT: November 2025  OBJECTIVE:  Note: Objective measures were completed at Evaluation unless otherwise noted.  COGNITION: Overall cognitive status: Within functional limits for tasks assessed     SENSATION: WFL   POSTURE: Rounded shoulders and forward head posture  PALPATION: Muscle tightness in bilateral upper traps, rotator cuff muscles on left side  joint hypomobility with A-P glides, and inferior glides to left shoulder Decreased scapular mobility on left side UPPER EXTREMITY ROM:  AROM Left  Left  8/20 post manual (seated) Lt  01/07/24 Lt Lt  01/15/24  Flexion  128 134 130- reduced scapular elevation 135 sitting 146 supine Sitting 135  Abduction* 90 95 105    IR Lacking 8 inches vs Rt   Lacking 6 inch vs Rt (post stretches) about L5 Lacking 6 inches vs Rt   ER Lacking 6 inches vs Rt       PROM   Left: Flexion=116, Abduction*=80    (Blank rows = not tested)  UPPER EXTREMITY MMT:  MMT Right eval Left eval  Shoulder flexion   4 4  Shoulder Abd* 4 4-  ER 4 4-  IR 4 4-  Extension 4 4-                               (Blank rows = not tested)                                                                                                                    TREATMENT DATE:  01/21/24 Arm bike: level 2x 8 min (4/4)-PT present to discuss progress  Finger ladder with 1# added to wrist: flexion and scaption x10 each   OH flexion stretch with dowel in 90/90 x 10 SL bent arm horizontal Adduction passive stretch with scapula stabilized by PT (down toward table) Standing IR with dowel behind back horizontally x 10 cues for upright posture  prone shoulder ext, rows and horizontal abduction x 20 each with 3 lb left Side lying ER, flex and ABD with 3 lbs 2 x 10 left Manual: P/ROM and joint mobs to Lt shoulder in all directions   01/15/24 Arm bike: level 2x 8 min  (4/4)-PT present to discuss progress   OH flexion stretch with dowel in 90/90 x 10 SL bent arm horizontal Adduction passive stretch with scapula stabilized by PT (down toward table) Standing IR with dowel behind  back horizontally x 10 cues for upright posture  prone shoulder ext, rows and horizontal abduction x 20 each with 3 lb left Side lying ER, flex and ABD with 3 lbs 2 x 10 left Manual: P/ROM and joint mobs to Lt shoulder in all directions   01/14/24 Arm bike: level 1.5x 8 min (4/4)-PT present to discuss progress  Wall ladder: flexion and scaption x10  OH flexion stretch with dowel in 90/90 x 10 Open books x 10 B (bent arm when on R side), then another 10 lying on R side Standing IR with dowel behind back horizontally x 10 cues for upright posture Standing flexion and scaption with 2# weight- tactile cues for scapular depression 2x10 on Lt Side lying ER, flex and horizontal ABD with 3 lbs 2 x 10 left Trigger Point Dry Needling  Subsequent Treatment: Instructions provided previously at initial dry needling treatment.   Patient Verbal Consent Given: Yes Education Handout Provided: Previously Provided Muscles Treated: L UT, LS, infraspinatus, teres, lats, subscap and pecs, Lt rhomboids Electrical Stimulation Performed: No Treatment Response/Outcome: Utilized skilled palpation to identify bony landmarks and trigger points.  Able to illicit twitch response and muscle elongation.  Soft tissue mobilization to muscles needled following DN to further promote tissue elongation and decreased pain.   Scapular mobs in all directions.    PATIENT EDUCATION:  Education details: Access Code: [email or web address] Person educated: Patient Education method: Explanation, Demonstration, and Handouts Education comprehension: verbalized understanding and returned demonstration  HOME EXERCISE PROGRAM:  Access Code: [email or web address] URL: [email or web address] Date: 01/09/2024 Prepared by:  Mliss  Exercises - Seated Shoulder Flexion AAROM with Pulley Behind  - 3 x daily - 7 x weekly - 3 sets - 10 reps - Seated Shoulder Scaption AAROM with Pulley at Side  - 3 x daily - 7 x weekly - 3 sets - 10 reps - Seated Shoulder Internal Rotation AAROM with Pulley  - 1 x daily - 7 x weekly - 3 sets - 10 reps - Shoulder Flexion Wall Slide with Towel  - 2 x daily - 7 x weekly - 1 sets - 10 reps - 10 hold - Standing Shoulder Abduction Slides at Wall  - 1 x daily - 7 x weekly - 1 sets - 10 reps - 10 hold - Supine Shoulder External Rotation with Dowel  - 2 x daily - 7 x weekly - 2 sets - 10 reps - Standing Sleeper Stretch at [employer]  - 1 x daily - 3 x weekly - 1 sets - 3 reps - 30 hold - Sleeper Stretch  - 2 x daily - 7 x weekly - 1 sets - 3 reps - 30-60 sec hold - Sidelying Posterior Cuff Cross Body Stretch - 1/4 Turn Back  - 1 x daily - 7 x weekly - 1 sets - 5 reps - 10 sec hold - Prone Shoulder Extension - Single Arm  - 2 x daily - 7 x weekly - 2 sets - 10 reps - Prone Shoulder Row  - 2 x daily - 7 x weekly - 2 sets - 10 reps - Prone Single Arm Shoulder Horizontal Abduction with Scapular Retraction and Palm Down  - 2 x daily - 7 x weekly - 2 sets - 10 reps - Sidelying Shoulder External Rotation  - 2 x daily - 7 x weekly - 2 sets - 10 reps - Single Arm Serratus Punches in Supine with Dumbbell  - 2 x daily - 7 x weekly - 2 sets -  10 reps - Standing Bilateral Shoulder Internal Rotation AAROM with Dowel  - 2 x daily - 7 x weekly - 2 sets - 10 reps - Sidelying Open Book  - 2 x daily - 7 x weekly - 1 sets - 10 reps - 5 sec hold     ASSESSMENT:  CLINICAL IMPRESSION: Pt with overall improved functional mobility of the Lt UE with reduced scapular substitution.  Pt is able to lift overhead with increased ease and minimal to no pain.  Session focused on flexibility and functional strength in addition to P/ROM and mobs to improve motion.  PT monitored throughout session for posture, alignment and pain.  Pt  remains limited in IR A/ROM and is working with his pulleys at home in all directions.  Patient will benefit from skilled PT to address the below impairments and improve overall function.    OBJECTIVE IMPAIRMENTS: Muscle spasm, decreased shoulder mobility, decreased shoulder strength, postural dysfunction, left shoulder pain, decreased functional capacity, pain with reaching behind back,   ACTIVITY LIMITATIONS: Reaching over head, abducting left shoulder beyond 90 deg, reaching behind back, sleeping  PARTICIPATION LIMITATIONS: cleaning, driving, community activity, occupation, and yard work  PERSONAL FACTORS: 1-2 comorbidities: Lt TKA, lung cancer  are also affecting patient's functional outcome.   REHAB POTENTIAL: Good  CLINICAL DECISION MAKING: Stable/uncomplicated  EVALUATION COMPLEXITY: Low   GOALS: Goals reviewed with patient? Yes  SHORT TERM GOALS: Target date: 01/23/2024   Be independent in initial HEP Baseline: Goal status: MET  2.  Demonstrate Lt shouler A/ROM flexion to > or = to 140 degrees to allow for reaching overhead to paint and put dishes away in cabinets.  Baseline: 128 Goal status: INITIAL  3.  Improve Lt shoulder A/ROM IR to lacking < or = to 5 inches vs the Rt to improve self-care Baseline: 6 difference (01/14/24) Goal status: In progress   4.   Improve strength 4+/5 to lift and carry objects overhead pain free.  Baseline: 4-, no pain with lifting (01/21/24) Goal status: in progress     LONG TERM GOALS: Target date: 02/18/2024     Be independent in advanced HEP Baseline:  Goal status: In progress   2.  Improve Lt shoulder A/ROM ER to lacking < or = to 3 inches vs the Rt to improve self-care Baseline: 6 inches (01/15/24) Goal status: In progress   3.  Demonstrate Lt Shouler A/ROM flexion to > or = to 150 degrees to allow reaching overhead to paint without compensation  Baseline: 135 (01/15/24) Goal status: In progress   4.  Demonstrate symmetry  with bilateral AROM Abduction without compensation due to increased strength and ROM Baseline:  Goal status: INITIAL  5.  Improve strength in left shoulder to complete house work such as fixing overhead lights, paint, etc with > or = to 50% less Lt UE pain Baseline: 4-  and 3/10 pain Goal status: INITIAL  6.  Reach behind back WNL to be able to reach for seat behind while driving, reach for wallet in back pocket.  Baseline: lacking 8 inches Goal status: INITIAL   PLAN:  PT FREQUENCY: 2x/week  PT DURATION: 8 weeks  PLANNED INTERVENTIONS: 97110-Therapeutic exercises, 97530- Therapeutic activity, [id number]- Neuromuscular re-education, 97535- Self Care, [id number]- Manual therapy, [id number]- Gait training, [phone]- Aquatic Therapy, 97014- Electrical stimulation (unattended), [id number]- Electrical stimulation (manual), 97016- Vasopneumatic device, 97035- Ultrasound, Patient/Family education, Balance training, Taping, Joint mobilization, Joint manipulation, Scar mobilization, Vestibular training, and Cryotherapy  PLAN FOR NEXT SESSION: dry  needling again to Lt shoulder/scapula, Lt shoulder ROM, strength, manual for ROM gains,  postural strength, measure ROM  Burnard Joy, PT 01/21/24 9:37 AM   [facility] [address], [address] [place], [place] [address] Phone # [phone] Fax [phone]

## 2024-01-22 NOTE — Therapy (Addendum)
 OUTPATIENT PHYSICAL THERAPY UPPER EXTREMITY TREATMENT    Patient Name: Jesse Stewart MRN: [id number] DOB:December 27, 1965, 58 y.o., male Today's Date: 01/23/2024  END OF SESSION:  PT End of Session - 01/23/24 0936     Visit Number 9    Number of Visits 15    Date for PT Re-Evaluation 02/18/24    Authorization Type [facility]- 15 visits approved    Authorization - Visit Number 9    Authorization - Number of Visits 15    PT Start Time 0934    PT Stop Time 1014    PT Time Calculation (min) 40 min    Activity Tolerance Patient tolerated treatment well                  Past Medical History:  Diagnosis Date   Arthritis    Hypertension    Lung cancer (HCC) 03/13/2022   Past Surgical History:  Procedure Laterality Date   KNEE ARTHROSCOPY W/ MENISCAL REPAIR     LUNG REMOVAL, PARTIAL Right 03/27/2022   TOTAL KNEE ARTHROPLASTY Left 04/29/2023   Patient Active Problem List   Diagnosis Date Noted   Colon polyps 05/16/2019   Obesity (BMI 30.0-34.9) 05/13/2019   Chest tightness 10/06/2014   COVID 10/06/2014   Hematochezia 12/07/2012   Hernia of abdominal wall 12/07/2012   Well adult exam 04/23/2012   Neoplasm of uncertain behavior of skin 05/04/2010   ACTINIC KERATOSIS 05/04/2010   Dyslipidemia 04/04/2008   GERD 04/04/2008   Osteoarthritis 04/04/2008   KNEE PAIN 04/04/2008   ABNORMAL GLUCOSE NEC 04/04/2008   ABNORMAL LIVER FUNCTION TESTS 04/04/2008   Essential hypertension 12/29/2006    PCP: Garald Karlynn GAILS, MD   REFERRING PROVIDER: Lanell Royals, PA  REFERRING DIAG: Left Adhesive capsulitis  THERAPY DIAG:  Pain in Left shoulder  Rationale for Evaluation and Treatment: Rehabilitation  ONSET DATE: 1 month ago  SUBJECTIVE:   SUBJECTIVE STATEMENT: Doing great.  Eval: Pt states he received PT for his left shoulder earlier this year, that improved his mobility and strength. However, recently within past month pt is experiencing a flare up of pain  along with significantly decreased left shoulder ROM. Pt returned back to his PCP for a check up. PCP recommended he return back to PT in hopes pt does not require surgery. Pt stated he does not want surgery. He states he has difficulty and pain reaching left arm behind his back and has night pain that wakes him up. He states he sleeps on his left side.    PERTINENT HISTORY: Lt TKA 04/29/23, Lung cancer with lung resection 03/2022 PAIN: 01/21/24:  Are you having pain? Yes: NPRS scale: 0-5/10 Pain location: Anterior shoulder, shoots down  Pain description: sharp, shooting  Aggravating factors: sleep at night, stretching arm Relieving factors: ibuprofen , aloxican hasn't taken, cream doc gave for post op knee   PRECAUTIONS: Other: history of lung cancer   RED FLAGS: None   WEIGHT BEARING RESTRICTIONS: No  FALLS:  Has patient fallen in last 6 months? 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   PLOF: Independent, Vocation/Vocational requirements: see above, and Leisure: hike, walk, beach   PATIENT GOALS: reach all the way over and get rid  of the pain as much as possible  Diagnostic: MRI on 06/27/23:  IMPRESSION:  1. Articular-sided partial tear of the supraspinatus tendon approaching 50% thickness, measuring 2 cm anterior-posterior. Background of tendinosis and surface fraying.  2. Subscapularis tendinosis and low-grade partial tear.  3. Intra-articular biceps tendinosis, possible intrasubstance partial tear near the biceps-labral complex.  4. Imaging findings which may be seen with adhesive capsulitis.  5. Moderate AC joint arthrosis.   NEXT MD VISIT: November  2025  OBJECTIVE:  Note: Objective measures were completed at Evaluation unless otherwise noted.  COGNITION: Overall cognitive status: Within functional limits for tasks assessed     SENSATION: WFL   POSTURE: Rounded shoulders and forward head posture  PALPATION: Muscle tightness in bilateral upper traps, rotator cuff muscles on left side  joint hypomobility with A-P glides, and inferior glides to left shoulder Decreased scapular mobility on left side UPPER EXTREMITY ROM:  AROM Left  Left  8/20 post manual (seated) Lt  01/07/24 Lt Lt  01/15/24  Flexion  128 134 130- reduced scapular elevation 135 sitting 146 supine Sitting 135  Abduction* 90 95 105    IR Lacking 8 inches vs Rt   Lacking 6 inch vs Rt (post stretches) about L5 Lacking 6 inches vs Rt   ER Lacking 6 inches vs Rt       PROM   Left: Flexion=116, Abduction*=80    (Blank rows = not tested)  UPPER EXTREMITY MMT:  MMT Right eval Left eval Left 01/23/24  Shoulder flexion   4 4 4+  Shoulder Abd* 4 4- 4+  ER 4 4- 4+  IR 4 4- 5  Extension 4 4- 5                                      (Blank rows = not tested)                                                                                                                    TREATMENT DATE:  01/23/24 Finger ladder with 1# added to wrist: flexion and scaption x 30 each  Standing IR with dowel behind back horizontally x 10 cues for upright posture MMT Trigger Point Dry Needling  Subsequent Treatment: Instructions provided previously at initial dry needling treatment.   Patient Verbal Consent Given: Yes Education Handout Provided: Previously Provided Muscles Treated: L UT,  infraspinatus, teres, lats, deltoids and pecs Electrical Stimulation Performed: No Treatment Response/Outcome: Utilized skilled palpation to identify bony landmarks and trigger points.  Able to illicit twitch response and muscle elongation.  Soft tissue mobilization to muscles needled  following DN to further promote tissue elongation and decreased pain.      01/21/24 Arm bike: level 2x 8 min (4/4)-PT present to discuss progress  Finger ladder with 1# added to wrist: flexion and scaption x10 each   OH flexion stretch with dowel in 90/90 x 10 SL bent  arm horizontal Adduction passive stretch with scapula stabilized by PT (down toward table) Standing IR with dowel behind back horizontally x 10 cues for upright posture  prone shoulder ext, rows and horizontal abduction x 20 each with 3 lb left Side lying ER, flex and ABD with 3 lbs 2 x 10 left Manual: P/ROM and joint mobs to Lt shoulder in all directions   01/15/24 Arm bike: level 2x 8 min (4/4)-PT present to discuss progress   OH flexion stretch with dowel in 90/90 x 10 SL bent arm horizontal Adduction passive stretch with scapula stabilized by PT (down toward table) Standing IR with dowel behind back horizontally x 10 cues for upright posture  prone shoulder ext, rows and horizontal abduction x 20 each with 3 lb left Side lying ER, flex and ABD with 3 lbs 2 x 10 left Manual: P/ROM and joint mobs to Lt shoulder in all directions   01/14/24 Arm bike: level 1.5x 8 min (4/4)-PT present to discuss progress  Wall ladder: flexion and scaption x10  OH flexion stretch with dowel in 90/90 x 10 Open books x 10 B (bent arm when on R side), then another 10 lying on R side Standing IR with dowel behind back horizontally x 10 cues for upright posture Standing flexion and scaption with 2# weight- tactile cues for scapular depression 2x10 on Lt Side lying ER, flex and horizontal ABD with 3 lbs 2 x 10 left Trigger Point Dry Needling  Subsequent Treatment: Instructions provided previously at initial dry needling treatment.   Patient Verbal Consent Given: Yes Education Handout Provided: Previously Provided Muscles Treated: L UT, LS, infraspinatus, teres, lats, subscap and pecs, Lt rhomboids Electrical Stimulation Performed: No Treatment  Response/Outcome: Utilized skilled palpation to identify bony landmarks and trigger points.  Able to illicit twitch response and muscle elongation.  Soft tissue mobilization to muscles needled following DN to further promote tissue elongation and decreased pain.   Scapular mobs in all directions.    PATIENT EDUCATION:  Education details: Access Code: [email or web address] Person educated: Patient Education method: Explanation, Demonstration, and Handouts Education comprehension: verbalized understanding and returned demonstration  HOME EXERCISE PROGRAM:  Access Code: [email or web address] URL: [email or web address] Date: 01/09/2024 Prepared by: Mliss  Exercises - Seated Shoulder Flexion AAROM with Pulley Behind  - 3 x daily - 7 x weekly - 3 sets - 10 reps - Seated Shoulder Scaption AAROM with Pulley at Side  - 3 x daily - 7 x weekly - 3 sets - 10 reps - Seated Shoulder Internal Rotation AAROM with Pulley  - 1 x daily - 7 x weekly - 3 sets - 10 reps - Shoulder Flexion Wall Slide with Towel  - 2 x daily - 7 x weekly - 1 sets - 10 reps - 10 hold - Standing Shoulder Abduction Slides at Wall  - 1 x daily - 7 x weekly - 1 sets - 10 reps - 10 hold - Supine Shoulder External Rotation with Dowel  - 2 x daily - 7 x weekly - 2 sets - 10 reps - Standing Sleeper Stretch at [employer]  - 1 x daily - 3 x weekly - 1 sets - 3 reps - 30 hold - Sleeper Stretch  - 2 x daily - 7 x weekly - 1 sets - 3 reps - 30-60 sec hold - Sidelying Posterior Cuff Cross Body Stretch - 1/4 Turn Back  - 1 x daily - 7 x weekly - 1 sets - 5 reps - 10 sec hold -  Prone Shoulder Extension - Single Arm  - 2 x daily - 7 x weekly - 2 sets - 10 reps - Prone Shoulder Row  - 2 x daily - 7 x weekly - 2 sets - 10 reps - Prone Single Arm Shoulder Horizontal Abduction with Scapular Retraction and Palm Down  - 2 x daily - 7 x weekly - 2 sets - 10 reps - Sidelying Shoulder External Rotation  - 2 x daily - 7 x weekly - 2 sets - 10 reps - Single Arm Serratus  Punches in Supine with Dumbbell  - 2 x daily - 7 x weekly - 2 sets - 10 reps - Standing Bilateral Shoulder Internal Rotation AAROM with Dowel  - 2 x daily - 7 x weekly - 2 sets - 10 reps - Sidelying Open Book  - 2 x daily - 7 x weekly - 1 sets - 10 reps - 5 sec hold     ASSESSMENT:  CLINICAL IMPRESSION: Patient continues to progress. He was able to fix light fixtures for 30-40 min with just tightness reported at the end. He still feels discomfort in the deltoid region with reach forward (such as doing laundry). His strength has significantly improved. He has mild discomfort with resisted ER and ABD. Excellent response to DN today in lats and deltoids. No improvement in OH flexion ROM today. IR is slowly progressing, but is still the most limited.    OBJECTIVE IMPAIRMENTS: Muscle spasm, decreased shoulder mobility, decreased shoulder strength, postural dysfunction, left shoulder pain, decreased functional capacity, pain with reaching behind back,   ACTIVITY LIMITATIONS: Reaching over head, abducting left shoulder beyond 90 deg, reaching behind back, sleeping  PARTICIPATION LIMITATIONS: cleaning, driving, community activity, occupation, and yard work  PERSONAL FACTORS: 1-2 comorbidities: Lt TKA, lung cancer  are also affecting patient's functional outcome.   REHAB POTENTIAL: Good  CLINICAL DECISION MAKING: Stable/uncomplicated  EVALUATION COMPLEXITY: Low   GOALS: Goals reviewed with patient? Yes  SHORT TERM GOALS: Target date: 01/23/2024   Be independent in initial HEP Baseline: Goal status: MET  2.  Demonstrate Lt shouler A/ROM flexion to > or = to 140 degrees to allow for reaching overhead to paint and put dishes away in cabinets.  Baseline: 128 Goal status: INITIAL  3.  Improve Lt shoulder A/ROM IR to lacking < or = to 5 inches vs the Rt to improve self-care Baseline: 6 difference (01/14/24) Goal status: In progress   4.   Improve strength 4+/5 to lift and carry objects  overhead pain free.  Baseline: 4-, no pain with lifting (01/21/24) Goal status: partially MET 9/12 (4+ to 5/5)    LONG TERM GOALS: Target date: 02/18/2024     Be independent in advanced HEP Baseline:  Goal status: In progress   2.  Improve Lt shoulder A/ROM ER to lacking < or = to 3 inches vs the Rt to improve self-care Baseline: 6 inches (01/15/24) Goal status: In progress   3.  Demonstrate Lt Shouler A/ROM flexion to > or = to 150 degrees to allow reaching overhead to paint without compensation  Baseline: 135 (01/15/24) Goal status: In progress   4.  Demonstrate symmetry with bilateral AROM Abduction without compensation due to increased strength and ROM Baseline:  Goal status: INITIAL  5.  Improve strength in left shoulder to complete house work such as fixing overhead lights, paint, etc with > or = to 50% less Lt UE pain Baseline: 4-  and 3/10 pain Goal status: MET  01/23/24  6.  Reach behind back WNL to be able to reach for seat behind while driving, reach for wallet in back pocket.  Baseline: lacking 8 inches Goal status: INITIAL   PLAN:  PT FREQUENCY: 2x/week  PT DURATION: 8 weeks  PLANNED INTERVENTIONS: 97110-Therapeutic exercises, 97530- Therapeutic activity, 97112- Neuromuscular re-education, 97535- Self Care, [id number]- Manual therapy, [phone]- Gait training, [phone]- Aquatic Therapy, 97014- Electrical stimulation (unattended), [phone]- Electrical stimulation (manual), 97016- Vasopneumatic device, 97035- Ultrasound, Patient/Family education, Balance training, Taping, Joint mobilization, Joint manipulation, Scar mobilization, Vestibular training, and Cryotherapy  PLAN FOR NEXT SESSION: assess dry needling, Lt shoulder ROM, strength, manual for ROM gains,  postural strength, measure ROM  Mliss Cummins, PT  01/23/24 12:08 PM   [facility] [address], [address] [place], [place] [address] Phone # [phone] Fax [phone]

## 2024-01-23 ENCOUNTER — Ambulatory Visit: Admitting: Physical Therapy

## 2024-01-23 ENCOUNTER — Encounter: Payer: Self-pay | Admitting: Physical Therapy

## 2024-01-23 DIAGNOSIS — M6281 Muscle weakness (generalized): Secondary | ICD-10-CM

## 2024-01-23 DIAGNOSIS — G8929 Other chronic pain: Secondary | ICD-10-CM

## 2024-01-23 DIAGNOSIS — M25612 Stiffness of left shoulder, not elsewhere classified: Secondary | ICD-10-CM

## 2024-01-23 DIAGNOSIS — R252 Cramp and spasm: Secondary | ICD-10-CM

## 2024-01-23 DIAGNOSIS — R293 Abnormal posture: Secondary | ICD-10-CM

## 2024-01-23 DIAGNOSIS — M25512 Pain in left shoulder: Secondary | ICD-10-CM | POA: Diagnosis not present

## 2024-01-28 ENCOUNTER — Ambulatory Visit

## 2024-01-28 DIAGNOSIS — M6281 Muscle weakness (generalized): Secondary | ICD-10-CM

## 2024-01-28 DIAGNOSIS — M25512 Pain in left shoulder: Secondary | ICD-10-CM | POA: Diagnosis not present

## 2024-01-28 DIAGNOSIS — R293 Abnormal posture: Secondary | ICD-10-CM

## 2024-01-28 DIAGNOSIS — R252 Cramp and spasm: Secondary | ICD-10-CM

## 2024-01-28 DIAGNOSIS — G8929 Other chronic pain: Secondary | ICD-10-CM

## 2024-01-28 DIAGNOSIS — M25612 Stiffness of left shoulder, not elsewhere classified: Secondary | ICD-10-CM

## 2024-01-28 NOTE — Therapy (Signed)
 OUTPATIENT PHYSICAL THERAPY UPPER EXTREMITY TREATMENT    Patient Name: Jesse Stewart MRN: [id number] DOB:[birth year], 58 y.o., male Today's Date: 01/28/2024  END OF SESSION:  PT End of Session - 01/28/24 0932     Visit Number 10    Date for PT Re-Evaluation 02/18/24    Authorization Type [facility]- 15 visits approved    Authorization - Visit Number 10    Authorization - Number of Visits 15    PT Start Time 0850    PT Stop Time 0931    PT Time Calculation (min) 41 min    Activity Tolerance Patient tolerated treatment well    Behavior During Therapy [facility] for tasks assessed/performed                   Past Medical History:  Diagnosis Date   Arthritis    Hypertension    Lung cancer (HCC) 03/13/2022   Past Surgical History:  Procedure Laterality Date   KNEE ARTHROSCOPY W/ MENISCAL REPAIR     LUNG REMOVAL, PARTIAL Right 03/27/2022   TOTAL KNEE ARTHROPLASTY Left 04/29/2023   Patient Active Problem List   Diagnosis Date Noted   Colon polyps 05/16/2019   Obesity (BMI 30.0-34.9) 05/13/2019   Chest tightness 10/06/2014   COVID 10/06/2014   Hematochezia 12/07/2012   Hernia of abdominal wall 12/07/2012   Well adult exam 04/23/2012   Neoplasm of uncertain behavior of skin 05/04/2010   ACTINIC KERATOSIS 05/04/2010   Dyslipidemia 04/04/2008   GERD 04/04/2008   Osteoarthritis 04/04/2008   KNEE PAIN 04/04/2008   ABNORMAL GLUCOSE NEC 04/04/2008   ABNORMAL LIVER FUNCTION TESTS 04/04/2008   Essential hypertension 12/29/2006    PCP: Garald Karlynn GAILS, MD   REFERRING PROVIDER: Lanell Royals, PA  REFERRING DIAG: Left Adhesive capsulitis  THERAPY DIAG:  Pain in Left shoulder  Rationale for Evaluation and Treatment: Rehabilitation  ONSET DATE: 1 month ago  SUBJECTIVE:   SUBJECTIVE STATEMENT: My shoulder is getting better.  The needling is helping.   Eval: Pt states he received PT for his left shoulder earlier this year, that improved his mobility  and strength. However, recently within past month pt is experiencing a flare up of pain along with significantly decreased left shoulder ROM. Pt returned back to his PCP for a check up. PCP recommended he return back to PT in hopes pt does not require surgery. Pt stated he does not want surgery. He states he has difficulty and pain reaching left arm behind his back and has night pain that wakes him up. He states he sleeps on his left side.    PERTINENT HISTORY: Lt TKA 04/29/23, Lung cancer with lung resection 03/2022 PAIN: 01/28/24:  Are you having pain? Yes: NPRS scale: 0-5/10 Pain location: Anterior shoulder, shoots down  Pain description: sharp, shooting  Aggravating factors: sleep at night, stretching arm Relieving factors: ibuprofen , aloxican hasn't taken, cream doc gave for post op knee   PRECAUTIONS: Other: history of lung cancer   RED FLAGS: None   WEIGHT BEARING RESTRICTIONS: No  FALLS:  Has patient fallen in last 6 months? 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   PLOF: Independent, Vocation/Vocational requirements: see above, and Leisure: hike, walk,  beach   PATIENT GOALS: reach all the way over and get rid of the pain as much as possible  Diagnostic: MRI on 06/27/23:  IMPRESSION:  1. Articular-sided partial tear of the supraspinatus tendon approaching 50% thickness, measuring 2 cm anterior-posterior. Background of tendinosis and surface fraying.  2. Subscapularis tendinosis and low-grade partial tear.  3. Intra-articular biceps tendinosis, possible intrasubstance partial tear near the biceps-labral complex.  4. Imaging findings which may be seen with adhesive  capsulitis.  5. Moderate AC joint arthrosis.   NEXT MD VISIT: November 2025  OBJECTIVE:  Note: Objective measures were completed at Evaluation unless otherwise noted.  COGNITION: Overall cognitive status: Within functional limits for tasks assessed     SENSATION: WFL   POSTURE: Rounded shoulders and forward head posture  PALPATION: Muscle tightness in bilateral upper traps, rotator cuff muscles on left side  joint hypomobility with A-P glides, and inferior glides to left shoulder Decreased scapular mobility on left side UPPER EXTREMITY ROM:  AROM Left  Left  8/20 post manual (seated) Lt  01/07/24 Lt Lt  01/15/24  Flexion  128 134 130- reduced scapular elevation 135 sitting 146 supine Sitting 135  Abduction* 90 95 105    IR Lacking 8 inches vs Rt   Lacking 6 inch vs Rt (post stretches) about L5 Lacking 6 inches vs Rt   ER Lacking 6 inches vs Rt       PROM   Left: Flexion=116, Abduction*=80    (Blank rows = not tested)  UPPER EXTREMITY MMT:  MMT Right eval Left eval Left 01/23/24  Shoulder flexion   4 4 4+  Shoulder Abd* 4 4- 4+  ER 4 4- 4+  IR 4 4- 5  Extension 4 4- 5                                      (Blank rows = not tested)                                                                                                                    TREATMENT DATE:  01/28/24 Arm bike: level 2x 8 min (4/4)-PT present to discuss progress  Finger ladder with 1.5# added to wrist: flexion and scaption x10 each  Ball roll outs with overpressure hold x 10- 3 ways  Standing IR with dowel behind back horizontally x 10 cues for upright posture  prone shoulder ext, rows and horizontal abduction x 20 each with 3 lb left Side lying ER, flex and ABD with 3 lbs 2 x 10 left Manual: P/ROM and joint mobs to Lt shoulder in all directions     01/23/24 Finger ladder with 1# added to wrist: flexion and scaption x 30 each  Standing IR with dowel behind back horizontally x 10 cues  for upright posture MMT Trigger Point Dry Needling  Subsequent Treatment: Instructions provided previously at initial dry needling treatment.  Patient Verbal Consent Given: Yes Education Handout Provided: Previously Provided Muscles Treated: L UT,  infraspinatus, teres, lats, deltoids and pecs Electrical Stimulation Performed: No Treatment Response/Outcome: Utilized skilled palpation to identify bony landmarks and trigger points.  Able to illicit twitch response and muscle elongation.  Soft tissue mobilization to muscles needled following DN to further promote tissue elongation and decreased pain.      01/21/24 Arm bike: level 2x 8 min (4/4)-PT present to discuss progress  Finger ladder with 1# added to wrist: flexion and scaption x10 each   OH flexion stretch with dowel in 90/90 x 10 SL bent arm horizontal Adduction passive stretch with scapula stabilized by PT (down toward table) Standing IR with dowel behind back horizontally x 10 cues for upright posture  prone shoulder ext, rows and horizontal abduction x 20 each with 3 lb left Side lying ER, flex and ABD with 3 lbs 2 x 10 left Manual: P/ROM and joint mobs to Lt shoulder in all directions     PATIENT EDUCATION:  Education details: Access Code: [email or web address] Person educated: Patient Education method: Explanation, Demonstration, and Handouts Education comprehension: verbalized understanding and returned demonstration  HOME EXERCISE PROGRAM:  Access Code: [email or web address] URL: [email or web address] Date: 01/09/2024 Prepared by: Mliss  Exercises - Seated Shoulder Flexion AAROM with Pulley Behind  - 3 x daily - 7 x weekly - 3 sets - 10 reps - Seated Shoulder Scaption AAROM with Pulley at Side  - 3 x daily - 7 x weekly - 3 sets - 10 reps - Seated Shoulder Internal Rotation AAROM with Pulley  - 1 x daily - 7 x weekly - 3 sets - 10 reps - Shoulder Flexion Wall Slide with Towel  - 2 x daily - 7 x weekly - 1 sets - 10 reps - 10  hold - Standing Shoulder Abduction Slides at Wall  - 1 x daily - 7 x weekly - 1 sets - 10 reps - 10 hold - Supine Shoulder External Rotation with Dowel  - 2 x daily - 7 x weekly - 2 sets - 10 reps - Standing Sleeper Stretch at [employer]  - 1 x daily - 3 x weekly - 1 sets - 3 reps - 30 hold - Sleeper Stretch  - 2 x daily - 7 x weekly - 1 sets - 3 reps - 30-60 sec hold - Sidelying Posterior Cuff Cross Body Stretch - 1/4 Turn Back  - 1 x daily - 7 x weekly - 1 sets - 5 reps - 10 sec hold - Prone Shoulder Extension - Single Arm  - 2 x daily - 7 x weekly - 2 sets - 10 reps - Prone Shoulder Row  - 2 x daily - 7 x weekly - 2 sets - 10 reps - Prone Single Arm Shoulder Horizontal Abduction with Scapular Retraction and Palm Down  - 2 x daily - 7 x weekly - 2 sets - 10 reps - Sidelying Shoulder External Rotation  - 2 x daily - 7 x weekly - 2 sets - 10 reps - Single Arm Serratus Punches in Supine with Dumbbell  - 2 x daily - 7 x weekly - 2 sets - 10 reps - Standing Bilateral Shoulder Internal Rotation AAROM with Dowel  - 2 x daily - 7 x weekly - 2 sets - 10 reps - Sidelying Open Book  - 2 x daily - 7 x weekly - 1 sets - 10 reps - 5 sec hold   ASSESSMENT:  CLINICAL  IMPRESSION: Pt continues to be able to perform tasks overhead and with reaching out with more ease. He continues to work on flexibility and endurance with HEP.  His strength and endurance for use of Lt UE have significantly improved since the start of care. He has mild discomfort with resisted ER and ABD. No significant improvement in Lt flexion A/ROM and IR is slowly progressing, but is still the most limited vs the Rt. Patient will benefit from skilled PT to address the below impairments and improve overall function.    OBJECTIVE IMPAIRMENTS: Muscle spasm, decreased shoulder mobility, decreased shoulder strength, postural dysfunction, left shoulder pain, decreased functional capacity, pain with reaching behind back,   ACTIVITY LIMITATIONS: Reaching  over head, abducting left shoulder beyond 90 deg, reaching behind back, sleeping  PARTICIPATION LIMITATIONS: cleaning, driving, community activity, occupation, and yard work  PERSONAL FACTORS: 1-2 comorbidities: Lt TKA, lung cancer  are also affecting patient's functional outcome.   REHAB POTENTIAL: Good  CLINICAL DECISION MAKING: Stable/uncomplicated  EVALUATION COMPLEXITY: Low   GOALS: Goals reviewed with patient? Yes  SHORT TERM GOALS: Target date: 01/23/2024   Be independent in initial HEP Baseline: Goal status: MET  2.  Demonstrate Lt shouler A/ROM flexion to > or = to 140 degrees to allow for reaching overhead to paint and put dishes away in cabinets.  Baseline: 128 Goal status: INITIAL  3.  Improve Lt shoulder A/ROM IR to lacking < or = to 5 inches vs the Rt to improve self-care Baseline: 6 difference (01/14/24) Goal status: In progress   4.   Improve strength 4+/5 to lift and carry objects overhead pain free.  Baseline: 4-, no pain with lifting (01/21/24) Goal status: partially MET 9/12 (4+ to 5/5)    LONG TERM GOALS: Target date: 02/18/2024     Be independent in advanced HEP Baseline:  Goal status: In progress   2.  Improve Lt shoulder A/ROM ER to lacking < or = to 3 inches vs the Rt to improve self-care Baseline: 6 inches (01/15/24) Goal status: In progress   3.  Demonstrate Lt Shouler A/ROM flexion to > or = to 150 degrees to allow reaching overhead to paint without compensation  Baseline: 135 (01/15/24) Goal status: In progress   4.  Demonstrate symmetry with bilateral AROM Abduction without compensation due to increased strength and ROM Baseline:  Goal status: INITIAL  5.  Improve strength in left shoulder to complete house work such as fixing overhead lights, paint, etc with > or = to 50% less Lt UE pain Baseline: 4-  and 3/10 pain Goal status: MET  01/23/24  6.  Reach behind back WNL to be able to reach for seat behind while driving, reach for wallet  in back pocket.  Baseline: lacking 8 inches Goal status: INITIAL   PLAN:  PT FREQUENCY: 2x/week  PT DURATION: 8 weeks  PLANNED INTERVENTIONS: 97110-Therapeutic exercises, 97530- Therapeutic activity, 97112- Neuromuscular re-education, 97535- Self Care, [id number]- Manual therapy, [phone]- Gait training, [phone]- Aquatic Therapy, 97014- Electrical stimulation (unattended), [phone]- Electrical stimulation (manual), 97016- Vasopneumatic device, 97035- Ultrasound, Patient/Family education, Balance training, Taping, Joint mobilization, Joint manipulation, Scar mobilization, Vestibular training, and Cryotherapy  PLAN FOR NEXT SESSION:  Lt shoulder ROM, strength, manual for ROM gains,  postural strength, dry needling next session  Burnard Joy, PT 01/28/24 9:33 AM   [facility] [address] [place], [place] [address] Phone # [phone] Fax [phone]

## 2024-01-29 NOTE — Therapy (Signed)
 OUTPATIENT PHYSICAL THERAPY UPPER EXTREMITY TREATMENT    Patient Name: Jesse Stewart MRN: [id number] DOB:02/09/1966, 58 y.o., male Today's Date: 01/30/2024  END OF SESSION:  PT End of Session - 01/30/24 0846     Visit Number 11    Number of Visits 15    Date for Recertification  02/18/24    Authorization Type [facility]- 15 visits approved    Authorization - Visit Number 11    Authorization - Number of Visits 15    PT Start Time 0847    PT Stop Time 0931    PT Time Calculation (min) 44 min    Activity Tolerance Patient tolerated treatment well    Behavior During Therapy [facility] for tasks assessed/performed                    Past Medical History:  Diagnosis Date   Arthritis    Hypertension    Lung cancer (HCC) 03/13/2022   Past Surgical History:  Procedure Laterality Date   KNEE ARTHROSCOPY W/ MENISCAL REPAIR     LUNG REMOVAL, PARTIAL Right 03/27/2022   TOTAL KNEE ARTHROPLASTY Left 04/29/2023   Patient Active Problem List   Diagnosis Date Noted   Colon polyps 05/16/2019   Obesity (BMI 30.0-34.9) 05/13/2019   Chest tightness 10/06/2014   COVID 10/06/2014   Hematochezia 12/07/2012   Hernia of abdominal wall 12/07/2012   Well adult exam 04/23/2012   Neoplasm of uncertain behavior of skin 05/04/2010   ACTINIC KERATOSIS 05/04/2010   Dyslipidemia 04/04/2008   GERD 04/04/2008   Osteoarthritis 04/04/2008   KNEE PAIN 04/04/2008   ABNORMAL GLUCOSE NEC 04/04/2008   ABNORMAL LIVER FUNCTION TESTS 04/04/2008   Essential hypertension 12/29/2006    PCP: Garald Karlynn GAILS, MD   REFERRING PROVIDER: Lanell Royals, PA  REFERRING DIAG: Left Adhesive capsulitis  THERAPY DIAG:  Pain in Left shoulder  Rationale for Evaluation and Treatment: Rehabilitation  ONSET DATE: 1 month ago  SUBJECTIVE:   SUBJECTIVE STATEMENT: I think the needling helps more than anything.  Eval: Pt states he received PT for his left shoulder earlier this year, that improved  his mobility and strength. However, recently within past month pt is experiencing a flare up of pain along with significantly decreased left shoulder ROM. Pt returned back to his PCP for a check up. PCP recommended he return back to PT in hopes pt does not require surgery. Pt stated he does not want surgery. He states he has difficulty and pain reaching left arm behind his back and has night pain that wakes him up. He states he sleeps on his left side.    PERTINENT HISTORY: Lt TKA 04/29/23, Lung cancer with lung resection 03/2022 PAIN: 01/28/24:  Are you having pain? Yes: NPRS scale: 0-5/10 Pain location: Anterior shoulder, shoots down  Pain description: sharp, shooting  Aggravating factors: sleep at night, stretching arm Relieving factors: ibuprofen , aloxican hasn't taken, cream doc gave for post op knee   PRECAUTIONS: Other: history of lung cancer   RED FLAGS: None   WEIGHT BEARING RESTRICTIONS: No  FALLS:  Has patient fallen in last 6 months? 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   PLOF: Independent, Vocation/Vocational requirements: see  above, and Leisure: hike, walk, beach   PATIENT GOALS: reach all the way over and get rid of the pain as much as possible  Diagnostic: MRI on 06/27/23:  IMPRESSION:  1. Articular-sided partial tear of the supraspinatus tendon approaching 50% thickness, measuring 2 cm anterior-posterior. Background of tendinosis and surface fraying.  2. Subscapularis tendinosis and low-grade partial tear.  3. Intra-articular biceps tendinosis, possible intrasubstance partial tear near the biceps-labral complex.  4. Imaging findings which may be seen  with adhesive capsulitis.  5. Moderate AC joint arthrosis.   NEXT MD VISIT: November 2025  OBJECTIVE:  Note: Objective measures were completed at Evaluation unless otherwise noted.  COGNITION: Overall cognitive status: Within functional limits for tasks assessed     SENSATION: WFL   POSTURE: Rounded shoulders and forward head posture  PALPATION: Muscle tightness in bilateral upper traps, rotator cuff muscles on left side  joint hypomobility with A-P glides, and inferior glides to left shoulder Decreased scapular mobility on left side UPPER EXTREMITY ROM:  AROM Left  Left  8/20 post manual (seated) Lt  01/07/24 Lt Lt  01/15/24  Flexion  128 134 130- reduced scapular elevation 135 sitting 146 supine Sitting 135  Abduction* 90 95 105    IR Lacking 8 inches vs Rt   Lacking 6 inch vs Rt (post stretches) about L5 Lacking 6 inches vs Rt   ER Lacking 6 inches vs Rt       PROM   Left: Flexion=116, Abduction*=80    (Blank rows = not tested)  UPPER EXTREMITY MMT:  MMT Right eval Left eval Left 01/23/24  Shoulder flexion   4 4 4+  Shoulder Abd* 4 4- 4+  ER 4 4- 4+  IR 4 4- 5  Extension 4 4- 5                                      (Blank rows = not tested)                                                                                                                    TREATMENT DATE:  01/30/24 Arm bike: level 2x 8 min (4/4)-PT present to discuss progress  Finger ladder with 2# added to wrist: flexion and scaption x10 each  Standing IR with dowel behind back horizontally x 10 cues for upright posture - also discussed other options using objects to maintain position  prone shoulder ext 10#, rows (10#) and horizontal abduction pain!(3#) x 10 each left Side lying ER, flex and ABD with 3 lbs  x 10 left PA and inferior mobs to L shoulder cause pain Side lying T and Y x 10 no pain  Trigger Point Dry Needling  Subsequent Treatment: Instructions provided previously at  initial dry needling treatment.   Patient Verbal Consent Given: Yes Education Handout Provided: Previously Provided Muscles Treated: L UT,  infraspinatus, teres, lats, subscapularis Electrical Stimulation  Performed: No Treatment Response/Outcome: Utilized skilled palpation to identify bony landmarks and trigger points.  Able to illicit twitch response and muscle elongation.  Soft tissue mobilization to muscles needled following DN to further promote tissue elongation and decreased pain.            01/28/24 Arm bike: level 2x 8 min (4/4)-PT present to discuss progress  Finger ladder with 1.5# added to wrist: flexion and scaption x10 each  Ball roll outs with overpressure hold x 10- 3 ways  Standing IR with dowel behind back horizontally x 10 cues for upright posture  prone shoulder ext, rows and horizontal abduction x 20 each with 3 lb left Side lying ER, flex and ABD with 3 lbs 2 x 10 left Manual: P/ROM and joint mobs to Lt shoulder in all directions     01/23/24 Finger ladder with 1# added to wrist: flexion and scaption x 30 each  Standing IR with dowel behind back horizontally x 10 cues for upright posture MMT Trigger Point Dry Needling  Subsequent Treatment: Instructions provided previously at initial dry needling treatment.   Patient Verbal Consent Given: Yes Education Handout Provided: Previously Provided Muscles Treated: L UT,  infraspinatus, teres, lats, deltoids and pecs Electrical Stimulation Performed: No Treatment Response/Outcome: Utilized skilled palpation to identify bony landmarks and trigger points.  Able to illicit twitch response and muscle elongation.  Soft tissue mobilization to muscles needled following DN to further promote tissue elongation and decreased pain.      01/21/24 Arm bike: level 2x 8 min (4/4)-PT present to discuss progress  Finger ladder with 1# added to wrist: flexion and scaption x10 each   OH flexion stretch with dowel in 90/90 x 10 SL  bent arm horizontal Adduction passive stretch with scapula stabilized by PT (down toward table) Standing IR with dowel behind back horizontally x 10 cues for upright posture  prone shoulder ext, rows and horizontal abduction x 20 each with 3 lb left Side lying ER, flex and ABD with 3 lbs 2 x 10 left Manual: P/ROM and joint mobs to Lt shoulder in all directions     PATIENT EDUCATION:  Education details: Access Code: [email or web address] Person educated: Patient Education method: Explanation, Demonstration, and Handouts Education comprehension: verbalized understanding and returned demonstration  HOME EXERCISE PROGRAM:  Access Code: [email or web address] URL: [email or web address] Date: 01/09/2024 Prepared by: Mliss  Exercises - Seated Shoulder Flexion AAROM with Pulley Behind  - 3 x daily - 7 x weekly - 3 sets - 10 reps - Seated Shoulder Scaption AAROM with Pulley at Side  - 3 x daily - 7 x weekly - 3 sets - 10 reps - Seated Shoulder Internal Rotation AAROM with Pulley  - 1 x daily - 7 x weekly - 3 sets - 10 reps - Shoulder Flexion Wall Slide with Towel  - 2 x daily - 7 x weekly - 1 sets - 10 reps - 10 hold - Standing Shoulder Abduction Slides at Wall  - 1 x daily - 7 x weekly - 1 sets - 10 reps - 10 hold - Supine Shoulder External Rotation with Dowel  - 2 x daily - 7 x weekly - 2 sets - 10 reps - Standing Sleeper Stretch at [employer]  - 1 x daily - 3 x weekly - 1 sets - 3 reps - 30 hold - Sleeper Stretch  - 2 x daily - 7 x weekly - 1 sets - 3 reps - 30-60 sec hold - Sidelying Posterior Cuff Cross  Body Stretch - 1/4 Turn Back  - 1 x daily - 7 x weekly - 1 sets - 5 reps - 10 sec hold - Prone Shoulder Extension - Single Arm  - 2 x daily - 7 x weekly - 2 sets - 10 reps - Prone Shoulder Row  - 2 x daily - 7 x weekly - 2 sets - 10 reps - Prone Single Arm Shoulder Horizontal Abduction with Scapular Retraction and Palm Down  - 2 x daily - 7 x weekly - 2 sets - 10 reps - Sidelying Shoulder External Rotation   - 2 x daily - 7 x weekly - 2 sets - 10 reps - Single Arm Serratus Punches in Supine with Dumbbell  - 2 x daily - 7 x weekly - 2 sets - 10 reps - Standing Bilateral Shoulder Internal Rotation AAROM with Dowel  - 2 x daily - 7 x weekly - 2 sets - 10 reps - Sidelying Open Book  - 2 x daily - 7 x weekly - 1 sets - 10 reps - 5 sec hold   ASSESSMENT:  CLINICAL IMPRESSION: Patient continues to report improvements. He reported pain with prone T today and also has pain with prone Y. He also had pain with assessment of PA and inferior G/H mobs gd II. Patient has no pain with these in SL. Advised SL T and Y to keep humeral head posterior while strengthening posterior muscles. Also told him he could try prone exercises with a towel under the humeral head to prevent anterior translation. Excellent twitch response sto DN in UT and subscapularis today.    OBJECTIVE IMPAIRMENTS: Muscle spasm, decreased shoulder mobility, decreased shoulder strength, postural dysfunction, left shoulder pain, decreased functional capacity, pain with reaching behind back,   ACTIVITY LIMITATIONS: Reaching over head, abducting left shoulder beyond 90 deg, reaching behind back, sleeping  PARTICIPATION LIMITATIONS: cleaning, driving, community activity, occupation, and yard work  PERSONAL FACTORS: 1-2 comorbidities: Lt TKA, lung cancer  are also affecting patient's functional outcome.   REHAB POTENTIAL: Good  CLINICAL DECISION MAKING: Stable/uncomplicated  EVALUATION COMPLEXITY: Low   GOALS: Goals reviewed with patient? Yes  SHORT TERM GOALS: Target date: 01/23/2024   Be independent in initial HEP Baseline: Goal status: MET  2.  Demonstrate Lt shouler A/ROM flexion to > or = to 140 degrees to allow for reaching overhead to paint and put dishes away in cabinets.  Baseline: 128 Goal status: INITIAL  3.  Improve Lt shoulder A/ROM IR to lacking < or = to 5 inches vs the Rt to improve self-care Baseline: 6 difference  (01/14/24) Goal status: In progress   4.   Improve strength 4+/5 to lift and carry objects overhead pain free.  Baseline: 4-, no pain with lifting (01/21/24) Goal status: partially MET 9/12 (4+ to 5/5)    LONG TERM GOALS: Target date: 02/18/2024     Be independent in advanced HEP Baseline:  Goal status: In progress   2.  Improve Lt shoulder A/ROM ER to lacking < or = to 3 inches vs the Rt to improve self-care Baseline: 6 inches (01/15/24) Goal status: In progress   3.  Demonstrate Lt Shouler A/ROM flexion to > or = to 150 degrees to allow reaching overhead to paint without compensation  Baseline: 135 (01/15/24) Goal status: In progress   4.  Demonstrate symmetry with bilateral AROM Abduction without compensation due to increased strength and ROM Baseline:  Goal status: INITIAL  5.  Improve strength in left  shoulder to complete house work such as fixing overhead lights, paint, etc with > or = to 50% less Lt UE pain Baseline: 4-  and 3/10 pain Goal status: MET  01/23/24  6.  Reach behind back WNL to be able to reach for seat behind while driving, reach for wallet in back pocket.  Baseline: lacking 8 inches Goal status: INITIAL   PLAN:  PT FREQUENCY: 2x/week  PT DURATION: 8 weeks  PLANNED INTERVENTIONS: 97110-Therapeutic exercises, 97530- Therapeutic activity, 97112- Neuromuscular re-education, 97535- Self Care, [id number]- Manual therapy, [phone]- Gait training, [phone]- Aquatic Therapy, 97014- Electrical stimulation (unattended), [phone]- Electrical stimulation (manual), 97016- Vasopneumatic device, 97035- Ultrasound, Patient/Family education, Balance training, Taping, Joint mobilization, Joint manipulation, Scar mobilization, Vestibular training, and Cryotherapy  PLAN FOR NEXT SESSION:  Assess SL T and Y strengthening, Lt shoulder ROM, strength, manual for ROM gains,  postural strength   Mliss Cummins, PT  01/30/24 9:41 AM   [facility] [address]  [address] [place], [place] [address] Phone # [phone] Fax [phone]

## 2024-01-30 ENCOUNTER — Encounter: Payer: Self-pay | Admitting: Physical Therapy

## 2024-01-30 ENCOUNTER — Ambulatory Visit: Admitting: Physical Therapy

## 2024-01-30 DIAGNOSIS — M25512 Pain in left shoulder: Secondary | ICD-10-CM | POA: Diagnosis not present

## 2024-01-30 DIAGNOSIS — G8929 Other chronic pain: Secondary | ICD-10-CM

## 2024-01-30 DIAGNOSIS — R293 Abnormal posture: Secondary | ICD-10-CM

## 2024-01-30 DIAGNOSIS — R252 Cramp and spasm: Secondary | ICD-10-CM

## 2024-01-30 DIAGNOSIS — M25612 Stiffness of left shoulder, not elsewhere classified: Secondary | ICD-10-CM

## 2024-01-30 DIAGNOSIS — M6281 Muscle weakness (generalized): Secondary | ICD-10-CM

## 2024-02-04 ENCOUNTER — Ambulatory Visit

## 2024-02-04 DIAGNOSIS — M25612 Stiffness of left shoulder, not elsewhere classified: Secondary | ICD-10-CM

## 2024-02-04 DIAGNOSIS — G8929 Other chronic pain: Secondary | ICD-10-CM

## 2024-02-04 DIAGNOSIS — R293 Abnormal posture: Secondary | ICD-10-CM

## 2024-02-04 DIAGNOSIS — M6281 Muscle weakness (generalized): Secondary | ICD-10-CM

## 2024-02-04 DIAGNOSIS — R252 Cramp and spasm: Secondary | ICD-10-CM

## 2024-02-04 DIAGNOSIS — M25512 Pain in left shoulder: Secondary | ICD-10-CM | POA: Diagnosis not present

## 2024-02-04 NOTE — Therapy (Signed)
 OUTPATIENT PHYSICAL THERAPY UPPER EXTREMITY TREATMENT    Patient Name: Jesse Stewart MRN: [id number] DOB:07-Jun-1965, 58 y.o., male Today's Date: 02/04/2024  END OF SESSION:  PT End of Session - 02/04/24 0932     Visit Number 12    Date for Recertification  02/18/24    Authorization Type [facility]- 15 visits approved    Authorization - Visit Number 12    Authorization - Number of Visits 15    PT Start Time 0847    PT Stop Time 0930    PT Time Calculation (min) 43 min    Activity Tolerance Patient tolerated treatment well    Behavior During Therapy [facility] for tasks assessed/performed                     Past Medical History:  Diagnosis Date   Arthritis    Hypertension    Lung cancer (HCC) 03/13/2022   Past Surgical History:  Procedure Laterality Date   KNEE ARTHROSCOPY W/ MENISCAL REPAIR     LUNG REMOVAL, PARTIAL Right 03/27/2022   TOTAL KNEE ARTHROPLASTY Left 04/29/2023   Patient Active Problem List   Diagnosis Date Noted   Colon polyps 05/16/2019   Obesity (BMI 30.0-34.9) 05/13/2019   Chest tightness 10/06/2014   COVID 10/06/2014   Hematochezia 12/07/2012   Hernia of abdominal wall 12/07/2012   Well adult exam 04/23/2012   Neoplasm of uncertain behavior of skin 05/04/2010   ACTINIC KERATOSIS 05/04/2010   Dyslipidemia 04/04/2008   GERD 04/04/2008   Osteoarthritis 04/04/2008   KNEE PAIN 04/04/2008   ABNORMAL GLUCOSE NEC 04/04/2008   ABNORMAL LIVER FUNCTION TESTS 04/04/2008   Essential hypertension 12/29/2006    PCP: Garald Karlynn GAILS, MD   REFERRING PROVIDER: Lanell Royals, PA  REFERRING DIAG: Left Adhesive capsulitis  THERAPY DIAG:  Pain in Left shoulder  Rationale for Evaluation and Treatment: Rehabilitation  ONSET DATE: 1 month ago  SUBJECTIVE:   SUBJECTIVE STATEMENT: I've been doing my exercises and doing the Y and T in sidelying at home.  75% overall improvement.   Eval: Pt states he received PT for his left shoulder  earlier this year, that improved his mobility and strength. However, recently within past month pt is experiencing a flare up of pain along with significantly decreased left shoulder ROM. Pt returned back to his PCP for a check up. PCP recommended he return back to PT in hopes pt does not require surgery. Pt stated he does not want surgery. He states he has difficulty and pain reaching left arm behind his back and has night pain that wakes him up. He states he sleeps on his left side.    PERTINENT HISTORY: Lt TKA 04/29/23, Lung cancer with lung resection 03/2022 PAIN: 02/04/24:  Are you having pain? Yes: NPRS scale: 0-/10 Pain location: Anterior shoulder, shoots down  Pain description: sharp, shooting  Aggravating factors: sleep at night, stretching arm Relieving factors: ibuprofen , aloxican hasn't taken, cream doc gave for post op knee   PRECAUTIONS: Other: history of lung cancer   RED FLAGS: None   WEIGHT BEARING RESTRICTIONS: No  FALLS:  Has patient fallen in last 6 months? 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  PLOF: Independent, Vocation/Vocational requirements: see above, and Leisure: hike, walk, beach   PATIENT GOALS: reach all the way over and get rid of the pain as much as possible  Diagnostic: MRI on 06/27/23:  IMPRESSION:  1. Articular-sided partial tear of the supraspinatus tendon approaching 50% thickness, measuring 2 cm anterior-posterior. Background of tendinosis and surface fraying.  2. Subscapularis tendinosis and low-grade partial tear.  3. Intra-articular biceps tendinosis, possible intrasubstance partial tear near the biceps-labral complex.  4.  Imaging findings which may be seen with adhesive capsulitis.  5. Moderate AC joint arthrosis.   NEXT MD VISIT: November 2025  OBJECTIVE:  Note: Objective measures were completed at Evaluation unless otherwise noted.  COGNITION: Overall cognitive status: Within functional limits for tasks assessed     SENSATION: WFL   POSTURE: Rounded shoulders and forward head posture  PALPATION: Muscle tightness in bilateral upper traps, rotator cuff muscles on left side  joint hypomobility with A-P glides, and inferior glides to left shoulder Decreased scapular mobility on left side UPPER EXTREMITY ROM:  AROM Left  Left  8/20 post manual (seated) Lt  01/07/24 Lt Lt  01/15/24  Flexion  128 134 130- reduced scapular elevation 135 sitting 146 supine Sitting 135  Abduction* 90 95 105    IR Lacking 8 inches vs Rt   Lacking 6 inch vs Rt (post stretches) about L5 Lacking 6 inches vs Rt   ER Lacking 6 inches vs Rt       PROM   Left: Flexion=116, Abduction*=80    (Blank rows = not tested)  UPPER EXTREMITY MMT:  MMT Right eval Left eval Left 01/23/24  Shoulder flexion   4 4 4+  Shoulder Abd* 4 4- 4+  ER 4 4- 4+  IR 4 4- 5  Extension 4 4- 5                                      (Blank rows = not tested)                                                                                                                    TREATMENT DATE:   02/04/24 Arm bike: level 2x 8 min (4/4)-PT present to discuss progress  Finger ladder with 2# added to wrist: flexion and scaption x10 each  Lat pull down: 40# x20 Row: 20# x20 Standing IR with dowel behind back horizontally x 10 cues for upright posture - also discussed other options using objects to maintain position  prone shoulder ext 10#, rows (10#) 2x20 Side lying ER, flex and ABD with 3 lbs  2x 10 left Side lying T and Y x 10 no pain Manual: P/ROM and joint mobs to Lt shoulder in all directions   01/30/24 Arm bike: level 2x 8 min (4/4)-PT  present to discuss progress  Finger ladder with 2# added to wrist: flexion and scaption x10 each  Standing IR  with dowel behind back horizontally x 10 cues for upright posture - also discussed other options using objects to maintain position  prone shoulder ext 10#, rows (10#) and horizontal abduction pain!(3#) x 10 each left Side lying ER, flex and ABD with 3 lbs  x 10 left PA and inferior mobs to L shoulder cause pain Side lying T and Y x 10 no pain  Trigger Point Dry Needling  Subsequent Treatment: Instructions provided previously at initial dry needling treatment.   Patient Verbal Consent Given: Yes Education Handout Provided: Previously Provided Muscles Treated: L UT,  infraspinatus, teres, lats, subscapularis Electrical Stimulation Performed: No Treatment Response/Outcome: Utilized skilled palpation to identify bony landmarks and trigger points.  Able to illicit twitch response and muscle elongation.  Soft tissue mobilization to muscles needled following DN to further promote tissue elongation and decreased pain.      01/28/24 Arm bike: level 2x 8 min (4/4)-PT present to discuss progress  Finger ladder with 1.5# added to wrist: flexion and scaption x10 each  Ball roll outs with overpressure hold x 10- 3 ways  Standing IR with dowel behind back horizontally x 10 cues for upright posture  prone shoulder ext, rows and horizontal abduction x 20 each with 3 lb left Side lying ER, flex and ABD with 3 lbs 2 x 10 left Manual: P/ROM and joint mobs to Lt shoulder in all directions       PATIENT EDUCATION:  Education details: Access Code: [email or web address] Person educated: Patient Education method: Explanation, Demonstration, and Handouts Education comprehension: verbalized understanding and returned demonstration  HOME EXERCISE PROGRAM:  Access Code: [email or web address] URL: [email or web address] Date: 01/09/2024 Prepared by: Mliss  Exercises - Seated Shoulder Flexion AAROM with  Pulley Behind  - 3 x daily - 7 x weekly - 3 sets - 10 reps - Seated Shoulder Scaption AAROM with Pulley at Side  - 3 x daily - 7 x weekly - 3 sets - 10 reps - Seated Shoulder Internal Rotation AAROM with Pulley  - 1 x daily - 7 x weekly - 3 sets - 10 reps - Shoulder Flexion Wall Slide with Towel  - 2 x daily - 7 x weekly - 1 sets - 10 reps - 10 hold - Standing Shoulder Abduction Slides at Wall  - 1 x daily - 7 x weekly - 1 sets - 10 reps - 10 hold - Supine Shoulder External Rotation with Dowel  - 2 x daily - 7 x weekly - 2 sets - 10 reps - Standing Sleeper Stretch at [employer]  - 1 x daily - 3 x weekly - 1 sets - 3 reps - 30 hold - Sleeper Stretch  - 2 x daily - 7 x weekly - 1 sets - 3 reps - 30-60 sec hold - Sidelying Posterior Cuff Cross Body Stretch - 1/4 Turn Back  - 1 x daily - 7 x weekly - 1 sets - 5 reps - 10 sec hold - Prone Shoulder Extension - Single Arm  - 2 x daily - 7 x weekly - 2 sets - 10 reps - Prone Shoulder Row  - 2 x daily - 7 x weekly - 2 sets - 10 reps - Prone Single Arm Shoulder Horizontal Abduction with Scapular Retraction and Palm Down  - 2 x daily - 7 x weekly - 2 sets - 10 reps - Sidelying Shoulder External Rotation  - 2 x daily - 7 x weekly - 2 sets - 10 reps - Single Arm Serratus Punches  in Supine with Dumbbell  - 2 x daily - 7 x weekly - 2 sets - 10 reps - Standing Bilateral Shoulder Internal Rotation AAROM with Dowel  - 2 x daily - 7 x weekly - 2 sets - 10 reps - Sidelying Open Book  - 2 x daily - 7 x weekly - 1 sets - 10 reps - 5 sec hold   ASSESSMENT:  CLINICAL IMPRESSION: Pt reports 75% overall reduction in Lt shoulder pain and function since the start of care.  He only has pain with bending forward and reaching with Lt UE. He has modified prone exercises to side lying to reduce pain.  Pt continues to stretch at home and reports improved ease with movement. He did well with advancement of weights today.  PT monitored for technique and pain.  Patient will benefit from  skilled PT to address the below impairments and improve overall function.   OBJECTIVE IMPAIRMENTS: Muscle spasm, decreased shoulder mobility, decreased shoulder strength, postural dysfunction, left shoulder pain, decreased functional capacity, pain with reaching behind back,   ACTIVITY LIMITATIONS: Reaching over head, abducting left shoulder beyond 90 deg, reaching behind back, sleeping  PARTICIPATION LIMITATIONS: cleaning, driving, community activity, occupation, and yard work  PERSONAL FACTORS: 1-2 comorbidities: Lt TKA, lung cancer  are also affecting patient's functional outcome.   REHAB POTENTIAL: Good  CLINICAL DECISION MAKING: Stable/uncomplicated  EVALUATION COMPLEXITY: Low   GOALS: Goals reviewed with patient? Yes  SHORT TERM GOALS: Target date: 01/23/2024   Be independent in initial HEP Baseline: Goal status: MET  2.  Demonstrate Lt shouler A/ROM flexion to > or = to 140 degrees to allow for reaching overhead to paint and put dishes away in cabinets.  Baseline: 128 Goal status: INITIAL  3.  Improve Lt shoulder A/ROM IR to lacking < or = to 5 inches vs the Rt to improve self-care Baseline: 6 difference (01/14/24) Goal status: In progress   4.   Improve strength 4+/5 to lift and carry objects overhead pain free.  Baseline: 4-, no pain with lifting (01/21/24) Goal status: partially MET 9/12 (4+ to 5/5)    LONG TERM GOALS: Target date: 02/18/2024     Be independent in advanced HEP Baseline:  Goal status: In progress   2.  Improve Lt shoulder A/ROM ER to lacking < or = to 3 inches vs the Rt to improve self-care Baseline: 6 inches (01/15/24) Goal status: In progress   3.  Demonstrate Lt Shouler A/ROM flexion to > or = to 150 degrees to allow reaching overhead to paint without compensation  Baseline: 135 (01/15/24) Goal status: In progress   4.  Demonstrate symmetry with bilateral AROM Abduction without compensation due to increased strength and ROM Baseline:   Goal status: INITIAL  5.  Improve strength in left shoulder to complete house work such as fixing overhead lights, paint, etc with > or = to 50% less Lt UE pain Baseline: 4-  and 3/10 pain Goal status: MET  01/23/24  6.  Reach behind back WNL to be able to reach for seat behind while driving, reach for wallet in back pocket.  Baseline: lacking 8 inches Goal status: INITIAL   PLAN:  PT FREQUENCY: 2x/week  PT DURATION: 8 weeks  PLANNED INTERVENTIONS: 97110-Therapeutic exercises, 97530- Therapeutic activity, [id number]- Neuromuscular re-education, 97535- Self Care, [id number]- Manual therapy, [id number]- Gait training, [phone]- Aquatic Therapy, [id number]- Electrical stimulation (unattended), [id number]- Electrical stimulation (manual), [id number]- Vasopneumatic device, [id number]- Ultrasound, Patient/Family education, Balance training, Taping, Joint mobilization, Joint  manipulation, Scar mobilization, Vestibular training, and Cryotherapy  PLAN FOR NEXT SESSION:  Assess SL T and Y strengthening, Lt shoulder ROM, strength, manual for ROM gains,  postural strength   Burnard Joy, PT 02/04/24 9:32 AM   [facility] [address], [address] [place], [place] [address] Phone # [phone] Fax [phone]

## 2024-02-05 NOTE — Therapy (Signed)
 OUTPATIENT PHYSICAL THERAPY UPPER EXTREMITY TREATMENT    Patient Name: Jesse Stewart MRN: [id number] DOB:1965/06/18, 58 y.o., male Today's Date: 02/06/2024  END OF SESSION:  PT End of Session - 02/06/24 0928     Visit Number 13    Number of Visits 15    Date for Recertification  02/18/24    Authorization Type [facility]- 15 visits approved    Authorization - Visit Number 13    Authorization - Number of Visits 15    PT Start Time 0849    PT Stop Time 0929    PT Time Calculation (min) 40 min    Activity Tolerance Patient tolerated treatment well    Behavior During Therapy [facility] for tasks assessed/performed                      Past Medical History:  Diagnosis Date   Arthritis    Hypertension    Lung cancer (HCC) 03/13/2022   Past Surgical History:  Procedure Laterality Date   KNEE ARTHROSCOPY W/ MENISCAL REPAIR     LUNG REMOVAL, PARTIAL Right 03/27/2022   TOTAL KNEE ARTHROPLASTY Left 04/29/2023   Patient Active Problem List   Diagnosis Date Noted   Colon polyps 05/16/2019   Obesity (BMI 30.0-34.9) 05/13/2019   Chest tightness 10/06/2014   COVID 10/06/2014   Hematochezia 12/07/2012   Hernia of abdominal wall 12/07/2012   Well adult exam 04/23/2012   Neoplasm of uncertain behavior of skin 05/04/2010   ACTINIC KERATOSIS 05/04/2010   Dyslipidemia 04/04/2008   GERD 04/04/2008   Osteoarthritis 04/04/2008   KNEE PAIN 04/04/2008   ABNORMAL GLUCOSE NEC 04/04/2008   ABNORMAL LIVER FUNCTION TESTS 04/04/2008   Essential hypertension 12/29/2006    PCP: Garald Karlynn GAILS, MD   REFERRING PROVIDER: Lanell Royals, PA  REFERRING DIAG: Left Adhesive capsulitis  THERAPY DIAG:  Pain in Left shoulder  Rationale for Evaluation and Treatment: Rehabilitation  ONSET DATE: 1 month ago  SUBJECTIVE:   SUBJECTIVE STATEMENT: Still just mainly have pain with reaching out to the side (ABD)  Eval: Pt states he received PT for his left shoulder earlier  this year, that improved his mobility and strength. However, recently within past month pt is experiencing a flare up of pain along with significantly decreased left shoulder ROM. Pt returned back to his PCP for a check up. PCP recommended he return back to PT in hopes pt does not require surgery. Pt stated he does not want surgery. He states he has difficulty and pain reaching left arm behind his back and has night pain that wakes him up. He states he sleeps on his left side.    PERTINENT HISTORY: Lt TKA 04/29/23, Lung cancer with lung resection 03/2022 PAIN: 02/04/24:  Are you having pain? Yes: NPRS scale: 0-/10 Pain location: Anterior shoulder, shoots down  Pain description: sharp, shooting  Aggravating factors: sleep at night, stretching arm Relieving factors: ibuprofen , aloxican hasn't taken, cream doc gave for post op knee   PRECAUTIONS: Other: history of lung cancer   RED FLAGS: None   WEIGHT BEARING RESTRICTIONS: No  FALLS:  Has patient fallen in last 6 months? 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  PLOF: Independent, Vocation/Vocational requirements: see above, and Leisure: hike, walk, beach   PATIENT GOALS: reach all the way over and get rid of the pain as much as possible  Diagnostic: MRI on 06/27/23:  IMPRESSION:  1. Articular-sided partial tear of the supraspinatus tendon approaching 50% thickness, measuring 2 cm anterior-posterior. Background of tendinosis and surface fraying.  2. Subscapularis tendinosis and low-grade partial tear.  3. Intra-articular biceps tendinosis, possible intrasubstance partial tear near the biceps-labral complex.  4. Imaging  findings which may be seen with adhesive capsulitis.  5. Moderate AC joint arthrosis.   NEXT MD VISIT: November 2025  OBJECTIVE:  Note: Objective measures were completed at Evaluation unless otherwise noted.  COGNITION: Overall cognitive status: Within functional limits for tasks assessed     SENSATION: WFL   POSTURE: Rounded shoulders and forward head posture  PALPATION: Muscle tightness in bilateral upper traps, rotator cuff muscles on left side  joint hypomobility with A-P glides, and inferior glides to left shoulder Decreased scapular mobility on left side UPPER EXTREMITY ROM:  AROM Left  Left  8/20 post manual (seated) Lt  01/07/24 Lt Lt  01/15/24  Flexion  128 134 130- reduced scapular elevation 135 sitting 146 supine Sitting 135  Abduction* 90 95 105    IR Lacking 8 inches vs Rt   Lacking 6 inch vs Rt (post stretches) about L5 Lacking 6 inches vs Rt   ER Lacking 6 inches vs Rt       AROM Left  02/06/24  Flexion  Sitting 130 Supine 150  Abduction*   IR Lacks 7 inch compared to R  ER     PROM   Left: Flexion=116, Abduction*=80    (Blank rows = not tested)  UPPER EXTREMITY MMT:  MMT Right eval Left eval Left 01/23/24 Left 02/06/24  Shoulder flexion   4 4 4+ 5-  Shoulder Abd* 4 4- 4+ 4+  ER 4 4- 4+ 5  IR 4 4- 5   Extension 4 4- 5                                              (Blank rows = not tested)                                                                                                                    TREATMENT DATE:  02/06/24 Arm bike: level 3x 8 min (4/4)-PT present to discuss progress  Finger ladder with 3# added to wrist: flexion and scaption x10 each  Lat pull down: 40# x20 Row: 20# x20 Standing IR with dowel behind back horizontally x 10 cues for upright posture Side lying ER 3 sec hold and slow lower,  flex and ABD with 3 lbs  2x 10 left - demos some scapular dyskinesia with ADDuction of shoulder Side lying T and Y 3# 2 x 10  no pain  3-way reach yellow loop x 10 L side Wall push ups x10, counter push ups x 10 Quadruped thoracic rotation for stabilization through L shoulder x 10  Quadruped left shoulder slide outs into flexion x 5 - stopped due to ROM restrictions ROM and MMT   02/04/24 Arm bike: level 2x 8 min (4/4)-PT present to discuss progress  Finger ladder with 2# added to wrist: flexion and scaption x10 each  Lat pull down: 40# x20 Row: 20# x20 Standing IR with dowel behind back horizontally x 10 cues for upright posture - also discussed other options using objects to maintain position  prone shoulder ext 10#, rows (10#) 2x20 Side lying ER, flex and ABD with 3 lbs  2x 10 left Side lying T and Y x 10 no pain Manual: P/ROM and joint mobs to Lt shoulder in all directions   01/30/24 Arm bike: level 2x 8 min (4/4)-PT present to discuss progress  Finger ladder with 2# added to wrist: flexion and scaption x10 each  Standing IR with dowel behind back horizontally x 10 cues for upright posture - also discussed other options using objects to maintain position  prone shoulder ext 10#, rows (10#) and horizontal abduction pain!(3#) x 10 each left Side lying ER, flex and ABD with 3 lbs  x 10 left PA and inferior mobs to L shoulder cause pain Side lying T and Y x 10 no pain  Trigger Point Dry Needling  Subsequent Treatment: Instructions provided previously at initial dry needling treatment.   Patient Verbal Consent Given: Yes Education Handout Provided: Previously Provided Muscles Treated: L UT,  infraspinatus, teres, lats, subscapularis Electrical Stimulation Performed: No Treatment Response/Outcome: Utilized skilled palpation to identify bony landmarks and trigger points.  Able to illicit twitch response and muscle elongation.  Soft tissue mobilization to muscles needled following DN to further promote tissue elongation and decreased pain.      01/28/24 Arm bike: level 2x 8 min (4/4)-PT present to discuss  progress  Finger ladder with 1.5# added to wrist: flexion and scaption x10 each  Ball roll outs with overpressure hold x 10- 3 ways  Standing IR with dowel behind back horizontally x 10 cues for upright posture  prone shoulder ext, rows and horizontal abduction x 20 each with 3 lb left Side lying ER, flex and ABD with 3 lbs 2 x 10 left Manual: P/ROM and joint mobs to Lt shoulder in all directions       PATIENT EDUCATION:  Education details: Access Code: [email or web address] Person educated: Patient Education method: Explanation, Demonstration, and Handouts Education comprehension: verbalized understanding and returned demonstration  HOME EXERCISE PROGRAM:  Access Code: [email or web address] URL: [email or web address] Date: 01/09/2024 Prepared by: Mliss  Exercises - Seated Shoulder Flexion AAROM with Pulley Behind  - 3 x daily - 7 x weekly - 3 sets - 10 reps - Seated Shoulder Scaption AAROM with Pulley at Side  - 3 x daily - 7 x weekly - 3 sets - 10 reps - Seated Shoulder Internal Rotation AAROM with Pulley  - 1 x daily - 7 x weekly - 3 sets - 10 reps - Shoulder Flexion Wall Slide with Towel  - 2 x daily - 7 x weekly - 1 sets - 10 reps - 10 hold - Standing Shoulder Abduction Slides at Wall  - 1 x daily - 7 x weekly - 1 sets - 10 reps - 10 hold - Supine Shoulder External Rotation with Dowel  - 2 x daily - 7 x weekly -  2 sets - 10 reps - Standing Sleeper Stretch at [employer]  - 1 x daily - 3 x weekly - 1 sets - 3 reps - 30 hold - Sleeper Stretch  - 2 x daily - 7 x weekly - 1 sets - 3 reps - 30-60 sec hold - Sidelying Posterior Cuff Cross Body Stretch - 1/4 Turn Back  - 1 x daily - 7 x weekly - 1 sets - 5 reps - 10 sec hold - Prone Shoulder Extension - Single Arm  - 2 x daily - 7 x weekly - 2 sets - 10 reps - Prone Shoulder Row  - 2 x daily - 7 x weekly - 2 sets - 10 reps - Prone Single Arm Shoulder Horizontal Abduction with Scapular Retraction and Palm Down  - 2 x daily - 7 x weekly - 2 sets - 10  reps - Sidelying Shoulder External Rotation  - 2 x daily - 7 x weekly - 2 sets - 10 reps - Single Arm Serratus Punches in Supine with Dumbbell  - 2 x daily - 7 x weekly - 2 sets - 10 reps - Standing Bilateral Shoulder Internal Rotation AAROM with Dowel  - 2 x daily - 7 x weekly - 2 sets - 10 reps - Sidelying Open Book  - 2 x daily - 7 x weekly - 1 sets - 10 reps - 5 sec hold   ASSESSMENT:  CLINICAL IMPRESSION:  Patient has improved with strength in flexion and ER. He is still shaky with resistance, but able to maintain resistance without release. ABD is still 4+/5 and slightly painful. He demonstrates some scapular instability with shoulder ADDuction in S/L so we added some weight bearing stabilization exercises. He may benefit from plank shoulder taps next visit as well. He has also improved his flexion in supine to 150 degrees, but functional ROM is still in the 130's likely due to lat tightness.  OBJECTIVE IMPAIRMENTS: Muscle spasm, decreased shoulder mobility, decreased shoulder strength, postural dysfunction, left shoulder pain, decreased functional capacity, pain with reaching behind back,   ACTIVITY LIMITATIONS: Reaching over head, abducting left shoulder beyond 90 deg, reaching behind back, sleeping  PARTICIPATION LIMITATIONS: cleaning, driving, community activity, occupation, and yard work  PERSONAL FACTORS: 1-2 comorbidities: Lt TKA, lung cancer  are also affecting patient's functional outcome.   REHAB POTENTIAL: Good  CLINICAL DECISION MAKING: Stable/uncomplicated  EVALUATION COMPLEXITY: Low   GOALS: Goals reviewed with patient? Yes  SHORT TERM GOALS: Target date: 01/23/2024   Be independent in initial HEP Baseline: Goal status: MET  2.  Demonstrate Lt shouler A/ROM flexion to > or = to 140 degrees to allow for reaching overhead to paint and put dishes away in cabinets.  Baseline: 128 Goal status: INITIAL  3.  Improve Lt shoulder A/ROM IR to lacking < or = to 5  inches vs the Rt to improve self-care Baseline: 6 difference (01/14/24) Goal status: In progress   4.   Improve strength 4+/5 to lift and carry objects overhead pain free.  Baseline: 4-, no pain with lifting (01/21/24) Goal status: partially MET 9/12 (4+ to 5/5)    LONG TERM GOALS: Target date: 02/18/2024     Be independent in advanced HEP Baseline:  Goal status: In progress   2.  Improve Lt shoulder A/ROM ER to lacking < or = to 3 inches vs the Rt to improve self-care Baseline: 6 inches (01/15/24) Goal status: In progress   3.  Demonstrate Lt Shouler A/ROM flexion  to > or = to 150 degrees to allow reaching overhead to paint without compensation  Baseline: 135 (01/15/24) Goal status: In progress   4.  Demonstrate symmetry with bilateral AROM Abduction without compensation due to increased strength and ROM Baseline:  Goal status: INITIAL  5.  Improve strength in left shoulder to complete house work such as fixing overhead lights, paint, etc with > or = to 50% less Lt UE pain Baseline: 4-  and 3/10 pain Goal status: MET  01/23/24  6.  Reach behind back WNL to be able to reach for seat behind while driving, reach for wallet in back pocket.  Baseline: lacking 8 inches Goal status: INITIAL   PLAN:  PT FREQUENCY: 2x/week  PT DURATION: 8 weeks  PLANNED INTERVENTIONS: 97110-Therapeutic exercises, 97530- Therapeutic activity, 97112- Neuromuscular re-education, 97535- Self Care, [id number]- Manual therapy, [phone]- Gait training, [phone]- Aquatic Therapy, 97014- Electrical stimulation (unattended), [phone]- Electrical stimulation (manual), 97016- Vasopneumatic device, 97035- Ultrasound, Patient/Family education, Balance training, Taping, Joint mobilization, Joint manipulation, Scar mobilization, Vestibular training, and Cryotherapy  PLAN FOR NEXT SESSION:  work on shoulder stabilization in weight bearing, add plank + shoulder taps to HEP  Mliss Cummins, PT  02/06/24 9:35 AM   [facility]  Specialty Rehab Services [address] [place], [place] [address] Phone # [phone] Fax [phone]

## 2024-02-06 ENCOUNTER — Encounter: Payer: Self-pay | Admitting: Physical Therapy

## 2024-02-06 ENCOUNTER — Ambulatory Visit: Admitting: Physical Therapy

## 2024-02-06 DIAGNOSIS — M25612 Stiffness of left shoulder, not elsewhere classified: Secondary | ICD-10-CM

## 2024-02-06 DIAGNOSIS — M25512 Pain in left shoulder: Secondary | ICD-10-CM | POA: Diagnosis not present

## 2024-02-06 DIAGNOSIS — M6281 Muscle weakness (generalized): Secondary | ICD-10-CM

## 2024-02-06 DIAGNOSIS — R252 Cramp and spasm: Secondary | ICD-10-CM

## 2024-02-06 DIAGNOSIS — G8929 Other chronic pain: Secondary | ICD-10-CM

## 2024-02-11 ENCOUNTER — Ambulatory Visit: Attending: Orthopedic Surgery

## 2024-02-11 DIAGNOSIS — G8929 Other chronic pain: Secondary | ICD-10-CM | POA: Insufficient documentation

## 2024-02-11 DIAGNOSIS — M25512 Pain in left shoulder: Secondary | ICD-10-CM | POA: Insufficient documentation

## 2024-02-11 DIAGNOSIS — R252 Cramp and spasm: Secondary | ICD-10-CM | POA: Diagnosis present

## 2024-02-11 DIAGNOSIS — M6281 Muscle weakness (generalized): Secondary | ICD-10-CM | POA: Insufficient documentation

## 2024-02-11 DIAGNOSIS — M25612 Stiffness of left shoulder, not elsewhere classified: Secondary | ICD-10-CM | POA: Diagnosis present

## 2024-02-11 DIAGNOSIS — R293 Abnormal posture: Secondary | ICD-10-CM | POA: Insufficient documentation

## 2024-02-11 NOTE — Therapy (Signed)
 OUTPATIENT PHYSICAL THERAPY UPPER EXTREMITY TREATMENT    Patient Name: Jesse Stewart MRN: [id number] DOB:May 22, 1965, 58 y.o., male Today's Date: 02/11/2024  END OF SESSION:  PT End of Session - 02/11/24 0922     Visit Number 14    Authorization Type [facility]- 15 visits approved    Authorization - Visit Number 14    Authorization - Number of Visits 15    PT Start Time 0843    PT Stop Time 0922    PT Time Calculation (min) 39 min    Activity Tolerance Patient tolerated treatment well    Behavior During Therapy [facility] for tasks assessed/performed                       Past Medical History:  Diagnosis Date   Arthritis    Hypertension    Lung cancer (HCC) 03/13/2022   Past Surgical History:  Procedure Laterality Date   KNEE ARTHROSCOPY W/ MENISCAL REPAIR     LUNG REMOVAL, PARTIAL Right 03/27/2022   TOTAL KNEE ARTHROPLASTY Left 04/29/2023   Patient Active Problem List   Diagnosis Date Noted   Colon polyps 05/16/2019   Obesity (BMI 30.0-34.9) 05/13/2019   Chest tightness 10/06/2014   COVID 10/06/2014   Hematochezia 12/07/2012   Hernia of abdominal wall 12/07/2012   Well adult exam 04/23/2012   Neoplasm of uncertain behavior of skin 05/04/2010   ACTINIC KERATOSIS 05/04/2010   Dyslipidemia 04/04/2008   GERD 04/04/2008   Osteoarthritis 04/04/2008   KNEE PAIN 04/04/2008   ABNORMAL GLUCOSE NEC 04/04/2008   ABNORMAL LIVER FUNCTION TESTS 04/04/2008   Essential hypertension 12/29/2006    PCP: Garald Karlynn GAILS, MD   REFERRING PROVIDER: Lanell Royals, PA  REFERRING DIAG: Left Adhesive capsulitis  THERAPY DIAG:  Pain in Left shoulder  Rationale for Evaluation and Treatment: Rehabilitation  ONSET DATE: 1 month ago  SUBJECTIVE:   SUBJECTIVE STATEMENT: [facility]   Eval: Pt states he received PT for his left shoulder earlier this year, that improved his mobility and strength. However, recently within  past month pt is experiencing a flare up of pain along with significantly decreased left shoulder ROM. Pt returned back to his PCP for a check up. PCP recommended he return back to PT in hopes pt does not require surgery. Pt stated he does not want surgery. He states he has difficulty and pain reaching left arm behind his back and has night pain that wakes him up. He states he sleeps on his left side.    PERTINENT HISTORY: Lt TKA 04/29/23, Lung cancer with lung resection 03/2022 PAIN: 02/04/24:  Are you having pain? Yes: NPRS scale: 0/10 Pain location: Anterior shoulder, shoots down  Pain description: sharp, shooting  Aggravating factors: sleep at night, stretching arm Relieving factors: ibuprofen , aloxican hasn't taken, cream doc gave for post op knee   PRECAUTIONS: Other: history of lung cancer   RED FLAGS: None   WEIGHT BEARING RESTRICTIONS: No  FALLS:  Has patient fallen in last 6 months? 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   PLOF: Independent, Vocation/Vocational requirements: see above, and Leisure: hike, walk, beach  PATIENT GOALS: reach all the way over and get rid of the pain as much as possible  Diagnostic: MRI on 06/27/23:  IMPRESSION:  1. Articular-sided partial tear of the supraspinatus tendon approaching 50% thickness, measuring 2 cm anterior-posterior. Background of tendinosis and surface fraying.  2. Subscapularis tendinosis and low-grade partial tear.  3. Intra-articular biceps tendinosis, possible intrasubstance partial tear near the biceps-labral complex.  4. Imaging findings which may be seen with adhesive capsulitis.  5. Moderate AC joint  arthrosis.   NEXT MD VISIT: November 2025  OBJECTIVE:  Note: Objective measures were completed at Evaluation unless otherwise noted.  COGNITION: Overall cognitive status: Within functional limits for tasks assessed     SENSATION: WFL   POSTURE: Rounded shoulders and forward head posture  PALPATION: Muscle tightness in bilateral upper traps, rotator cuff muscles on left side  joint hypomobility with A-P glides, and inferior glides to left shoulder Decreased scapular mobility on left side UPPER EXTREMITY ROM:  AROM Left  Left  8/20 post manual (seated) Lt  01/07/24 Lt Lt  01/15/24  Flexion  128 134 130- reduced scapular elevation 135 sitting 146 supine Sitting 135  Abduction* 90 95 105    IR Lacking 8 inches vs Rt   Lacking 6 inch vs Rt (post stretches) about L5 Lacking 6 inches vs Rt   ER Lacking 6 inches vs Rt       AROM Left  02/06/24  Flexion  Sitting 130 Supine 150  Abduction*   IR Lacks 7 inch compared to R  ER     PROM   Left: Flexion=116, Abduction*=80    (Blank rows = not tested)  UPPER EXTREMITY MMT:  MMT Right eval Left eval Left 01/23/24 Left 02/06/24  Shoulder flexion   4 4 4+ 5-  Shoulder Abd* 4 4- 4+ 4+  ER 4 4- 4+ 5  IR 4 4- 5   Extension 4 4- 5                                              (Blank rows = not tested)                                                                                                                    TREATMENT DATE:  02/06/24 Arm bike: level 3x 8 min (4/4)-PT present to discuss progress  Finger ladder with 3# added to wrist: flexion and scaption x10 each  Lat pull down: 40# x20 Row: 20# x20 Standing IR with dowel behind back horizontally x 10 cues for upright posture Side lying ER 3 sec hold and slow lower,  flex and ABD with 3 lbs  2x 10 left - demos some scapular dyskinesia with ADDuction of shoulder Side lying T and Y 3# 2 x 10 no pain 3-way reach yellow loop x 10 L side  counter push ups 2  x  10 Quadruped thoracic rotation for stabilization through L shoulder x 10  3 way ball roll outs x10 each   02/06/24 Arm bike: level 3x 8 min (4/4)-PT present to discuss progress  Finger ladder with 3# added to wrist: flexion and scaption x10 each  Lat pull down: 40# x20 Row: 20# x20 Standing IR with dowel behind back horizontally x 10 cues for upright posture Side lying ER 3 sec hold and slow lower,  flex and ABD with 3 lbs  2x 10 left - demos some scapular dyskinesia with ADDuction of shoulder Side lying T and Y 3# 2 x 10 no pain 3-way reach yellow loop x 10 L side Wall push ups x10, counter push ups x 10 Quadruped thoracic rotation for stabilization through L shoulder x 10  Quadruped left shoulder slide outs into flexion x 5 - stopped due to ROM restrictions ROM and MMT   02/04/24 Arm bike: level 2x 8 min (4/4)-PT present to discuss progress  Finger ladder with 2# added to wrist: flexion and scaption x10 each  Lat pull down: 40# x20 Row: 20# x20 Standing IR with dowel behind back horizontally x 10 cues for upright posture - also discussed other options using objects to maintain position  prone shoulder ext 10#, rows (10#) 2x20 Side lying ER, flex and ABD with 3 lbs  2x 10 left Side lying T and Y x 10 no pain Manual: P/ROM and joint mobs to Lt shoulder in all directions    PATIENT EDUCATION:  Education details: Access Code: [email or web address] Person educated: Patient Education method: Explanation, Demonstration, and Handouts Education comprehension: verbalized understanding and returned demonstration  HOME EXERCISE PROGRAM:  Access Code: [email or web address] URL: [email or web address] Date: 01/09/2024 Prepared by: Mliss  Exercises - Seated Shoulder Flexion AAROM with Pulley Behind  - 3 x daily - 7 x weekly - 3 sets - 10 reps - Seated Shoulder Scaption AAROM with Pulley at Side  - 3 x daily - 7 x weekly - 3 sets - 10 reps - Seated Shoulder Internal Rotation AAROM with Pulley  - 1  x daily - 7 x weekly - 3 sets - 10 reps - Shoulder Flexion Wall Slide with Towel  - 2 x daily - 7 x weekly - 1 sets - 10 reps - 10 hold - Standing Shoulder Abduction Slides at Wall  - 1 x daily - 7 x weekly - 1 sets - 10 reps - 10 hold - Supine Shoulder External Rotation with Dowel  - 2 x daily - 7 x weekly - 2 sets - 10 reps - Standing Sleeper Stretch at [employer]  - 1 x daily - 3 x weekly - 1 sets - 3 reps - 30 hold - Sleeper Stretch  - 2 x daily - 7 x weekly - 1 sets - 3 reps - 30-60 sec hold - Sidelying Posterior Cuff Cross Body Stretch - 1/4 Turn Back  - 1 x daily - 7 x weekly - 1 sets - 5 reps - 10 sec hold - Prone Shoulder Extension - Single Arm  - 2 x daily - 7 x weekly - 2 sets - 10 reps - Prone Shoulder Row  - 2 x daily - 7 x weekly - 2 sets - 10 reps - Prone Single Arm Shoulder Horizontal Abduction with Scapular Retraction and Palm Down  - 2 x daily - 7 x weekly - 2 sets - 10 reps - Sidelying Shoulder External Rotation  - 2 x daily - 7  x weekly - 2 sets - 10 reps - Single Arm Serratus Punches in Supine with Dumbbell  - 2 x daily - 7 x weekly - 2 sets - 10 reps - Standing Bilateral Shoulder Internal Rotation AAROM with Dowel  - 2 x daily - 7 x weekly - 2 sets - 10 reps - Sidelying Open Book  - 2 x daily - 7 x weekly - 1 sets - 10 reps - 5 sec hold   ASSESSMENT:  CLINICAL IMPRESSION: Pt has made steady gains with PT.  He only experiences pain with reaching out to the side.  He plans to be consistent with HEP after D/C to reduce likelihood of his symptoms returning. He is limited with IR behind the back although the ease of movement is improved with reduced pain.   PT monitored throughout session for technique and cueing.  1 more session probable.    OBJECTIVE IMPAIRMENTS: Muscle spasm, decreased shoulder mobility, decreased shoulder strength, postural dysfunction, left shoulder pain, decreased functional capacity, pain with reaching behind back,   ACTIVITY LIMITATIONS: Reaching over head,  abducting left shoulder beyond 90 deg, reaching behind back, sleeping  PARTICIPATION LIMITATIONS: cleaning, driving, community activity, occupation, and yard work  PERSONAL FACTORS: 1-2 comorbidities: Lt TKA, lung cancer  are also affecting patient's functional outcome.   REHAB POTENTIAL: Good  CLINICAL DECISION MAKING: Stable/uncomplicated  EVALUATION COMPLEXITY: Low   GOALS: Goals reviewed with patient? Yes  SHORT TERM GOALS: Target date: 01/23/2024   Be independent in initial HEP Baseline: Goal status: MET  2.  Demonstrate Lt shouler A/ROM flexion to > or = to 140 degrees to allow for reaching overhead to paint and put dishes away in cabinets.  Baseline: 130 Goal status: partially met  3.  Improve Lt shoulder A/ROM IR to lacking < or = to 5 inches vs the Rt to improve self-care Baseline: 6-7 difference (02/06/24) Goal status: partially met   4.   Improve strength 4+/5 to lift and carry objects overhead pain free.  Baseline: 4-, no pain with lifting (01/21/24) Goal status: partially MET 9/12 (4+ to 5/5)    LONG TERM GOALS: Target date: 02/18/2024     Be independent in advanced HEP Baseline:  Goal status: In progress   2.  Improve Lt shoulder A/ROM ER to lacking < or = to 3 inches vs the Rt to improve self-care Baseline: 6-7 inches (02/06/24) Goal status: partially met  3.  Demonstrate Lt Shouler A/ROM flexion to > or = to 150 degrees to allow reaching overhead to paint without compensation  Baseline: 135 (01/15/24) Goal status: In progress   4.  Demonstrate symmetry with bilateral AROM Abduction without compensation due to increased strength and ROM Baseline:  Goal status: INITIAL  5.  Improve strength in left shoulder to complete house work such as fixing overhead lights, paint, etc with > or = to 50% less Lt UE pain Baseline: 4-  and 3/10 pain Goal status: MET  01/23/24  6.  Reach behind back WNL to be able to reach for seat behind while driving, reach for  wallet in back pocket.  Baseline: lacking 8 inches Goal status: INITIAL   PLAN:  PT FREQUENCY: 2x/week  PT DURATION: 8 weeks  PLANNED INTERVENTIONS: 97110-Therapeutic exercises, 97530- Therapeutic activity, [id number]- Neuromuscular re-education, 97535- Self Care, [id number]- Manual therapy, [id number]- Gait training, [phone]- Aquatic Therapy, [id number]- Electrical stimulation (unattended), [id number]- Electrical stimulation (manual), [id number]- Vasopneumatic device, [id number]- Ultrasound, Patient/Family education, Balance training, Taping, Joint mobilization, Joint manipulation, Scar  mobilization, Vestibular training, and Cryotherapy  PLAN FOR NEXT SESSION: finalize HEP, add plank + shoulder taps to HEP- plan D/C to HEP next session  Burnard Joy, PT 02/11/24 9:22 AM   [facility] [address], [address] [place], [place] [address] Phone # [phone] Fax [phone]

## 2024-02-13 ENCOUNTER — Ambulatory Visit: Admitting: Physical Therapy

## 2024-02-17 ENCOUNTER — Encounter: Admitting: Physical Therapy
